# Patient Record
Sex: Female | Born: 1945 | Race: Black or African American | Hispanic: No | Smoking: Never smoker
Health system: Southern US, Community
[De-identification: ages and names within clinical notes are randomized; demographics above are authoritative.]

## PROBLEM LIST (undated history)

## (undated) DIAGNOSIS — E669 Obesity, unspecified: Secondary | ICD-10-CM

## (undated) DIAGNOSIS — R931 Abnormal findings on diagnostic imaging of heart and coronary circulation: Secondary | ICD-10-CM

## (undated) DIAGNOSIS — I10 Essential (primary) hypertension: Secondary | ICD-10-CM

## (undated) DIAGNOSIS — I471 Supraventricular tachycardia: Secondary | ICD-10-CM

## (undated) DIAGNOSIS — I4719 Other supraventricular tachycardia: Secondary | ICD-10-CM

## (undated) DIAGNOSIS — J45909 Unspecified asthma, uncomplicated: Secondary | ICD-10-CM

## (undated) DIAGNOSIS — F32A Depression, unspecified: Secondary | ICD-10-CM

## (undated) DIAGNOSIS — T7840XA Allergy, unspecified, initial encounter: Secondary | ICD-10-CM

## (undated) DIAGNOSIS — I509 Heart failure, unspecified: Secondary | ICD-10-CM

## (undated) DIAGNOSIS — I429 Cardiomyopathy, unspecified: Secondary | ICD-10-CM

## (undated) DIAGNOSIS — F329 Major depressive disorder, single episode, unspecified: Secondary | ICD-10-CM

## (undated) DIAGNOSIS — E785 Hyperlipidemia, unspecified: Secondary | ICD-10-CM

## (undated) DIAGNOSIS — G4733 Obstructive sleep apnea (adult) (pediatric): Secondary | ICD-10-CM

## (undated) DIAGNOSIS — K635 Polyp of colon: Secondary | ICD-10-CM

## (undated) HISTORY — DX: Unspecified asthma, uncomplicated: J45.909

## (undated) HISTORY — DX: Heart failure, unspecified: I50.9

## (undated) HISTORY — DX: Polyp of colon: K63.5

## (undated) HISTORY — DX: Obstructive sleep apnea (adult) (pediatric): G47.33

## (undated) HISTORY — DX: Depression, unspecified: F32.A

## (undated) HISTORY — DX: Major depressive disorder, single episode, unspecified: F32.9

## (undated) HISTORY — DX: Essential (primary) hypertension: I10

## (undated) HISTORY — DX: Allergy, unspecified, initial encounter: T78.40XA

## (undated) HISTORY — DX: Supraventricular tachycardia: I47.1

## (undated) HISTORY — DX: Other supraventricular tachycardia: I47.19

## (undated) HISTORY — DX: Abnormal findings on diagnostic imaging of heart and coronary circulation: R93.1

## (undated) HISTORY — DX: Hyperlipidemia, unspecified: E78.5

## (undated) HISTORY — DX: Obesity, unspecified: E66.9

## (undated) HISTORY — DX: Cardiomyopathy, unspecified: I42.9

---

## 1965-01-13 HISTORY — PX: LEFT OOPHORECTOMY: SHX1961

## 1965-01-13 HISTORY — PX: TUBAL LIGATION: SHX77

## 1998-06-12 ENCOUNTER — Other Ambulatory Visit: Payer: Self-pay | Admitting: Cardiology

## 1998-06-13 ENCOUNTER — Other Ambulatory Visit: Payer: Self-pay | Admitting: Cardiology

## 2003-02-08 ENCOUNTER — Encounter: Payer: Self-pay | Admitting: Cardiology

## 2008-10-03 ENCOUNTER — Ambulatory Visit: Payer: Self-pay | Admitting: Occupational Medicine

## 2008-10-03 DIAGNOSIS — J45909 Unspecified asthma, uncomplicated: Secondary | ICD-10-CM | POA: Insufficient documentation

## 2008-10-03 DIAGNOSIS — I10 Essential (primary) hypertension: Secondary | ICD-10-CM

## 2008-12-15 ENCOUNTER — Ambulatory Visit: Payer: Self-pay | Admitting: Family Medicine

## 2008-12-15 DIAGNOSIS — J309 Allergic rhinitis, unspecified: Secondary | ICD-10-CM | POA: Insufficient documentation

## 2008-12-15 DIAGNOSIS — I509 Heart failure, unspecified: Secondary | ICD-10-CM | POA: Insufficient documentation

## 2008-12-15 DIAGNOSIS — Z780 Asymptomatic menopausal state: Secondary | ICD-10-CM | POA: Insufficient documentation

## 2008-12-15 DIAGNOSIS — H918X9 Other specified hearing loss, unspecified ear: Secondary | ICD-10-CM

## 2008-12-15 DIAGNOSIS — E669 Obesity, unspecified: Secondary | ICD-10-CM

## 2008-12-27 ENCOUNTER — Encounter: Payer: Self-pay | Admitting: Family Medicine

## 2009-01-18 ENCOUNTER — Encounter: Payer: Self-pay | Admitting: Family Medicine

## 2009-02-09 ENCOUNTER — Encounter: Payer: Self-pay | Admitting: Family Medicine

## 2009-03-15 ENCOUNTER — Ambulatory Visit: Payer: Self-pay | Admitting: Family Medicine

## 2009-04-10 ENCOUNTER — Encounter: Admission: RE | Admit: 2009-04-10 | Discharge: 2009-04-10 | Payer: Self-pay | Admitting: Family Medicine

## 2009-04-27 ENCOUNTER — Encounter: Payer: Self-pay | Admitting: Family Medicine

## 2009-07-10 ENCOUNTER — Ambulatory Visit: Payer: Self-pay | Admitting: Family

## 2009-07-10 ENCOUNTER — Telehealth (INDEPENDENT_AMBULATORY_CARE_PROVIDER_SITE_OTHER): Payer: Self-pay | Admitting: *Deleted

## 2009-07-10 ENCOUNTER — Telehealth: Payer: Self-pay | Admitting: Family

## 2009-07-10 DIAGNOSIS — E785 Hyperlipidemia, unspecified: Secondary | ICD-10-CM

## 2009-07-10 DIAGNOSIS — F329 Major depressive disorder, single episode, unspecified: Secondary | ICD-10-CM

## 2009-07-10 DIAGNOSIS — F3289 Other specified depressive episodes: Secondary | ICD-10-CM | POA: Insufficient documentation

## 2009-07-10 LAB — CONVERTED CEMR LAB
ALT: 10 units/L (ref 0–35)
AST: 17 units/L (ref 0–37)
CO2: 25 meq/L (ref 19–32)
Calcium: 9.4 mg/dL (ref 8.4–10.5)
Creatinine, Ser: 0.67 mg/dL (ref 0.40–1.20)
HDL: 69 mg/dL (ref >39)
Potassium: 4.2 meq/L (ref 3.5–5.3)
Total Protein: 7.9 g/dL (ref 6.0–8.3)
Triglycerides: 42 mg/dL (ref <150)
VLDL: 8 mg/dL (ref 0–40)

## 2009-07-11 ENCOUNTER — Encounter: Payer: Self-pay | Admitting: Family

## 2009-07-31 ENCOUNTER — Ambulatory Visit: Payer: Self-pay | Admitting: Family

## 2009-10-18 ENCOUNTER — Encounter: Payer: Self-pay | Admitting: Family Medicine

## 2009-12-04 ENCOUNTER — Ambulatory Visit: Payer: Self-pay | Admitting: Family Medicine

## 2009-12-04 DIAGNOSIS — J45901 Unspecified asthma with (acute) exacerbation: Secondary | ICD-10-CM | POA: Insufficient documentation

## 2010-02-12 NOTE — Letter (Signed)
Summary: Unable to Contact Patient/[facility]  Unable to Contact Patient/[facility]   Imported By: Lanelle Bal 11/02/2009 16:10:13  _____________________________________________________________________  External Attachment:    Type:   Image     Comment:   External Document

## 2010-02-12 NOTE — Progress Notes (Signed)
Summary: Diagnosis code for lab work  Phone Note From Other Clinic   Caller: [facility] lab Call For: Aldous Housel Summary of Call: Darl Pikes in lab called and all codes given for this patient BMP you had them draw are not passing she has [facility] Medicare and says they are picky of what diagnosis to use. Please give more diagnosis Initial call taken by: Kathlene November,  July 10, 2009 2:46 PM  Follow-up for Phone Call        Per lab [employer] will not cover BNP with diagnosis code of CHF. Follow-up by: Lemont Fillers FNP,  July 10, 2009 3:29 PM

## 2010-02-12 NOTE — Consult Note (Signed)
Summary: [facility]  [facility]   Imported By: Lanelle Bal 02/15/2009 10:57:50  _____________________________________________________________________  External Attachment:    Type:   Image     Comment:   External Document

## 2010-02-12 NOTE — Consult Note (Signed)
Summary: [facility]  [facility]   Imported By: Lanelle Bal 03/08/2009 13:09:22  _____________________________________________________________________  External Attachment:    Type:   Image     Comment:   External Document

## 2010-02-12 NOTE — Assessment & Plan Note (Signed)
Summary: BP check  Nurse Visit   Vital Signs:  Patient profile:   65 year old female Pulse rate:   63 / minute BP sitting:   125 / 72  (left arm) Cuff size:   large  Vitals Entered By: Kathlene November (July 31, 2009 1:17 PM) CC: Follow-up HTN HPI: Taking Meds?yes Side Effects?no Chest Pain, SOB, Dizziness?no A/P: HTN(401.1) At Goal? If no, MD will be notified. Follow-up in--  5 minutes was spent with the pt.  Allergies: No Known Drug Allergies  Orders Added: 1)  Est. Patient Level I [[address]]    Impression & Recommendations:  Problem # 1:  HYPERTENSION (ICD-401.9) BP looks great on current meds.   Her updated medication list for this problem includes:    Carvedilol 12.5 Mg Tabs (Carvedilol) .Marland Kitchen... 1 tab by mouth two times a day    Furosemide 20 Mg Tabs (Furosemide) .Marland Kitchen... 1 tab by mouth qd    Spironolactone 25 Mg Tabs (Spironolactone) .Marland Kitchen... Take one tablet by mouth once a day  Complete Medication List: 1)  Advair Diskus 250-50 Mcg/dose Aepb (Fluticasone-salmeterol) .... One puff two times a day 2)  Proair Hfa 108 (90 Base) Mcg/act Aers (Albuterol sulfate) .... As needed 3)  Carvedilol 12.5 Mg Tabs (Carvedilol) .Marland Kitchen.. 1 tab by mouth two times a day 4)  Furosemide 20 Mg Tabs (Furosemide) .Marland Kitchen.. 1 tab by mouth qd 5)  Simvastatin 20 Mg Tabs (Simvastatin) .... Take 1 by mouth at bedtime 6)  Loratadine 10 Mg Tabs (Loratadine) .Marland Kitchen.. 1 tab by mouth daily for allergies 7)  Spironolactone 25 Mg Tabs (Spironolactone) .... Take one tablet by mouth once a day

## 2010-02-12 NOTE — Assessment & Plan Note (Signed)
Summary: asthma / allergies   Vital Signs:  Patient profile:   65 year old female Height:      67 inches Weight:      250 pounds BMI:     39.30 O2 Sat:      100 % on Room air Temp:     98.6 degrees F oral Pulse rate:   74 / minute BP sitting:   114 / 70  (left arm) Cuff size:   large  Vitals Entered By: Payton Spark CMA (December 04, 2009 11:02 AM)  O2 Flow:  Room air CC: F/U.    Primary Care Provider:  Seymour Bars DO  CC:  F/U. Marland Kitchen  History of Present Illness: 65 yo AAF presents for f/u visit and flare up of her allergies.  she is using OTC allarest or benadyl with rare use of Loratidine.  She thinks that for the past month, her allergies and asthma has flared after moving to a new house.  The former owners had a cat and she has had her carpets cleaned but it has not helped.  She has had more congestion, dry cough, itchy watery eyes and SOB.  Reports compliance with her Advair and is rarely using her ProAir.  She also has CHF and is overdue to see cardiology back.  She is doing well on her current meds and her labs are UTD.  Denies chest pain, leg swelling, etc.    Current Medications (verified): 1)  Advair Diskus 250-50 Mcg/dose Aepb (Fluticasone-Salmeterol) .... One Puff Two Times A Day 2)  Proair Hfa 108 (90 Base) Mcg/act Aers (Albuterol Sulfate) .... As Needed 3)  Carvedilol 12.5 Mg Tabs (Carvedilol) .Marland Kitchen.. 1 Tab By Mouth Two Times A Day 4)  Furosemide 20 Mg Tabs (Furosemide) .Marland Kitchen.. 1 Tab By Mouth Qd 5)  Simvastatin 20 Mg Tabs (Simvastatin) .... Take 1 By Mouth At Bedtime 6)  Loratadine 10 Mg Tabs (Loratadine) .Marland Kitchen.. 1 Tab By Mouth Daily For Allergies 7)  Spironolactone 25 Mg Tabs (Spironolactone) .... Take One Tablet By Mouth Once A Day  Allergies (verified): No Known Drug Allergies  Past History:  Past Medical History: Asthma/ Allergies Hypertension CHF (Dr Eustace Quail) Obesity Depression  Social History: Reviewed history from 12/15/2008 and no changes  required. Retired in 2001 from [employer].  Previously divorced and widowed.  Not in any relationships now. Never smoked.  Denies ETOH. No exercise, fair diet. 46 yo grandson lives w/ her.  Review of Systems      See HPI  Physical Exam  General:  alert, well-developed, well-nourished, and well-hydrated.  obese Head:  normocephalic and atraumatic.   Eyes:  conjunctiva clear , no lid edema but eyes are watery Ears:  EACs patent; TMs translucent and gray with good cone of light and bony landmarks.  Nose:  boggy turbinates with nasal congestion Mouth:  o/p mildly injected Neck:  no masses.   Lungs:  prolonged expiratory phase with scant exp wheezing at the bases.  non labored. no rhonchi or rales Heart:  Normal rate and regular rhythm. S1 and S2 normal without gallop, murmur, click, rub or other extra sounds. Pulses:  2+ radial pulses Extremities:  1+ left pedal edema and 1+ right pedal edema.   Skin:  color normal.   Cervical Nodes:  No lymphadenopathy noted Psych:  flat affect.     Impression & Recommendations:  Problem # 1:  EXTRINSIC ASTHMA, WITH EXACERBATION (ICD-493.02) Mild asthma flare up secondary to allergies (likely from cat dander in  new house).  Will treat iwth Solumedrol 125 mg today followed by 7 days of Prednisone.  She is to stay on Advair with as needed use of ProAir.  Will update her spirometry thru [facility] given her allergy triggers. Her updated medication list for this problem includes:    Advair Diskus 250-50 Mcg/dose Aepb (Fluticasone-salmeterol) ..... One puff two times a day    Proair Hfa 108 (90 Base) Mcg/act Aers (Albuterol sulfate) .Marland Kitchen... As needed    Prednisone 20 Mg Tabs (Prednisone) .Marland Kitchen... 2 tabs by mouth once daily x 7 days  Orders: Allergy Referral  (Allergy)  Problem # 2:  ALLERGIC RHINITIS CAUSE UNSPECIFIED (ICD-477.9) Flare up of allergies/ asthma likely from cat dander.  Solumedrol 125 mg IM today followed by 7 days of oral prednisone  and stay on Claritin daily.  Referral made to [facility].  Likely will need allergy testing and PFTs done. Her updated medication list for this problem includes:    Loratadine 10 Mg Tabs (Loratadine) .Marland Kitchen... 1 tab by mouth daily for allergies  Orders: Allergy Referral  (Allergy) Admin of Therapeutic Inj  intramuscular or subcutaneous ([address])  Problem # 3:  CHF (ICD-428.0) She is overdue for f/u with [facility].  She is overdue for an echo.  Will get her scheduled for f/u with them.  RFd her meds today.  Labs are UTD. Her updated medication list for this problem includes:    Carvedilol 12.5 Mg Tabs (Carvedilol) .Marland Kitchen... 1 tab by mouth two times a day    Furosemide 20 Mg Tabs (Furosemide) .Marland Kitchen... 1 tab by mouth qd    Spironolactone 25 Mg Tabs (Spironolactone) .Marland Kitchen... Take one tablet by mouth once a day  Problem # 4:  OBESITY, UNSPECIFIED (ICD-278.00) BMI 39 c/w class II obesity. She does not seem interested in wt loss and has co- morbidities of CHF, asthma, HTN, dyslipidemia and depression.  Complete Medication List: 1)  Advair Diskus 250-50 Mcg/dose Aepb (Fluticasone-salmeterol) .... One puff two times a day 2)  Proair Hfa 108 (90 Base) Mcg/act Aers (Albuterol sulfate) .... As needed 3)  Carvedilol 12.5 Mg Tabs (Carvedilol) .Marland Kitchen.. 1 tab by mouth two times a day 4)  Furosemide 20 Mg Tabs (Furosemide) .Marland Kitchen.. 1 tab by mouth qd 5)  Simvastatin 20 Mg Tabs (Simvastatin) .... Take 1 by mouth at bedtime 6)  Loratadine 10 Mg Tabs (Loratadine) .Marland Kitchen.. 1 tab by mouth daily for allergies 7)  Spironolactone 25 Mg Tabs (Spironolactone) .... Take one tablet by mouth once a day 8)  Prednisone 20 Mg Tabs (Prednisone) .... 2 tabs by mouth once daily x 7 days  Other Orders: Solumedrol up to 125mg  (U0454)  Patient Instructions: 1)  Referral made to [facility] and All[facility]ies. 2)  Stay on ADVAIR 2 x a day 3)  use PROAIR 2 puffs 4 x a day for the next wk, then go to as needed. 4)  Take OTC  Zyrtec in the evenings for allergies instead of other anti histamines. 5)  Steroid shot given today.  Follow this with 7 days of Prednisone 40 mg once daily x 7 days-- start tomorrow. 6)  Return for f/u visit in 4 mos. Prescriptions: PREDNISONE 20 MG TABS (PREDNISONE) 2 tabs by mouth once daily x 7 days  #14 x 0   Entered and Authorized by:   Seymour Bars DO   Signed by:   Seymour Bars DO on 12/04/2009   Method used:   Electronically to  [facility] [address] [phone]* (retail)       [address] [place], [place]  [address]       Ph: [phone]       Fax: [phone]   RxID:   [id number] SPIRONOLACTONE 25 MG TABS (SPIRONOLACTONE) Take one tablet by mouth once a day  #30 x 6   Entered and Authorized by:   Seymour Bars DO   Signed by:   Seymour Bars DO on 12/04/2009   Method used:   Electronically to        [facility] [phone]* (retail)       [address] [place], [place]  [address]       Ph: [phone]       Fax: [phone]   RxID:   [id number] SIMVASTATIN 20 MG TABS (SIMVASTATIN) Take 1 by mouth at bedtime  #30 x 6   Entered and Authorized by:   Seymour Bars DO   Signed by:   Seymour Bars DO on 12/04/2009   Method used:   Electronically to        [facility] [phone]* (retail)       [address] [place], [place]  [address]       Ph: [phone]       Fax: [phone]   RxID:   [id number] FUROSEMIDE 20 MG TABS (FUROSEMIDE) 1 tab by mouth qd  #30 x 6   Entered and Authorized by:   Seymour Bars DO   Signed by:   Seymour Bars DO on 12/04/2009   Method used:   Electronically to        [facility] [phone]* (retail)       [address] [place], [place]  [address]       Ph: [phone]       Fax: [phone]   RxID:   [id number] CARVEDILOL 12.5 MG TABS (CARVEDILOL) 1 tab by mouth two times a day  #60 x 6   Entered and Authorized by:   Seymour Bars DO   Signed by:   Seymour Bars DO on 12/04/2009   Method used:   Electronically to         [facility] [phone]* (retail)       [address] [place], [place]  [address]       Ph: [phone]       Fax: [phone]   RxID:   [id number] PROAIR HFA 108 (90 BASE) MCG/ACT AERS (ALBUTEROL SULFATE) As needed  #2 x 1   Entered and Authorized by:   Seymour Bars DO   Signed by:   Seymour Bars DO on 12/04/2009   Method used:   Electronically to        [facility] [phone]* (retail)       [address] [place], [place]  [address]       Ph: [phone]       Fax: [phone]   RxID:   [id number] ADVAIR DISKUS 250-50 MCG/DOSE AEPB (FLUTICASONE-SALMETEROL) One puff two times a day  #1 diskus x 6   Entered and Authorized by:   Seymour Bars DO   Signed by:   Seymour Bars DO on 12/04/2009   Method used:   Electronically to  [address] [phone]* (retail)       [address] [place], [place]  [address]       Ph: [phone]       Fax: [phone]   RxID:   [id number]    Medication Administration  Injection # 1:    Medication: Solumedrol up to 125mg     Diagnosis: ALLERGIC RHINITIS CAUSE UNSPECIFIED (ICD-477.9)    Route: IM    Site: LUOQ gluteus    Exp Date: 03/2012    Lot #: [id number]    Patient tolerated injection without complications    Given by: Payton Spark CMA (December 04, 2009 11:31 AM)  Orders Added: 1)  Allergy Referral  [Allergy] 2)  Solumedrol up to 125mg  [J2930] 3)  Admin of Therapeutic Inj  intramuscular or subcutaneous [96372] 4)  Est. Patient Level IV [40347]     Medication Administration  Injection # 1:    Medication: Solumedrol up to 125mg     Diagnosis: ALLERGIC RHINITIS CAUSE UNSPECIFIED (ICD-477.9)    Route: IM    Site: LUOQ gluteus    Exp Date: 03/2012    Lot #: [id number]    Patient tolerated injection without complications    Given by: Payton Spark CMA (December 04, 2009 11:31 AM)  Orders Added: 1)  Allergy Referral  [Allergy] 2)  Solumedrol up to 125mg  [J2930] 3)  Admin of Therapeutic Inj  intramuscular or  subcutaneous [96372] 4)  Est. Patient Level IV [42595]
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
## 2010-02-12 NOTE — Letter (Signed)
   [facility] [address] [place], [place]  [address]  [place] Phone: [phone]      July 11, 2009   Loran Pepitone [address] [place], [place] [address]  RE:  LAB RESULTS  Dear  Ms. Cooner,  The following is an interpretation of your most recent lab tests.  Please take note of any instructions provided or changes to medications that have resulted from your lab work.  ELECTROLYTES:  Good - no changes needed  KIDNEY FUNCTION TESTS:  Good - no changes needed  LIVER FUNCTION TESTS:  Good - no changes needed  LIPID PANEL:  Good - no changes needed Triglyceride: 42   Cholesterol: 196   LDL: 119   HDL: 69   Chol/HDL%:  2.8 Ratio  DIABETIC STUDIES:  Excellent - no changes needed Blood Glucose: 92     Sincerely Yours,    Lemont Fillers FNP  Appended Document:  Letter mailed.

## 2010-02-12 NOTE — Letter (Signed)
Summary: Appt Rescheduled/[facility]  Appt Rescheduled/[facility]   Imported By: Lanelle Bal 05/03/2009 13:26:16  _____________________________________________________________________  External Attachment:    Type:   Image     Comment:   External Document

## 2010-02-12 NOTE — Progress Notes (Signed)
  Phone Note Outgoing Call   Summary of Call: Please call patient and verify dose of spironolactone that she has been taking most recently,  Dr. Tommye Standard records from last summer indicated that she was only taking 1/2 tablet of the 25 mg tab of spironlolactone.  If this is the case- she should continue with the half tab only. Thanks Initial call taken by: Lemont Fillers FNP,  July 10, 2009 4:52 PM  Follow-up for Phone Call        tried to call pt at 8:14am- message states VM can not accept messages at this time Follow-up by: Kathlene November,  July 11, 2009 8:15 AM  Additional Follow-up for Phone Call Additional follow up Details #1::        Pt states she has been taking 25mg  1 tab once daily bc the tabs are to hard to split.   Additional Follow-up by: Payton Spark CMA,  July 11, 2009 1:24 PM    Additional Follow-up for Phone Call Additional follow up Details #2::    OK for her to continue the 25mg  of spironolactone then, pls advise pt. Follow-up by: Lemont Fillers FNP,  July 11, 2009 3:54 PM

## 2010-05-16 ENCOUNTER — Encounter: Payer: Self-pay | Admitting: Family Medicine

## 2010-05-17 ENCOUNTER — Ambulatory Visit: Payer: Self-pay | Admitting: Family Medicine

## 2010-05-21 ENCOUNTER — Ambulatory Visit (INDEPENDENT_AMBULATORY_CARE_PROVIDER_SITE_OTHER): Payer: Medicare Other | Admitting: Family Medicine

## 2010-05-21 ENCOUNTER — Encounter: Payer: Self-pay | Admitting: Family Medicine

## 2010-05-21 DIAGNOSIS — J45909 Unspecified asthma, uncomplicated: Secondary | ICD-10-CM

## 2010-05-21 DIAGNOSIS — I509 Heart failure, unspecified: Secondary | ICD-10-CM

## 2010-05-21 DIAGNOSIS — E669 Obesity, unspecified: Secondary | ICD-10-CM

## 2010-05-21 DIAGNOSIS — I10 Essential (primary) hypertension: Secondary | ICD-10-CM

## 2010-05-21 NOTE — Assessment & Plan Note (Signed)
She needs to f/u with [facility] in [place] and agrees to call for an appt.  Weight loss is certainly important for her CHF.

## 2010-05-21 NOTE — Progress Notes (Signed)
  Subjective:    Patient ID: Briana Silva, female    DOB: 02-06-1945, 65 y.o.   MRN: [id number]  HPI 65 yo AAF presents for f/u visit.  I saw her in Nov and she has yet to go bck to the allergist or the cardiologist.  Her allergies have been bad but Zyrtec is working.  She is not needing her Albuterol HFA > 1 x a wk.  She is just getting over a cold.    Denies chest pain.  Has mild leg edema.  Denies DOE.  She is not taking her lasix everyday when she is out on the town.    She would like to lose weight and is feeling more motivated to do so.  Her BMI is up to 40 now in the class III obesity range.  She has not yet made any changes and has yet to start exercising.  BP 127/84  Pulse 93  Ht 5\' 7"  (1.702 m)  Wt 256 lb (116.121 kg)  BMI 40.10 kg/m2  SpO2 97%     Review of Systems  Denies chest pain, DOE.  Mild unchanged leg swellling.  Denies voiding problems or heart palpitations.    Objective:   Physical Exam  Constitutional: She appears well-developed and well-nourished. No distress.       Morbidly obese  Eyes: Conjunctivae are normal. No scleral icterus.  Neck: Neck supple. No JVD present.  Cardiovascular: Normal rate, regular rhythm and normal heart sounds.   No murmur heard. Pulmonary/Chest: Effort normal. No respiratory distress. She has wheezes (RUL mild exp wheeze). She has no rales.  Musculoskeletal: She exhibits edema (1+ pitting bilat LE edema).  Lymphadenopathy:    She has no cervical adenopathy.  Skin: Skin is warm and dry.  Psychiatric: She has a normal mood and affect.          Assessment & Plan:

## 2010-05-21 NOTE — Assessment & Plan Note (Signed)
BMI up from 39--> 40, now in the class III range.  She is more motivated to lose wt.  I talked to her about doing WT watchers or the [employer] (h/o given from [employer]).  She is also to start exercising.  Goals given.  F/u in 1 month.

## 2010-05-21 NOTE — Patient Instructions (Signed)
BP looks great.  F/U with the cardiologist for CHF.  Try Weight Watchers or the Mediterranean diet + exercise goal of 45+ min 4-5 days/ wk.  Return for f/u weight (? EKG) in 1 month.

## 2010-05-21 NOTE — Assessment & Plan Note (Signed)
Improved.  Zyrtec is working for allergies and she is much improved.  Continue current plan.

## 2010-05-21 NOTE — Assessment & Plan Note (Signed)
BP is well controlled on current meds and labs are UTD, continue.

## 2010-06-21 ENCOUNTER — Encounter: Payer: Self-pay | Admitting: Family Medicine

## 2010-06-21 ENCOUNTER — Ambulatory Visit (INDEPENDENT_AMBULATORY_CARE_PROVIDER_SITE_OTHER): Payer: Medicare Other | Admitting: Family Medicine

## 2010-06-21 DIAGNOSIS — E669 Obesity, unspecified: Secondary | ICD-10-CM

## 2010-06-21 DIAGNOSIS — I10 Essential (primary) hypertension: Secondary | ICD-10-CM

## 2010-06-21 DIAGNOSIS — I509 Heart failure, unspecified: Secondary | ICD-10-CM

## 2010-06-21 NOTE — Assessment & Plan Note (Signed)
She is clinically stable on current meds but overdue for cardiology f/u which she thinks is with [facility] cardiology.  Will draw her fasting labs (if not done by cards) at her next visit in 3 mos.  Will proceed with sleep testing given her BMI >40, HTN and CHF.  She is not very motivated to make other changes at this time.  Her BP is stable.

## 2010-06-21 NOTE — Assessment & Plan Note (Signed)
BMI >40, wt unchanged from last month.  Not a candidate for phentermine given her heart dz hx.  In additon, she is not motivated to make necessary lifestyle changes.  Recommended wt watchers.

## 2010-06-21 NOTE — Progress Notes (Signed)
  Subjective:    Patient ID: Briana Silva, female    DOB: 06/18/1945, 65 y.o.   MRN: [id number]  HPI  65 yo AAF presents for f/u visit.  She is doing well on her meds.  She is due for cardiology f/u.  She has hx of CHF.  She has never had a sleep study.  Her BMI remains >40.  Her BP is well controlled.  She has cut back on sugar and white flour.  She has started to exercise more and is noticing early change in her clothes.  She is doing stretches.  She uses Advair for asthma and has been able to exercise w/o problems.  She is not really motivated to make any other changes at this time.  Denies CP or DOE.  BP 123/70  Pulse 73  Ht 5' 6.75" (1.695 m)  Wt 255 lb (115.667 kg)  BMI 40.24 kg/m2  SpO2 96%   Review of Systems  Constitutional: Negative for appetite change and fatigue.  Eyes: Negative for visual disturbance.  Respiratory: Negative for shortness of breath.   Cardiovascular: Negative for chest pain, palpitations and leg swelling.  Psychiatric/Behavioral: Negative for dysphoric mood. The patient is not nervous/anxious.        Objective:   Physical Exam  Constitutional: She appears well-developed and well-nourished. No distress.       obese  HENT:  Head: Normocephalic and atraumatic.  Neck: Neck supple. No thyromegaly present.  Cardiovascular: Normal rate, regular rhythm and normal heart sounds.   No murmur heard. Pulmonary/Chest: Effort normal and breath sounds normal. No respiratory distress. She has no rales.  Musculoskeletal: She exhibits edema (minimial nonpitting LE edema bilat).  Skin: Skin is warm and dry.  Psychiatric: She has a normal mood and affect.          Assessment & Plan:

## 2010-06-21 NOTE — Patient Instructions (Signed)
Stay on current meds.  Will schedule your sleep study in [place].  F/U with your cardiologist.   Return for PHYSICAL with PAP/ fasting labs in 3 mos.

## 2010-08-08 ENCOUNTER — Telehealth: Payer: Self-pay | Admitting: Family Medicine

## 2010-08-08 DIAGNOSIS — I509 Heart failure, unspecified: Secondary | ICD-10-CM

## 2010-08-08 DIAGNOSIS — G4733 Obstructive sleep apnea (adult) (pediatric): Secondary | ICD-10-CM | POA: Insufficient documentation

## 2010-08-08 NOTE — Telephone Encounter (Signed)
Pls let pt know that her sleep study shows mild obstructive sleep apena with moderate snoring and disrupted sleep.   I will have her seen Dr Gaetano Net in the office to discuss CPAP for treatment.  Weight loss is also important.

## 2010-08-08 NOTE — Telephone Encounter (Signed)
Referral put in but this may not be covered since she is not diabetic.  If it's not, she can attend the free class here.

## 2010-08-08 NOTE — Telephone Encounter (Signed)
Advised pt of sleep study results and rec. Re: wt loss pt states she doesn't want to do weight watchersdue to not having family support near by,etc. But would like to see a dietician/nutritionist.referral?

## 2010-09-04 ENCOUNTER — Other Ambulatory Visit: Payer: Self-pay | Admitting: Family Medicine

## 2010-09-13 ENCOUNTER — Encounter: Payer: Self-pay | Admitting: Family Medicine

## 2010-09-23 ENCOUNTER — Encounter: Payer: Medicare Other | Admitting: Family Medicine

## 2010-10-17 DIAGNOSIS — R635 Abnormal weight gain: Secondary | ICD-10-CM | POA: Insufficient documentation

## 2010-10-17 DIAGNOSIS — Z658 Other specified problems related to psychosocial circumstances: Secondary | ICD-10-CM | POA: Insufficient documentation

## 2010-10-18 ENCOUNTER — Ambulatory Visit (INDEPENDENT_AMBULATORY_CARE_PROVIDER_SITE_OTHER): Payer: Medicare Other | Admitting: Family Medicine

## 2010-10-18 ENCOUNTER — Encounter: Payer: Self-pay | Admitting: Family Medicine

## 2010-10-18 VITALS — BP 147/91 | HR 88 | Wt 263.0 lb

## 2010-10-18 DIAGNOSIS — E785 Hyperlipidemia, unspecified: Secondary | ICD-10-CM

## 2010-10-18 DIAGNOSIS — Z23 Encounter for immunization: Secondary | ICD-10-CM

## 2010-10-18 DIAGNOSIS — I509 Heart failure, unspecified: Secondary | ICD-10-CM

## 2010-10-18 DIAGNOSIS — Z1211 Encounter for screening for malignant neoplasm of colon: Secondary | ICD-10-CM

## 2010-10-18 DIAGNOSIS — Z1231 Encounter for screening mammogram for malignant neoplasm of breast: Secondary | ICD-10-CM

## 2010-10-18 DIAGNOSIS — Z0000 Encounter for general adult medical examination without abnormal findings: Secondary | ICD-10-CM

## 2010-10-18 NOTE — Patient Instructions (Addendum)
We will call you with your lab results.  Start a regular exercise program and make sure you are eating a healthy diet Try to eat 4 servings of dairy a day or take a calcium supplement (500mg  twice a day).

## 2010-10-18 NOTE — Progress Notes (Deleted)
  Subjective:    Patient ID: Briana Silva, female    DOB: Apr 23, 1945, 65 y.o.   MRN: [id number]  HPI    Review of Systems     Objective:   Physical Exam        Assessment & Plan:

## 2010-10-18 NOTE — Progress Notes (Signed)
  Subjective:     Briana Silva is a 65 y.o. female and is here for a comprehensive physical exam. The patient reports problems - she is doing well. Marland Kitchen  History   Social History  . Marital Status: Widowed    Spouse Name: N/A    Number of Children: 2  . Years of Education: N/A   Occupational History  . Retired.     Social History Main Topics  . Smoking status: Never Smoker   . Smokeless tobacco: Not on file  . Alcohol Use: No  . Drug Use: No  . Sexually Active:    Other Topics Concern  . Not on file   Social History Narrative   No regular exercise.  Going to a nutrition center.    Health Maintenance  Topic Date Due  . Tetanus/tdap  07/04/1962  . Colonoscopy  07/03/1993  . Zostavax  07/04/2003  . Pneumococcal Polysaccharide Vaccine Age 71 And Over  07/03/2008  . Mammogram  04/11/2011  . Influenza Vaccine  10/14/2011    The following portions of the patient's history were reviewed and updated as appropriate: allergies, current medications, past family history, past medical history, past social history and past surgical history.  Review of Systems A comprehensive review of systems was negative.   Objective:    BP 147/91  Pulse 88  Wt 263 lb (119.296 kg) General appearance: alert, cooperative and appears stated age Head: Normocephalic, without obvious abnormality, atraumatic Eyes: conjunctiva clear, EOMI, PEERLA Ears: normal TM's and external ear canals both ears Nose: Nares normal. Septum midline. Mucosa normal. No drainage or sinus tenderness. Throat: lips, mucosa, and tongue normal; teeth and gums normal Neck: no adenopathy, no carotid bruit, supple, symmetrical, trachea midline and thyroid not enlarged, symmetric, no tenderness/mass/nodules Back: symmetric, no curvature. ROM normal. No CVA tenderness. Lungs: clear to auscultation bilaterally Breasts: normal appearance, no masses or tenderness Heart: regular rate and rhythm, S1, S2 normal, no murmur, click, rub or  gallop Abdomen: soft, non-tender; bowel sounds normal; no masses,  no organomegaly Pelvic: cervix normal in appearance, external genitalia normal, no adnexal masses or tenderness, no cervical motion tenderness, rectovaginal septum normal, uterus normal size, shape, and consistency and vagina normal without discharge Extremities: extremities normal, atraumatic, no cyanosis or edema Pulses: 2+ and symmetric Skin: Skin color, texture, turgor normal. No rashes or lesions Lymph nodes: Cervical, supraclavicular, and axillary nodes normal. Neurologic: Grossly normal    Assessment:    Healthy female exam.      Plan:     See After Visit Summary for Counseling Recommendations  Start a regular exercise program and make sure you are eating a healthy diet Try to eat 4 servings of dairy a day or take a calcium supplement (500mg  twice a day). Your vaccines are up to date.  Refer for mammogram and screening colonoscopy. I reviewed her vaccines with her. Seh thinks may have had her tetanus and pneumonia vaccines. seh doesn't have medicare part D so will hold off on shingles vaccine.

## 2010-10-19 LAB — COMPLETE METABOLIC PANEL WITH GFR
ALT: 10 U/L (ref 0–35)
Albumin: 4.2 g/dL (ref 3.5–5.2)
BUN: 12 mg/dL (ref 6–23)
CO2: 28 mEq/L (ref 19–32)
Creat: 0.78 mg/dL (ref 0.50–1.10)
GFR, Est African American: >60 mL/min (ref >60)
Glucose, Bld: 97 mg/dL (ref 70–99)
Sodium: 140 mEq/L (ref 135–145)

## 2010-10-19 LAB — LIPID PANEL
Cholesterol: 164 mg/dL (ref 0–200)
VLDL: 10 mg/dL (ref 0–40)

## 2010-10-21 ENCOUNTER — Telehealth: Payer: Self-pay | Admitting: *Deleted

## 2010-10-21 NOTE — Telephone Encounter (Signed)
Message copied by Lanae Crumbly on Mon Oct 21, 2010  9:34 AM ------      Message from: Nani Gasser D      Created: Sat Oct 19, 2010 11:30 AM       Cholesterol and CMP looks great!!

## 2010-10-21 NOTE — Telephone Encounter (Signed)
Pt notified of results. KJ LPN 

## 2010-11-20 ENCOUNTER — Ambulatory Visit: Payer: Medicare Other | Admitting: Family Medicine

## 2010-12-11 ENCOUNTER — Emergency Department
Admission: EM | Admit: 2010-12-11 | Discharge: 2010-12-11 | Disposition: A | Discharge status: Home / Self Care | Payer: Medicare Other | Source: Home / Self Care | Attending: Family Medicine | Admitting: Family Medicine

## 2010-12-11 ENCOUNTER — Encounter: Payer: Self-pay | Admitting: *Deleted

## 2010-12-11 DIAGNOSIS — J069 Acute upper respiratory infection, unspecified: Secondary | ICD-10-CM

## 2010-12-11 MED ORDER — BENZONATATE 200 MG PO CAPS: 200.0000 mg | ORAL_CAPSULE | Freq: Every day | ORAL | Status: AC

## 2010-12-11 MED ORDER — AMOXICILLIN 875 MG PO TABS: 875.0000 mg | ORAL_TABLET | Freq: Two times a day (BID) | ORAL | Status: AC

## 2010-12-11 NOTE — ED Notes (Signed)
After first attempt of blood draw with no success in left hand, pt. Declined any other attempts, stating had Colonoscopy this am and "is dry"./TM

## 2010-12-11 NOTE — ED Provider Notes (Signed)
History     CSN: [id number] Arrival date & time: 12/11/2010  1:46 PM   First MD Initiated Contact with Patient 12/11/10 1403      Chief Complaint  Patient presents with  . URI  . Cough     HPI Comments: Patient complains of approximately 6 day history of gradually progressive URI symptoms beginning with a mild sore throat (now improved), followed by progressive nasal congestion.  A cough started about 4 days ago.  Complains of fatigue and initial myalgias.  Cough is now worse at night and generally non-productive during the day.  She has had no pleuritic pain, but complains of shortness of breath and wheezes.  She has asthma and has been using her inhaler more frequently with marginal improvement.  She is also using her albuterol nebulizer. She states that she had her flu shot in October.  She notes that she sometimes coughs until she gags. She was preparing to undergo colonoscopy today, but the procedure was cancelled because of hypoxia.  She feels better now  Patient is a 65 y.o. female presenting with URI. The history is provided by the patient.  URI The primary symptoms include fever, fatigue, headaches, ear pain, sore throat, cough, wheezing and myalgias. Primary symptoms do not include swollen glands, abdominal pain, nausea, vomiting, arthralgias or rash. The current episode started 6 to 7 days ago. This is a new problem.  The headache is associated with eye pain.  The sore throat is not accompanied by trouble swallowing.  Symptoms associated with the illness include chills and congestion. The illness is not associated with sinus pressure. Risk factors for severe complications from URI include being elderly (History of pneumonia).    Past Medical History  Diagnosis Date  . Allergy   . Asthma   . CHF (congestive heart failure)   . Depression   . Obesity     Past Surgical History  Procedure Date  . Tubal ligation 1967  . Left oophorectomy 1967    Family History  Problem  Relation Age of Onset  . Pancreatic cancer Father   . Hypertension Mother   . Hypertension Father   . Diabetes Brother   . Cancer Daughter     Brain tumor    History  Substance Use Topics  . Smoking status: Never Smoker   . Smokeless tobacco: Not on file  . Alcohol Use: No    OB History    Grav Para Term Preterm Abortions TAB SAB Ect Mult Living                  Review of Systems  Constitutional: Positive for fever, chills, activity change and fatigue.  HENT: Positive for ear pain, congestion and sore throat. Negative for trouble swallowing and sinus pressure.   Eyes: Positive for pain.  Respiratory: Positive for cough and wheezing. Negative for shortness of breath.   Cardiovascular: Negative.   Gastrointestinal: Negative.  Negative for nausea, vomiting and abdominal pain.  Genitourinary: Negative.   Musculoskeletal: Positive for myalgias. Negative for arthralgias.  Skin: Negative.  Negative for rash.  Neurological: Positive for headaches.    Allergies  Review of patient's allergies indicates no known allergies.  Home Medications   Current Outpatient Rx  Name Route Sig Dispense Refill  . ALBUTEROL SULFATE HFA 108 (90 BASE) MCG/ACT IN AERS Inhalation Inhale 2 puffs into the lungs every 6 (six) hours as needed.      . AMOXICILLIN 875 MG PO TABS Oral Take 1 tablet (  875 mg total) by mouth 2 (two) times daily. 14 tablet 0  . BENZONATATE 200 MG PO CAPS Oral Take 1 capsule (200 mg total) by mouth at bedtime. Take as needed for cough 12 capsule 0  . CARVEDILOL 12.5 MG PO TABS  TAKE ONE TABLET BY MOUTH TWICE DAILY 60 tablet 2  . CETIRIZINE HCL 10 MG PO TABS Oral Take 10 mg by mouth daily.      Marland Kitchen FLUTICASONE-SALMETEROL 250-50 MCG/DOSE IN AEPB Inhalation Inhale 1 puff into the lungs every 12 (twelve) hours.      . FUROSEMIDE 20 MG PO TABS  TAKE ONE TABLET BY MOUTH EVERY DAY 30 tablet 2  . SIMVASTATIN 20 MG PO TABS  TAKE ONE TABLET BY MOUTH EVERY DAY AT BEDTIME 30 tablet 2  .  SPIRONOLACTONE 25 MG PO TABS  TAKE ONE TABLET BY MOUTH EVERY DAY 30 tablet 2    BP 151/95  Pulse 89  Temp(Src) 98 F (36.7 C) (Oral)  Resp 18  Ht 5\' 7"  (1.702 m)  Wt 262 lb (118.842 kg)  BMI 41.03 kg/m2  SpO2 95%  Physical Exam  Nursing note and vitals reviewed. Constitutional: She is oriented to person, place, and time. She appears well-developed and well-nourished. No distress.       Patient is obese (BMI 41.1)   HENT:  Head: Normocephalic and atraumatic.  Right Ear: Tympanic membrane and external ear normal.  Left Ear: Tympanic membrane and external ear normal.  Nose: Nose normal.  Mouth/Throat: Oropharynx is clear and moist. No oropharyngeal exudate.  Eyes: Conjunctivae are normal. Pupils are equal, round, and reactive to light. Right eye exhibits no discharge. Left eye exhibits no discharge.  Neck: Neck supple.  Cardiovascular: Normal rate, regular rhythm and normal heart sounds.   Pulmonary/Chest: Effort normal. No respiratory distress. She has no wheezes. She has rhonchi. She has no rales. She exhibits no tenderness.  Abdominal: Soft. There is no tenderness.  Musculoskeletal: She exhibits no edema and no tenderness.  Lymphadenopathy:    She has no cervical adenopathy.  Neurological: She is alert and oriented to person, place, and time.  Skin: Skin is warm and dry.    ED Course  Procedures  none      1. Acute upper respiratory infections of unspecified site       MDM   ? Bronchitis With history of pneumonia, will begin amoxicillin.  Also begin cough suppressant at bedtime.  Take Mucinex  (guaifenesin) twice daily for congestion.  Increase fluid intake, rest. May use Afrin nasal spray (or generic oxymetazoline) twice daily for about 5 days.  Also recommend using saline nasal spray several times daily and/or saline nasal irrigation. Stop all antihistamines for now, and other non-prescription cough/cold preparations. Continue inhalers. Follow-up with family  doctor if not improving 7 days.  Recommend postponing colonoscopy for about two weeks until completely well.       Donna Christen, MD 12/15/10 [phone]

## 2010-12-11 NOTE — ED Notes (Signed)
Patient c/o cough, wheezing, right ear pain and sinus drainage x 1 week. She has used her inhaler @ home and taken benadryl. Today she went for a colonoscopy and reported the procedure had to be stopped because her 02 level kept dropping too low.

## 2010-12-12 ENCOUNTER — Encounter: Payer: Self-pay | Admitting: Family Medicine

## 2010-12-26 ENCOUNTER — Other Ambulatory Visit: Payer: Self-pay | Admitting: *Deleted

## 2010-12-26 MED ORDER — SIMVASTATIN 20 MG PO TABS: 20.0000 mg | ORAL_TABLET | Freq: Every day | ORAL | Status: DC

## 2010-12-26 MED ORDER — SPIRONOLACTONE 25 MG PO TABS: 25.0000 mg | ORAL_TABLET | Freq: Every day | ORAL | Status: DC

## 2010-12-26 MED ORDER — ALBUTEROL SULFATE HFA 108 (90 BASE) MCG/ACT IN AERS: 2.0000 | INHALATION_SPRAY | Freq: Four times a day (QID) | RESPIRATORY_TRACT | Status: DC | PRN

## 2010-12-26 MED ORDER — FUROSEMIDE 20 MG PO TABS: 20.0000 mg | ORAL_TABLET | Freq: Every day | ORAL | Status: DC

## 2010-12-26 MED ORDER — FLUTICASONE-SALMETEROL 250-50 MCG/DOSE IN AEPB: 1.0000 | INHALATION_SPRAY | Freq: Two times a day (BID) | RESPIRATORY_TRACT | Status: DC

## 2011-01-03 ENCOUNTER — Other Ambulatory Visit: Payer: Self-pay | Admitting: *Deleted

## 2011-01-03 MED ORDER — SPIRONOLACTONE 25 MG PO TABS: 25.0000 mg | ORAL_TABLET | Freq: Every day | ORAL | Status: DC

## 2011-01-03 MED ORDER — FLUTICASONE-SALMETEROL 250-50 MCG/DOSE IN AEPB: 1.0000 | INHALATION_SPRAY | Freq: Two times a day (BID) | RESPIRATORY_TRACT | Status: DC

## 2011-01-03 MED ORDER — SIMVASTATIN 20 MG PO TABS: 20.0000 mg | ORAL_TABLET | Freq: Every day | ORAL | Status: DC

## 2011-04-22 ENCOUNTER — Other Ambulatory Visit: Payer: Self-pay | Admitting: *Deleted

## 2011-04-22 MED ORDER — FUROSEMIDE 20 MG PO TABS: 20.0000 mg | ORAL_TABLET | Freq: Every day | ORAL | Status: DC

## 2011-04-24 ENCOUNTER — Ambulatory Visit (INDEPENDENT_AMBULATORY_CARE_PROVIDER_SITE_OTHER): Payer: Medicare Other | Admitting: Family Medicine

## 2011-04-24 ENCOUNTER — Encounter: Payer: Self-pay | Admitting: Family Medicine

## 2011-04-24 VITALS — BP 148/87 | HR 91 | Ht 66.75 in | Wt 255.0 lb

## 2011-04-24 DIAGNOSIS — E785 Hyperlipidemia, unspecified: Secondary | ICD-10-CM

## 2011-04-24 DIAGNOSIS — Z1231 Encounter for screening mammogram for malignant neoplasm of breast: Secondary | ICD-10-CM

## 2011-04-24 DIAGNOSIS — I509 Heart failure, unspecified: Secondary | ICD-10-CM

## 2011-04-24 DIAGNOSIS — I10 Essential (primary) hypertension: Secondary | ICD-10-CM

## 2011-04-24 MED ORDER — SIMVASTATIN 20 MG PO TABS: 20.0000 mg | ORAL_TABLET | Freq: Every day | ORAL | Status: DC

## 2011-04-24 MED ORDER — FUROSEMIDE 40 MG PO TABS: 40.0000 mg | ORAL_TABLET | Freq: Every day | ORAL | Status: DC

## 2011-04-24 MED ORDER — CARVEDILOL 12.5 MG PO TABS: 12.5000 mg | ORAL_TABLET | Freq: Two times a day (BID) | ORAL | Status: DC

## 2011-04-24 MED ORDER — LISINOPRIL 20 MG PO TABS: 20.0000 mg | ORAL_TABLET | Freq: Every day | ORAL | Status: DC

## 2011-04-24 NOTE — Progress Notes (Addendum)
  Subjective:    Patient ID: Briana Silva, female    DOB: 23-Jan-1945, 66 y.o.   MRN: [id number]  HPI HTN - she is here to followup on blood pressure today. She has not seen her cardiologist in almost a year. She is on a stat refills which is why she came in. She denies any chest pain or shortness of breath. She says she has been taking some extra Lasix here in the year and has been running out early. She is to take 40 mg but this was running to the bathroom too much to have asked her to PT decreased to 20 mg. She now would like it to be re\re increased back to 40 mg. She did stop her strong lights on. She read the side effect profile and had some major concerns. She was not having any actual side effects on the medication better.  CHF - please see above and her hypertension.   Review of Systems     Objective:   Physical Exam  Constitutional: She is oriented to person, place, and time. She appears well-developed and well-nourished.  HENT:  Head: Normocephalic and atraumatic.  Cardiovascular: Normal rate, regular rhythm and normal heart sounds.   Pulmonary/Chest: Effort normal and breath sounds normal.  Neurological: She is alert and oriented to person, place, and time.  Skin: Skin is warm and dry.  Psychiatric: She has a normal mood and affect. Her behavior is normal.          Assessment & Plan:  HTN - Uncontrolled. We discussed that we have increased her Lasix back to 40 mg. Will add lisinopril to get her blood pressure under much better control. We discussed the risks and benefits and potential side effects of the lisinopril. I did encourage her to follow back up with her cardiologist to discuss her concerns about the spironolactone and to see if they feel that it is warranted or not. I think initially she thought it was for cholesterol but I explained her that her simvastatin is for her cholesterol.  Due for BMP today. Followup in 6 weeks.  Hyperlipidemia-she is due to recheck lipid  levels. Well controlled in October.   CHF - See HTN.  I did ask her to discuss her spironolactone with her cardiologist. She says she will try make an appointment sometime this spring. For now we will work on controlling her blood pressure.  I met her she is also overdue for her screening mammogram. We will make a referral.

## 2011-04-29 ENCOUNTER — Inpatient Hospital Stay: Admission: RE | Admit: 2011-04-29 | Payer: Medicare Other | Source: Ambulatory Visit

## 2011-05-06 ENCOUNTER — Ambulatory Visit
Admission: RE | Admit: 2011-05-06 | Discharge: 2011-05-06 | Disposition: A | Discharge status: Home / Self Care | Payer: Medicare Other | Source: Ambulatory Visit | Attending: Family Medicine | Admitting: Family Medicine

## 2011-05-06 DIAGNOSIS — Z1231 Encounter for screening mammogram for malignant neoplasm of breast: Secondary | ICD-10-CM

## 2011-06-03 LAB — LIPID PANEL
Cholesterol: 159 mg/dL (ref 0–200)
HDL: 76 mg/dL — AB (ref 35–70)
LDl/HDL Ratio: 74
Triglycerides: 45 mg/dL (ref 40–160)

## 2011-06-03 LAB — BASIC METABOLIC PANEL: Glucose: 103 mg/dL

## 2011-06-23 ENCOUNTER — Encounter: Payer: Self-pay | Admitting: *Deleted

## 2011-06-23 DIAGNOSIS — M8580 Other specified disorders of bone density and structure, unspecified site: Secondary | ICD-10-CM | POA: Insufficient documentation

## 2011-09-19 ENCOUNTER — Other Ambulatory Visit: Payer: Self-pay | Admitting: *Deleted

## 2011-09-19 MED ORDER — CARVEDILOL 12.5 MG PO TABS: 12.5000 mg | ORAL_TABLET | Freq: Two times a day (BID) | ORAL | Status: DC

## 2011-09-19 MED ORDER — FUROSEMIDE 40 MG PO TABS: 40.0000 mg | ORAL_TABLET | Freq: Every day | ORAL | Status: DC

## 2011-09-19 MED ORDER — ALBUTEROL SULFATE HFA 108 (90 BASE) MCG/ACT IN AERS: 2.0000 | INHALATION_SPRAY | Freq: Four times a day (QID) | RESPIRATORY_TRACT | Status: DC | PRN

## 2011-09-19 MED ORDER — SIMVASTATIN 20 MG PO TABS: 20.0000 mg | ORAL_TABLET | Freq: Every day | ORAL | Status: DC

## 2011-09-19 MED ORDER — FLUTICASONE-SALMETEROL 250-50 MCG/DOSE IN AEPB: 1.0000 | INHALATION_SPRAY | Freq: Two times a day (BID) | RESPIRATORY_TRACT | Status: DC

## 2011-09-19 MED ORDER — LISINOPRIL 20 MG PO TABS: 20.0000 mg | ORAL_TABLET | Freq: Every day | ORAL | Status: DC

## 2011-09-25 ENCOUNTER — Ambulatory Visit: Payer: Medicare Other | Admitting: Family Medicine

## 2011-09-26 ENCOUNTER — Telehealth: Payer: Self-pay | Admitting: Family Medicine

## 2011-09-26 ENCOUNTER — Encounter: Payer: Self-pay | Admitting: Family Medicine

## 2011-09-26 ENCOUNTER — Ambulatory Visit (INDEPENDENT_AMBULATORY_CARE_PROVIDER_SITE_OTHER): Payer: Medicare Other | Admitting: Family Medicine

## 2011-09-26 VITALS — BP 123/77 | HR 85 | Ht 66.75 in | Wt 259.0 lb

## 2011-09-26 DIAGNOSIS — G4730 Sleep apnea, unspecified: Secondary | ICD-10-CM

## 2011-09-26 DIAGNOSIS — Z23 Encounter for immunization: Secondary | ICD-10-CM

## 2011-09-26 DIAGNOSIS — J45901 Unspecified asthma with (acute) exacerbation: Secondary | ICD-10-CM

## 2011-09-26 DIAGNOSIS — I10 Essential (primary) hypertension: Secondary | ICD-10-CM

## 2011-09-26 DIAGNOSIS — Z293 Encounter for prophylactic fluoride administration: Secondary | ICD-10-CM

## 2011-09-26 LAB — COMPLETE METABOLIC PANEL WITH GFR
AST: 17 U/L (ref 0–37)
Albumin: 4 g/dL (ref 3.5–5.2)
Alkaline Phosphatase: 81 U/L (ref 39–117)
BUN: 18 mg/dL (ref 6–23)
Calcium: 9.4 mg/dL (ref 8.4–10.5)
Chloride: 102 mEq/L (ref 96–112)
GFR, Est Non African American: 68 mL/min
Glucose, Bld: 95 mg/dL (ref 70–99)
Potassium: 4.2 mEq/L (ref 3.5–5.3)
Sodium: 141 mEq/L (ref 135–145)
Total Protein: 7.6 g/dL (ref 6.0–8.3)

## 2011-09-26 LAB — LIPID PANEL
Cholesterol: 163 mg/dL (ref 0–200)
Total CHOL/HDL Ratio: 2.4 Ratio
VLDL: 9 mg/dL (ref 0–40)

## 2011-09-26 MED ORDER — CARVEDILOL 12.5 MG PO TABS: 12.5000 mg | ORAL_TABLET | Freq: Two times a day (BID) | ORAL | Status: DC

## 2011-09-26 MED ORDER — ALBUTEROL SULFATE HFA 108 (90 BASE) MCG/ACT IN AERS: 2.0000 | INHALATION_SPRAY | Freq: Four times a day (QID) | RESPIRATORY_TRACT | Status: DC | PRN

## 2011-09-26 MED ORDER — CICLESONIDE 37 MCG/ACT NA AERS: 2.0000 | INHALATION_SPRAY | Freq: Every day | NASAL | Status: DC

## 2011-09-26 MED ORDER — PREDNISONE 20 MG PO TABS: 40.0000 mg | ORAL_TABLET | Freq: Every day | ORAL | Status: AC

## 2011-09-26 MED ORDER — FLUTICASONE-SALMETEROL 250-50 MCG/DOSE IN AEPB: 1.0000 | INHALATION_SPRAY | Freq: Two times a day (BID) | RESPIRATORY_TRACT | Status: DC

## 2011-09-26 MED ORDER — ALBUTEROL SULFATE (2.5 MG/3ML) 0.083% IN NEBU
2.5000 mg | INHALATION_SOLUTION | Freq: Once | RESPIRATORY_TRACT | Status: AC
  Administered 2011-09-26: 2.5 mg via RESPIRATORY_TRACT

## 2011-09-26 MED ORDER — FUROSEMIDE 40 MG PO TABS: 40.0000 mg | ORAL_TABLET | Freq: Every day | ORAL | Status: DC

## 2011-09-26 MED ORDER — SIMVASTATIN 20 MG PO TABS: 20.0000 mg | ORAL_TABLET | Freq: Every day | ORAL | Status: DC

## 2011-09-26 MED ORDER — LISINOPRIL 20 MG PO TABS: 20.0000 mg | ORAL_TABLET | Freq: Every day | ORAL | Status: DC

## 2011-09-26 NOTE — Telephone Encounter (Signed)
Call patient: After looking at her sleep study I think she actually needs to have the CPAP titration done. I noticed that the recommendations were weight reduction as well as CPAP or possible oral appliance. If she's okay we will go ahead and schedule a CPAP titration at the sleep lab for [facility] neurologic. Please let me know.

## 2011-09-26 NOTE — Patient Instructions (Addendum)

## 2011-09-26 NOTE — Telephone Encounter (Addendum)
Pt called and states her rx that was sent in today is too expensive; called pt on Friday and she didn't say meds were too expensive she just wanted to know how much the cpap equipment would cost. Advised her that that is something she would have to check into with her insurance.Pt voiced understanding

## 2011-09-26 NOTE — Progress Notes (Signed)
  Subjective:    Patient ID: Briana Silva, female    DOB: 1945-10-09, 66 y.o.   MRN: [id number]  HPI This week her asthma has  Been flaring. She has had to use her proair 2-3 times per day over the last 3 days.  Says thinks it is seasonal. No URI or fever. She says she has lost her sense of smell. She does have some mild nasal congestion.  HTN - No CP or SOB.  She takes her lisinopril, Coreg and Lasix daily. No problems or side effects with the medications.  Sleep apnea-she would like [place] to review her CPAP supplies. Initially she felt that she didn't need them so never really followed up. But now she is ready to get them.  Review of Systems     Objective:   Physical Exam  Constitutional: She is oriented to person, place, and time. She appears well-developed and well-nourished.  HENT:  Head: Normocephalic and atraumatic.       Turbinates are very swollen.   Neck: Neck supple. No thyromegaly present.  Cardiovascular: Normal rate, regular rhythm and normal heart sounds.   Pulmonary/Chest: Effort normal. She has wheezes.       Diffuse wheezing on the right. Slight wheeze on the left upper lobe.  Lymphadenopathy:    She has no cervical adenopathy.  Neurological: She is alert and oriented to person, place, and time.  Skin: Skin is warm and dry.  Psychiatric: She has a normal mood and affect. Her behavior is normal.          Assessment & Plan:  OSA - Never got her CPAP supplies.  We will place a new order to home health for supplies. She also needs a new nebulizer machine. When she has it for 66 years old is very bulky and heavy and heart trouble with.  Asthma - not well controlled currently. She's currently having exacerbation. Her peak flow today is in the yellow zone. We gave her a neb treatment and she cannot about 40 points with her peak flow but still in the yellow. I offered a second treatment she says she will do at home she does have a nebulizer home. I encouraged her restart  her Zyrtec and I gave her a sample of Zetonna to take for her swollen nasal turbinates.Given short coaurse of steroids. She is worried about wweight gain. Dsicussed that it will increase her appetite, encouraged her to just mostly snack on fruit and vegetables. If she gets worse please call the office.  HTN - Well controlled. Continue current regimen. Followup in 6 months. Continue to work on diet and exercise. She's actually involved in a research study at [facility] and has been doing some exercise through them. Also gave her information on the DASH diet. Due for CMP and fasting lipid panel.  Flu vaccine given today.

## 2011-09-29 ENCOUNTER — Encounter: Payer: Self-pay | Admitting: *Deleted

## 2011-09-29 NOTE — Progress Notes (Signed)
Quick Note:  All labs are normal. ______ 

## 2011-10-15 ENCOUNTER — Encounter: Payer: Self-pay | Admitting: *Deleted

## 2011-10-15 ENCOUNTER — Emergency Department
Admission: EM | Admit: 2011-10-15 | Discharge: 2011-10-15 | Disposition: A | Discharge status: Home / Self Care | Payer: Medicare Other | Source: Home / Self Care | Attending: Family Medicine | Admitting: Family Medicine

## 2011-10-15 DIAGNOSIS — B029 Zoster without complications: Secondary | ICD-10-CM

## 2011-10-15 MED ORDER — VALACYCLOVIR HCL 1 G PO TABS: 1000.0000 mg | ORAL_TABLET | Freq: Three times a day (TID) | ORAL | Status: DC

## 2011-10-15 NOTE — ED Provider Notes (Signed)
History     CSN: [id number]  Arrival date & time 10/15/11  1005   First MD Initiated Contact with Patient 10/15/11 1050      Chief Complaint  Patient presents with  . Rash      HPI Comments: Patient complains of becoming fatigued about five days ago with myalgias.  About 3 days ago she developed a painful, burning rash on her right forehead.  She has now developed some irritation in her right eye.  She notes some burning sensation on her nose although she has not had a rash there.  Patient is a 66 y.o. female presenting with rash. The history is provided by the patient.  Rash  This is a new problem. Episode onset: 3 days ago. The problem has been gradually worsening. The problem is associated with nothing. There has been no fever. The rash is present on the face. The pain is mild. The pain has been constant since onset. Associated symptoms include blisters, itching and pain. Pertinent negatives include no weeping. She has tried antibiotic cream for the symptoms. The treatment provided no relief.    Past Medical History  Diagnosis Date  . Allergy   . Asthma   . CHF (congestive heart failure)   . Depression   . Obesity     Past Surgical History  Procedure Date  . Tubal ligation 1967  . Left oophorectomy 1967    Family History  Problem Relation Age of Onset  . Pancreatic cancer Father   . Hypertension Mother   . Hypertension Father   . Diabetes Brother   . Cancer Daughter     Brain tumor    History  Substance Use Topics  . Smoking status: Never Smoker   . Smokeless tobacco: Not on file  . Alcohol Use: No    OB History    Grav Para Term Preterm Abortions TAB SAB Ect Mult Living                  Review of Systems  Skin: Positive for itching and rash.  All other systems reviewed and are negative.    Allergies  Review of patient's allergies indicates no known allergies.  Home Medications   Current Outpatient Rx  Name Route Sig Dispense Refill  .  ALBUTEROL SULFATE HFA 108 (90 BASE) MCG/ACT IN AERS Inhalation Inhale 2 puffs into the lungs every 6 (six) hours as needed. 6.7 g 0  . CARVEDILOL 12.5 MG PO TABS Oral Take 1 tablet (12.5 mg total) by mouth 2 (two) times daily with a meal. 60 tablet 11  . CETIRIZINE HCL 10 MG PO TABS Oral Take 10 mg by mouth daily.      Marland Kitchen CICLESONIDE 37 MCG/ACT NA AERS Nasal Place 2 Squirts into the nose daily. 3 g 0  . FLAX SEED OIL 1000 MG PO CAPS Oral Take 1 capsule by mouth daily.    Marland Kitchen FLUTICASONE-SALMETEROL 250-50 MCG/DOSE IN AEPB Inhalation Inhale 1 puff into the lungs every 12 (twelve) hours. 60 each 5  . FUROSEMIDE 40 MG PO TABS Oral Take 1 tablet (40 mg total) by mouth daily. 90 tablet 3  . LISINOPRIL 20 MG PO TABS Oral Take 1 tablet (20 mg total) by mouth daily. 90 tablet 3  . FISH OIL 1000 MG PO CAPS Oral Take 1 capsule by mouth daily.    Marland Kitchen SIMVASTATIN 20 MG PO TABS Oral Take 1 tablet (20 mg total) by mouth at bedtime. 30 tablet 11  .  VALACYCLOVIR HCL 1 G PO TABS Oral Take 1 tablet (1,000 mg total) by mouth 3 (three) times daily. 21 tablet 0    BP 135/85  Pulse 81  Temp 98.7 F (37.1 C) (Oral)  Resp 14  Ht 5\' 7"  (1.702 m)  Wt 263 lb (119.296 kg)  BMI 41.19 kg/m2  SpO2 98%  Physical Exam  Nursing note and vitals reviewed. Constitutional: She is oriented to person, place, and time. She appears well-developed and well-nourished.       Patient is obese (BMI 41.2)   HENT:  Head: Normocephalic and atraumatic.    Nose: Nose normal.  Mouth/Throat: Oropharynx is clear and moist.       There is a herpetiform eruption right temple and forehead as noted on diagram  Eyes: EOM are normal. Pupils are equal, round, and reactive to light. Right conjunctiva is injected.       Right upper eyelid is slightly swollen and erythematous but non-tender. No photophobia  Neck: Neck supple.  Cardiovascular: Normal heart sounds.   Pulmonary/Chest: Breath sounds normal.  Abdominal: Soft. There is no tenderness.    Lymphadenopathy:    She has no cervical adenopathy.  Neurological: She is alert and oriented to person, place, and time.  Skin: Skin is warm and dry. Rash noted.    ED Course  Procedures  none      1. Shingles, with early right ocular involvement.       MDM  Begin Valtrex Followup with ophthalmologist as soon as possible.         Lattie Haw, MD 10/15/11 [phone]

## 2011-10-15 NOTE — ED Notes (Addendum)
Patient c/o rash to right side of face and right eye swelling. Also c/o right ear pain and congestion. Used antibiotic ointment on rash. Patient does have a hx of chicken pox.

## 2011-10-17 ENCOUNTER — Telehealth: Payer: Self-pay

## 2011-10-17 NOTE — ED Notes (Signed)
Rochell is doing about the same. She did see the ophthalmologist.

## 2011-10-27 ENCOUNTER — Ambulatory Visit (INDEPENDENT_AMBULATORY_CARE_PROVIDER_SITE_OTHER): Payer: Medicare Other | Admitting: Family Medicine

## 2011-10-27 ENCOUNTER — Encounter: Payer: Self-pay | Admitting: Family Medicine

## 2011-10-27 VITALS — BP 125/75 | HR 86 | Ht 67.0 in | Wt 262.0 lb

## 2011-10-27 DIAGNOSIS — G4733 Obstructive sleep apnea (adult) (pediatric): Secondary | ICD-10-CM

## 2011-10-27 DIAGNOSIS — J45909 Unspecified asthma, uncomplicated: Secondary | ICD-10-CM

## 2011-10-27 DIAGNOSIS — B029 Zoster without complications: Secondary | ICD-10-CM

## 2011-10-27 MED ORDER — AMBULATORY NON FORMULARY MEDICATION: Status: DC

## 2011-10-27 MED ORDER — ALBUTEROL SULFATE (2.5 MG/3ML) 0.083% IN NEBU: 2.5000 mg | INHALATION_SOLUTION | Freq: Four times a day (QID) | RESPIRATORY_TRACT | Status: DC | PRN

## 2011-10-27 NOTE — Progress Notes (Signed)
  Subjective:    Patient ID: Briana Silva, female    DOB: November 14, 1945, 66 y.o.   MRN: [id number]  HPI Shingles- We had to give her oral steroids. Right after that she had a breakout of shingles on her forehead scalp and towards her right eye.  She did have her eye examined by the ophthalmologist to make sure that there was no eye involvement and there was not.  She still has some tingling and some tenderness but no significant pain. The lesions are crusted over and dry. She still has a little bit of hyperpigmentation where the actual vesicles were.  OSA - She got her supplies and is using them adn says she is feeling some better.   Asthma - Asthma - she's actually doing fairly well after her recent exacerbation. We had to give her oral steroids. Right after that she had a breakout of shingles on her forehead scalp and towards her right eye.  She did have her eye examined by the ophthalmologist to make sure that there was no eye involvement and there was not. has been on advair for so long.     Review of Systems     Objective:   Physical Exam  Constitutional: She is oriented to person, place, and time. She appears well-developed and well-nourished.  HENT:  Head: Normocephalic and atraumatic.  Cardiovascular: Normal rate, regular rhythm and normal heart sounds.   Pulmonary/Chest: Effort normal and breath sounds normal.  Neurological: She is alert and oriented to person, place, and time.  Skin: Skin is warm and dry.       Hyperpigmentation over her middle 4 head and towards the lateral portion of her right eye. No active lesions or crusting. It appears to have been healing well.  Psychiatric: She has a normal mood and affect. Her behavior is normal.          Assessment & Plan:  Asthma - she's actually doing fairly well after her recent exacerbation. I think at this point we can try stopping her down to the Advair 100/50 and hopefully she will do well. We discussed the goal is to up and  step down therapy is for his asthma is concerned. Followup in a couple months. That point she still doing well we can switch her to just an inhaled corticosteroid. She did need a prescription for vials for her nebulizer machine she was finally able to get through home health.  Sleep apnea-she's doing well back on her CPAP. She is actually for a little better. If this will help with energy levels and weight loss.  Shingles - resolving. She will likely have some hyperpigmentation may be some scarring. Fortunately it sounds like she's not having any postherpetic neuralgia. I do suspect that the recent course of prednisone may have helped trigger the outbreak.  Given her prescription for shingles vaccine.

## 2011-11-25 ENCOUNTER — Telehealth: Payer: Self-pay | Admitting: *Deleted

## 2011-11-27 NOTE — Telephone Encounter (Signed)
See other message

## 2012-01-27 ENCOUNTER — Ambulatory Visit (INDEPENDENT_AMBULATORY_CARE_PROVIDER_SITE_OTHER): Payer: Medicare Other | Admitting: Family Medicine

## 2012-01-27 ENCOUNTER — Encounter: Payer: Self-pay | Admitting: Family Medicine

## 2012-01-27 VITALS — BP 126/79 | HR 85 | Resp 18 | Wt 256.0 lb

## 2012-01-27 DIAGNOSIS — G4733 Obstructive sleep apnea (adult) (pediatric): Secondary | ICD-10-CM

## 2012-01-27 DIAGNOSIS — E669 Obesity, unspecified: Secondary | ICD-10-CM

## 2012-01-27 DIAGNOSIS — J45909 Unspecified asthma, uncomplicated: Secondary | ICD-10-CM

## 2012-01-27 DIAGNOSIS — I10 Essential (primary) hypertension: Secondary | ICD-10-CM

## 2012-01-27 DIAGNOSIS — J45901 Unspecified asthma with (acute) exacerbation: Secondary | ICD-10-CM

## 2012-01-27 MED ORDER — FLUTICASONE-SALMETEROL 100-50 MCG/DOSE IN AEPB: 1.0000 | INHALATION_SPRAY | Freq: Two times a day (BID) | RESPIRATORY_TRACT | Status: DC

## 2012-01-27 MED ORDER — CICLESONIDE 37 MCG/ACT NA AERS: 2.0000 | INHALATION_SPRAY | Freq: Every day | NASAL | Status: DC

## 2012-01-27 NOTE — Progress Notes (Signed)
  Subjective:    Patient ID: Briana Silva, female    DOB: 1945-08-04, 67 y.o.   MRN: [id number]  HPI Asthma - No recent flares. Using her albuterol 1-2 times a week.  No nightime sxs. Only used her neb machine once in the last 3 months.  Has had some nasal congestion.  Ran out of the sample nasal steroid. At her last visit 3 months ago we were going to decrease her Advair dose. Unfortunately the pharmacy never received a prescription.  Sleep apnea - Hasn't been using her CPAP bc had an outbreak of shingles.  She received a letter from the company stating that he needs to be shipped back to them. This is most likely because of poor compliance. But she wants and if they can be reordered so that she can retry it. She does have a diagnosis of obstructive sleep apnea as well as congestive heart failure, hypertension and obesity.  HTN-  Pt denies chest pain, SOB, dizziness, or heart palpitations.  Taking meds as directed w/o problems.  Denies medication side effects.      Review of Systems     Objective:   Physical Exam  Constitutional: She is oriented to person, place, and time. She appears well-developed and well-nourished.  HENT:  Head: Normocephalic and atraumatic.  Cardiovascular: Normal rate, regular rhythm and normal heart sounds.   Pulmonary/Chest: Effort normal and breath sounds normal.  Neurological: She is alert and oriented to person, place, and time.  Skin: Skin is warm and dry.  Psychiatric: She has a normal mood and affect. Her behavior is normal.          Assessment & Plan:  Asthma - well controlled. Will decrease her Advair to the 100. Call if she starts noticing more frequent exacerbations. Followup in 3-4 months.  Sleep apnea - I asked her to let me know what company she uses. She can check on her CPAP machine or check a letter that she received was requesting to see him back. We can certainly try to reorder it and then try to improve her compliance to really do think  that she needs it and I do think it would help with her sleep apnea, blood pressure, obesity and her congestive heart failure.  Obesity - Says she has lost 7 lbs since October.  Keep up the goodwork.  No regular exercise but encouraged her to starts a rounting.  She does have a bike at home and encouraged her start with maybe just 5 minutes a day.Marland Kitchen  HTN - She has been focusing on low salt diet.  Well controlled.  F/U in 3-4 months.

## 2012-04-26 ENCOUNTER — Ambulatory Visit: Payer: Medicare Other | Admitting: Family Medicine

## 2012-04-28 ENCOUNTER — Ambulatory Visit (INDEPENDENT_AMBULATORY_CARE_PROVIDER_SITE_OTHER): Payer: Medicare Other | Admitting: Family Medicine

## 2012-04-28 ENCOUNTER — Encounter: Payer: Self-pay | Admitting: Family Medicine

## 2012-04-28 VITALS — BP 127/72 | HR 85 | Wt 258.0 lb

## 2012-04-28 DIAGNOSIS — I10 Essential (primary) hypertension: Secondary | ICD-10-CM

## 2012-04-28 DIAGNOSIS — J45909 Unspecified asthma, uncomplicated: Secondary | ICD-10-CM

## 2012-04-28 DIAGNOSIS — J4530 Mild persistent asthma, uncomplicated: Secondary | ICD-10-CM

## 2012-04-28 DIAGNOSIS — I509 Heart failure, unspecified: Secondary | ICD-10-CM

## 2012-04-28 MED ORDER — CICLESONIDE 37 MCG/ACT NA AERS: 2.0000 | INHALATION_SPRAY | Freq: Every day | NASAL | Status: DC

## 2012-04-28 NOTE — Progress Notes (Signed)
  Subjective:    Patient ID: Briana Silva, female    DOB: 1945/09/19, 67 y.o.   MRN: [id number]  HPI A week ago had to use her inhaler after a screaming argument.  She got SOB. No CP., Has some wheezing that night and has to use her inhaler. ON the Advair 250.  On zyrtec and ran out of  zetonna. She has actually been using the Advair 250. She never picked up the prescription for the 100. She said it was never sent to the pharmacy. Per our records it was sent to [facility].   Hypertension-she also admits that she has not been taking her blood pressure.. She felt like she was doing good with her weight loss in her diet so decided to stop it. No chest pain. No recent swelling or edema. She does take her Lasix daily.  Review of Systems     Objective:   Physical Exam  Constitutional: She is oriented to person, place, and time. She appears well-developed and well-nourished.  HENT:  Head: Normocephalic and atraumatic.  Cardiovascular: Normal rate, regular rhythm and normal heart sounds.   Pulmonary/Chest: Effort normal and breath sounds normal.  Neurological: She is alert and oriented to person, place, and time.  Skin: Skin is warm and dry.  Psychiatric: She has a normal mood and affect. Her behavior is normal.          Assessment & Plan:  Asthma-mild, persistent-we'll continue with Advair 250/50 is working fairly well for her. Encouraged her to get back on her nasal steroid spray in addition to her over-the-counter antihistamine for the spring as this does tend to be a difficult time of year with her allergies. Followup in 3 months. If she's doing well at that time we might be able to decrease her regimen down to just an inhaled corticosteroid.  Hypertension-she's off of lisinopril and doing well. Her blood pressure looks fantastic today. In fact I went ahead and removed hypertension from her problem list.   CHF - She does have a history of congestive heart failure. It's not clear to me  exactly what caused this and if she still has congestive heart failure. She still is using her Lasix daily. It looks like she was post followup cardiology back in 2012 that do not see note scanned into the system. May need to get a little deeper to investigate for this diagnosis came from and if it is still valid. We might even need to consider repeating an echocardiogram.

## 2012-07-28 ENCOUNTER — Encounter: Payer: Self-pay | Admitting: Family Medicine

## 2012-07-28 ENCOUNTER — Ambulatory Visit (INDEPENDENT_AMBULATORY_CARE_PROVIDER_SITE_OTHER): Payer: Medicare Other | Admitting: Family Medicine

## 2012-07-28 VITALS — BP 132/82 | HR 91 | Wt 258.0 lb

## 2012-07-28 DIAGNOSIS — J309 Allergic rhinitis, unspecified: Secondary | ICD-10-CM

## 2012-07-28 DIAGNOSIS — J4540 Moderate persistent asthma, uncomplicated: Secondary | ICD-10-CM

## 2012-07-28 DIAGNOSIS — J45909 Unspecified asthma, uncomplicated: Secondary | ICD-10-CM

## 2012-07-28 DIAGNOSIS — Z1211 Encounter for screening for malignant neoplasm of colon: Secondary | ICD-10-CM

## 2012-07-28 DIAGNOSIS — G4733 Obstructive sleep apnea (adult) (pediatric): Secondary | ICD-10-CM

## 2012-07-28 MED ORDER — CICLESONIDE 37 MCG/ACT NA AERS: 2.0000 | INHALATION_SPRAY | Freq: Every day | NASAL | Status: DC

## 2012-07-28 MED ORDER — FLUTICASONE-SALMETEROL 250-50 MCG/DOSE IN AEPB: 1.0000 | INHALATION_SPRAY | Freq: Two times a day (BID) | RESPIRATORY_TRACT | Status: DC

## 2012-07-28 NOTE — Progress Notes (Signed)
  Subjective:    Patient ID: Briana Silva, female    DOB: 1945/06/28, 67 y.o.   MRN: [id number]  HPI  Followup asthma-well as are 3 months ago she was struggling with her allergies and was triggering her asthma. She was using her Advair 250/50 regularly. I had  encouraged her restart her nasal  steroid spray. She never filled it. C/O sme mild nasal congestion.  She really feels she can't decrease her Advair.  Stil has carpet in her home. Was doing well until about a week ago. Has used her albuterol a few times.  Last time used her proair last night. Recent cold or fevers.  Review of Systems     Objective:   Physical Exam  Constitutional: She is oriented to person, place, and time. She appears well-developed and well-nourished.  HENT:  Head: Normocephalic and atraumatic.  Right Ear: External ear normal.  Left Ear: External ear normal.  Nose: Nose normal.  Mouth/Throat: Oropharynx is clear and moist.  TMs and canals are clear.   Eyes: Conjunctivae and EOM are normal. Pupils are equal, round, and reactive to light.  Neck: Neck supple. No thyromegaly present.  Cardiovascular: Normal rate, regular rhythm and normal heart sounds.   Pulmonary/Chest: Effort normal. She has wheezes.  Expiratory wheezing, diffuse  Lymphadenopathy:    She has no cervical adenopathy.  Neurological: She is alert and oriented to person, place, and time.  Skin: Skin is warm and dry.  Psychiatric: She has a normal mood and affect.          Assessment & Plan:  AR- Will restart nasal steroid.  New rx sent.  I think will help greatly with nasal congestion. If not improving consider acute sinusitis.    Asthma-moderate persistent, I was hoping to be would decrease her Advair dose today but it sounds like we are not. Continue current Advair regimen. She was wheezing in the office here today. She last used her albuterol last night. Peak flow in the green zone which is reassuring. Encouraged her to be liberal with  her albuterol inhaler in the next few days 3-4 hours as needed. If she's not improving over the next 2 days then please call and I'll put her on a course of steroids.  She is overdue for colonoscopy. The last time she had one she had to be resuscitated. Since then she's been diagnosed with sleep apnea and does wear CPAP. She would prefer to have it done in the hospital setting and prefers to have it done here and [place]. She believes that it was previously done as GI but would like to have it done at [facility]. Referral placed.  -Encouraged her to be consistent about her CPAP. She reports she uses it sometimes but not every night.

## 2012-10-28 ENCOUNTER — Ambulatory Visit: Payer: Medicare Other | Admitting: Family Medicine

## 2012-10-28 DIAGNOSIS — Z0289 Encounter for other administrative examinations: Secondary | ICD-10-CM

## 2013-01-04 ENCOUNTER — Other Ambulatory Visit: Payer: Self-pay

## 2013-01-04 MED ORDER — ALBUTEROL SULFATE HFA 108 (90 BASE) MCG/ACT IN AERS: 2.0000 | INHALATION_SPRAY | Freq: Four times a day (QID) | RESPIRATORY_TRACT | Status: DC | PRN

## 2013-01-04 MED ORDER — FUROSEMIDE 40 MG PO TABS: 40.0000 mg | ORAL_TABLET | Freq: Every day | ORAL | Status: DC

## 2013-01-04 MED ORDER — FLUTICASONE-SALMETEROL 250-50 MCG/DOSE IN AEPB: 1.0000 | INHALATION_SPRAY | Freq: Two times a day (BID) | RESPIRATORY_TRACT | Status: DC

## 2013-01-10 ENCOUNTER — Encounter: Payer: Self-pay | Admitting: Family Medicine

## 2013-01-10 ENCOUNTER — Ambulatory Visit (INDEPENDENT_AMBULATORY_CARE_PROVIDER_SITE_OTHER): Payer: Medicare Other | Admitting: Family Medicine

## 2013-01-10 VITALS — BP 125/78 | HR 107 | Temp 97.8°F | Ht 67.0 in | Wt 249.0 lb

## 2013-01-10 DIAGNOSIS — Z23 Encounter for immunization: Secondary | ICD-10-CM

## 2013-01-10 DIAGNOSIS — J45901 Unspecified asthma with (acute) exacerbation: Secondary | ICD-10-CM

## 2013-01-10 MED ORDER — PREDNISONE 50 MG PO TABS: 50.0000 mg | ORAL_TABLET | Freq: Every day | ORAL | Status: DC

## 2013-01-10 MED ORDER — ALBUTEROL SULFATE (2.5 MG/3ML) 0.083% IN NEBU: 2.5000 mg | INHALATION_SOLUTION | RESPIRATORY_TRACT | Status: DC | PRN

## 2013-01-10 MED ORDER — ALBUTEROL SULFATE HFA 108 (90 BASE) MCG/ACT IN AERS: 2.0000 | INHALATION_SPRAY | RESPIRATORY_TRACT | Status: DC | PRN

## 2013-01-10 MED ORDER — FLUTICASONE-SALMETEROL 250-50 MCG/DOSE IN AEPB: 1.0000 | INHALATION_SPRAY | Freq: Two times a day (BID) | RESPIRATORY_TRACT | Status: DC

## 2013-01-10 MED ORDER — ALBUTEROL SULFATE (2.5 MG/3ML) 0.083% IN NEBU
2.5000 mg | INHALATION_SOLUTION | Freq: Once | RESPIRATORY_TRACT | Status: AC
  Administered 2013-01-10: 2.5 mg via RESPIRATORY_TRACT

## 2013-01-10 NOTE — Progress Notes (Signed)
Subjective:    Patient ID: Briana Silva, female    DOB: 1945-05-02, 67 y.o.   MRN: [id number]  HPI 3 days of SOB with her asthma. Not sure what triggered it. She is now living with her daughter as having to go up and down the stairs more often. No URI sxs.  Some wheezing.  Not waking her up.  Using Proair 2-3 times a day.  No swelling or fluid retention. No cough.  Voice is hoarse today.  Has neb machine. Ran out of Advair for about a week.   Review of Systems  BP 125/78  Pulse 107  Temp(Src) 97.8 F (36.6 C)  Ht 5\' 7"  (1.702 m)  Wt 249 lb (112.946 kg)  BMI 38.99 kg/m2  SpO2 91%  PF 320 L/min    No Known Allergies  Past Medical History  Diagnosis Date  . Allergy   . Asthma   . CHF (congestive heart failure)   . Depression   . Obesity     Past Surgical History  Procedure Laterality Date  . Tubal ligation  1967  . Left oophorectomy  1967    History   Social History  . Marital Status: Widowed    Spouse Name: N/A    Number of Children: 2  . Years of Education: N/A   Occupational History  . Retired.     Social History Main Topics  . Smoking status: Never Smoker   . Smokeless tobacco: Not on file  . Alcohol Use: No  . Drug Use: No  . Sexual Activity:    Other Topics Concern  . Not on file   Social History Narrative   No regular exercise.  Going to a nutrition center.     Family History  Problem Relation Age of Onset  . Pancreatic cancer Father   . Hypertension Mother   . Hypertension Father   . Diabetes Brother   . Cancer Daughter     Brain tumor    Outpatient Encounter Prescriptions as of 01/10/2013  Medication Sig  . albuterol (PROAIR HFA) 108 (90 BASE) MCG/ACT inhaler Inhale 2 puffs into the lungs every 4 (four) hours as needed for wheezing or shortness of breath.  Marland Kitchen albuterol (PROVENTIL) (2.5 MG/3ML) 0.083% nebulizer solution Take 3 mLs (2.5 mg total) by nebulization every 4 (four) hours as needed for wheezing or shortness of breath.  .  cetirizine (ZYRTEC) 10 MG tablet Take 10 mg by mouth daily as needed for allergies or rhinitis.  . Fluticasone-Salmeterol (ADVAIR) 250-50 MCG/DOSE AEPB Inhale 1 puff into the lungs every 12 (twelve) hours.  . furosemide (LASIX) 40 MG tablet Take 1 tablet (40 mg total) by mouth daily.  . Omega-3 Fatty Acids (FISH OIL) 1000 MG CAPS Take 1 capsule by mouth daily.  . [DISCONTINUED] albuterol (PROAIR HFA) 108 (90 BASE) MCG/ACT inhaler Inhale 2 puffs into the lungs every 6 (six) hours as needed.  . [DISCONTINUED] albuterol (PROVENTIL) (2.5 MG/3ML) 0.083% nebulizer solution Take 3 mLs (2.5 mg total) by nebulization every 6 (six) hours as needed for wheezing.  . [DISCONTINUED] Ciclesonide (ZETONNA) 37 MCG/ACT AERS Place 2 Squirts into the nose daily.  . [DISCONTINUED] Fluticasone-Salmeterol (ADVAIR) 250-50 MCG/DOSE AEPB Inhale 1 puff into the lungs every 12 (twelve) hours.  . predniSONE (DELTASONE) 50 MG tablet Take 1 tablet (50 mg total) by mouth daily.  Marland Kitchen albuterol (PROVENTIL) (2.5 MG/3ML) 0.083% nebulizer solution 2.5 mg           Objective:   Physical  Exam  Constitutional: She is oriented to person, place, and time. She appears well-developed and well-nourished.  HENT:  Head: Normocephalic and atraumatic.  Right Ear: External ear normal.  Left Ear: External ear normal.  Nose: Nose normal.  Mouth/Throat: Oropharynx is clear and moist.  TMs and canals are clear.   Eyes: Conjunctivae and EOM are normal. Pupils are equal, round, and reactive to light.  Neck: Neck supple. No thyromegaly present.  Cardiovascular: Normal rate, regular rhythm and normal heart sounds.   Pulmonary/Chest: Effort normal and breath sounds normal. She has no wheezes.  Musculoskeletal: She exhibits no edema.  Lymphadenopathy:    She has no cervical adenopathy.  Neurological: She is alert and oriented to person, place, and time.  Skin: Skin is warm and dry.  Psychiatric: She has a normal mood and affect. Her behavior  is normal.          Assessment & Plan:  Asthma exacerbation- Given neb tx in the office and peak flow improved form yellow to green zone. Her pulse ox came up to 95% after neb.  Be liberal with albuterol. Use every 2-4 hours prn.  Will refill meds. Restart Advair ( she ran out) . Sample given today.  Call if getting more SOB or chest getting more tight, develop fever, cough, chills or sinus sxs. Will tx with prednisone x 5 days.

## 2013-02-08 ENCOUNTER — Ambulatory Visit (INDEPENDENT_AMBULATORY_CARE_PROVIDER_SITE_OTHER): Payer: Medicare Other | Admitting: Family Medicine

## 2013-02-08 ENCOUNTER — Encounter: Payer: Self-pay | Admitting: Family Medicine

## 2013-02-08 VITALS — BP 130/77 | HR 91 | Temp 97.9°F | Wt 250.0 lb

## 2013-02-08 DIAGNOSIS — H9190 Unspecified hearing loss, unspecified ear: Secondary | ICD-10-CM

## 2013-02-08 DIAGNOSIS — H9192 Unspecified hearing loss, left ear: Secondary | ICD-10-CM

## 2013-02-08 DIAGNOSIS — J45909 Unspecified asthma, uncomplicated: Secondary | ICD-10-CM

## 2013-02-08 DIAGNOSIS — J3489 Other specified disorders of nose and nasal sinuses: Secondary | ICD-10-CM

## 2013-02-08 DIAGNOSIS — H9201 Otalgia, right ear: Secondary | ICD-10-CM

## 2013-02-08 DIAGNOSIS — H9209 Otalgia, unspecified ear: Secondary | ICD-10-CM

## 2013-02-08 MED ORDER — PREDNISONE 20 MG PO TABS: 40.0000 mg | ORAL_TABLET | Freq: Every day | ORAL | Status: DC

## 2013-02-08 MED ORDER — FLUTICASONE PROPIONATE 50 MCG/ACT NA SUSP: 2.0000 | Freq: Every day | NASAL | Status: DC

## 2013-02-08 NOTE — Progress Notes (Signed)
Subjective:    Patient ID: Briana Silva, female    DOB: Oct 13, 1945, 68 y.o.   MRN: [id number]  HPI pt states that she has been experiencing an earache, and sinus issues x 1 wk. pt wanted to discuss using nasocort since this has worked well in the past. occ clear Data processing manager.  No fever or chills.   A cold draft to makes her pain worse. A warm hairdryer on it makes it feel better. She has even taken ibuprofen at times with ear pain to get some relief and it does seem to help. 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  F/U asthma - no SOB today.  Using Advair daily. Has been using her albuterol twice a day in addition. Not necessarily because of symptoms.  Hearing loss in left ear-unknown cause or trauma or injury. No known significant amount of noise exposure over her lifetime. She said she did have hearing testing done several years ago. She said at that time they did not know why she had hearing loss. She thinks she was told it was high frequency but is not sure. Review of Systems     Objective:   Physical Exam  Constitutional: She is oriented to person, place, and time. She appears well-developed and well-nourished.  HENT:  Head: Normocephalic and atraumatic.  Right Ear: External ear normal.  Left Ear: External ear normal.  Nose: Nose normal.  Mouth/Throat: Oropharynx is clear and moist.  TMs and canals are clear.   Eyes: Conjunctivae and EOM are normal. Pupils are equal, round, and reactive to light.  Neck: Neck supple. No thyromegaly present.  Cardiovascular: Normal rate, regular rhythm and normal heart sounds.   Pulmonary/Chest: Effort normal and breath sounds normal. She has no wheezes.  Lymphadenopathy:    She has no cervical adenopathy.  Neurological: She is alert and oriented to person, place, and time.  Skin: Skin is warm and dry.  Psychiatric: She has a normal mood and affect.           Assessment & Plan:  Asthma - lung exam is great today. Continue Advair daily. Start decreasing the albuterol down from twice a day to once a day and gradually weaned to as needed. Think overall she's doing much better. Followup in 2 months for asthma.  Allergic rhinitis-will send her for new prescription for generic Flonase. It looks like it is less expensive on her formulary compared to [place]. But if she does not do well with the fluticasone and we can always in a prescription for Nasacort.  Right ear pain-exam itself is normal. We'll perform tympanometry. Tympanogram showed low peak height initially with a shift to the left. We did repeat with a different ear kit. And actually had to curves. One occurred still showed low peak height. Start 5 days of prednisone in case she does have an effusion but I do not see a visible effusion behind the ear. If she's not improving over the next week then she will call the office back and we'll make a referral to ENT for further evaluation. 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 She would like another to me anything done for her hearing loss. She might even be a candidate for hearing aid but I think she does need a full evaluation.  It sounds like she may have had audiometry years ago but I'm not sure if she actually sought 8 ENT.

## 2013-02-08 NOTE — Patient Instructions (Signed)
Call if your ear is not getting better.

## 2013-02-10 ENCOUNTER — Other Ambulatory Visit: Payer: Self-pay | Admitting: *Deleted

## 2013-02-10 MED ORDER — ALBUTEROL SULFATE HFA 108 (90 BASE) MCG/ACT IN AERS: 2.0000 | INHALATION_SPRAY | RESPIRATORY_TRACT | Status: DC | PRN

## 2013-02-10 MED ORDER — FUROSEMIDE 40 MG PO TABS: 40.0000 mg | ORAL_TABLET | Freq: Every day | ORAL | Status: DC

## 2013-02-10 MED ORDER — FLUTICASONE-SALMETEROL 100-50 MCG/DOSE IN AEPB: 1.0000 | INHALATION_SPRAY | Freq: Two times a day (BID) | RESPIRATORY_TRACT | Status: DC

## 2013-02-28 ENCOUNTER — Other Ambulatory Visit: Payer: Self-pay | Admitting: Physician Assistant

## 2013-03-03 ENCOUNTER — Encounter: Payer: Self-pay | Admitting: Family Medicine

## 2013-06-03 ENCOUNTER — Other Ambulatory Visit: Payer: Self-pay | Admitting: *Deleted

## 2013-06-03 MED ORDER — FLUTICASONE-SALMETEROL 100-50 MCG/DOSE IN AEPB: 1.0000 | INHALATION_SPRAY | Freq: Two times a day (BID) | RESPIRATORY_TRACT | Status: DC

## 2013-09-08 ENCOUNTER — Ambulatory Visit (INDEPENDENT_AMBULATORY_CARE_PROVIDER_SITE_OTHER): Payer: Medicare Other | Admitting: Family Medicine

## 2013-09-08 ENCOUNTER — Encounter: Payer: Self-pay | Admitting: Family Medicine

## 2013-09-08 ENCOUNTER — Ambulatory Visit (INDEPENDENT_AMBULATORY_CARE_PROVIDER_SITE_OTHER): Payer: Medicare Other

## 2013-09-08 VITALS — BP 145/86 | HR 70 | Wt 257.0 lb

## 2013-09-08 DIAGNOSIS — Z23 Encounter for immunization: Secondary | ICD-10-CM

## 2013-09-08 DIAGNOSIS — M79609 Pain in unspecified limb: Secondary | ICD-10-CM

## 2013-09-08 DIAGNOSIS — M79645 Pain in left finger(s): Secondary | ICD-10-CM

## 2013-09-08 DIAGNOSIS — IMO0001 Reserved for inherently not codable concepts without codable children: Secondary | ICD-10-CM

## 2013-09-08 DIAGNOSIS — Z780 Asymptomatic menopausal state: Secondary | ICD-10-CM

## 2013-09-08 DIAGNOSIS — Z1231 Encounter for screening mammogram for malignant neoplasm of breast: Secondary | ICD-10-CM

## 2013-09-08 DIAGNOSIS — M159 Polyosteoarthritis, unspecified: Secondary | ICD-10-CM

## 2013-09-08 DIAGNOSIS — M79675 Pain in left toe(s): Secondary | ICD-10-CM

## 2013-09-08 DIAGNOSIS — E785 Hyperlipidemia, unspecified: Secondary | ICD-10-CM

## 2013-09-08 DIAGNOSIS — J309 Allergic rhinitis, unspecified: Secondary | ICD-10-CM

## 2013-09-08 MED ORDER — FLUTICASONE-SALMETEROL 100-50 MCG/DOSE IN AEPB: 1.0000 | INHALATION_SPRAY | Freq: Two times a day (BID) | RESPIRATORY_TRACT | Status: DC

## 2013-09-08 MED ORDER — ALBUTEROL SULFATE (2.5 MG/3ML) 0.083% IN NEBU: 2.5000 mg | INHALATION_SOLUTION | RESPIRATORY_TRACT | Status: DC | PRN

## 2013-09-08 NOTE — Patient Instructions (Signed)
Keep up a regular exercise program and make sure you are eating a healthy diet Try to eat 4 servings of dairy a day, or if you are lactose intolerant take a calcium with vitamin D daily.  Your vaccines are up to date.   

## 2013-09-08 NOTE — Progress Notes (Signed)
Subjective:    Briana Silva is a 68 y.o. female who presents for Medicare Annual/Subsequent preventive examination.  Preventive Screening-Counseling & Management  Tobacco History  Smoking status  . Never Smoker   Smokeless tobacco  . Not on file     Problems Prior to Visit 1. having persistent nasal congestion and difficulty hearing. She says if she is able to pop her ears she can certainly hear better. She feels it's related to her sinuses. Ciardi takes antihistamine and nasal steroid but still having significant symptoms. She had some hearing testing done a couple years ago and was told everything was all right. No ear pain. No recent upper respiratory symptoms or fever.  Current Problems (verified) Patient Active Problem List   Diagnosis Date Noted  . 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 Osteopenia 06/23/2011  . OSA (obstructive sleep apnea) 08/08/2010  . EXTRINSIC ASTHMA, WITH EXACERBATION 12/04/2009  . HYPERLIPIDEMIA 07/10/2009  . DEPRESSION, MILD 07/10/2009  . OBESITY, UNSPECIFIED 12/15/2008  . OTHER SPECIFIED FORMS OF HEARING LOSS 12/15/2008  . CHF 12/15/2008  . ALLERGIC RHINITIS CAUSE UNSPECIFIED 12/15/2008  . POSTMENOPAUSAL STATUS 12/15/2008  . Unspecified asthma 10/03/2008    Medications Prior to Visit Current Outpatient Prescriptions on File Prior to Visit  Medication Sig Dispense Refill  . albuterol (PROAIR HFA) 108 (90 BASE) MCG/ACT inhaler Inhale 2 puffs into the lungs every 4 (four) hours as needed for wheezing or shortness of breath.  6.7 g  4  . albuterol (PROVENTIL) (2.5 MG/3ML) 0.083% nebulizer solution Take 3 mLs (2.5 mg total) by nebulization every 4 (four) hours as needed for wheezing or shortness of breath.  75 mL  6  . cetirizine (ZYRTEC) 10 MG tablet Take 10 mg by mouth daily as needed for allergies or rhinitis.      . fluticasone (FLONASE) 50 MCG/ACT nasal spray Place 2 sprays into both nostrils daily.  16 g  6  . Fluticasone-Salmeterol (ADVAIR  DISKUS) 100-50 MCG/DOSE AEPB Inhale 1 puff into the lungs every 12 (twelve) hours.  60 each  4  . furosemide (LASIX) 40 MG tablet Take 1 tablet (40 mg total) by mouth daily.  90 tablet  1  . Omega-3 Fatty Acids (FISH OIL) 1000 MG CAPS Take 1 capsule by mouth daily.       No current facility-administered medications on file prior to visit.    Current Medications (verified) Current Outpatient Prescriptions  Medication Sig Dispense Refill  . albuterol (PROAIR HFA) 108 (90 BASE) MCG/ACT inhaler Inhale 2 puffs into the lungs every 4 (four) hours as needed for wheezing or shortness of breath.  6.7 g  4  . albuterol (PROVENTIL) (2.5 MG/3ML) 0.083% nebulizer solution Take 3 mLs (2.5 mg total) by nebulization every 4 (four) hours as needed for wheezing or shortness of breath.  75 mL  6  . cetirizine (ZYRTEC) 10 MG tablet Take 10 mg by mouth daily as needed for allergies or rhinitis.      . fluticasone (FLONASE) 50 MCG/ACT nasal spray Place 2 sprays into both nostrils daily.  16 g  6  . Fluticasone-Salmeterol (ADVAIR DISKUS) 100-50 MCG/DOSE AEPB Inhale 1 puff into the lungs every 12 (twelve) hours.  60 each  4  . furosemide (LASIX) 40 MG tablet Take 1 tablet (40 mg total) by mouth daily.  90 tablet  1  . Omega-3 Fatty Acids (FISH OIL) 1000 MG CAPS Take 1 capsule by mouth daily.       No current facility-administered  medications for this visit.     Allergies (verified) Review of patient's allergies indicates no known allergies.   PAST HISTORY  Family History Family History  Problem Relation Age of Onset  . Pancreatic cancer Father   . Hypertension Mother   . Hypertension Father   . Diabetes Brother   . Cancer Daughter     Brain tumor    Social History History  Substance Use Topics  . Smoking status: Never Smoker   . Smokeless tobacco: Not on file  . Alcohol Use: No     Are there smokers in your home (other than you)? No  Risk Factors Current exercise habits: The patient does not  participate in regular exercise at present.  Dietary issues discussed: None   Cardiac risk factors: advanced age (older than 92 for men, 85 for women), obesity (BMI >= 30 kg/m2) and sedentary lifestyle.  Depression Screen (Note: if answer to either of the following is "Yes", a more complete depression screening is indicated)   Over the past two weeks, have you felt down, depressed or hopeless? No  Over the past two weeks, have you felt little interest or pleasure in doing things? No  Have you lost interest or pleasure in daily life? No  Do you often feel hopeless? No  Do you cry easily over simple problems? No  Activities of Daily Living In your present state of health, do you have any difficulty performing the following activities?:  Driving? No Managing money?  No Feeding yourself? No Getting from bed to chair? No  Climbing a flight of stairs? Yes Preparing food and eating?: No Bathing or showering? No Getting dressed: No Getting to the toilet? No Using the toilet:No Moving around from place to place: No In the past year have you fallen or had a near fall?:No   Hearing Difficulties: Yes Do you often ask people to speak up or repeat themselves? Yes Do you experience ringing or noises in your ears? Yes Do you have difficulty understanding soft or whispered voices? Yes   Do you feel that you have a problem with memory? No  Do you often misplace items? No  Do you feel safe at home?  Yes  Cognitive Testing  Alert? Yes  Normal Appearance?Yes  Oriented to person? Yes  Place? Yes   Time? Yes  Recall of three objects?  Yes  Can perform simple calculations? Yes  Displays appropriate judgment?Yes  Can read the correct time from a watch face?Yes    6CIT score of 3/28- neagive screen.     Advanced Directives have been discussed with the patient? Yes  List the Names of Other Physician/Practitioners you currently use: 1.    Indicate any recent Medical Services you may have  received from other than [facility] providers in the past year (date may be approximate).  Immunization History  Administered Date(s) Administered  . Influenza Split 10/18/2010, 09/26/2011  . Influenza,inj,Quad PF,36+ Mos 01/10/2013  . Pneumococcal Polysaccharide-23 01/13/2009  . Tdap 09/08/2013    Screening Tests Health Maintenance  Topic Date Due  . Mammogram  05/05/2013  . Influenza Vaccine  08/13/2013  . Tetanus/tdap  06/13/2016  . Colonoscopy  12/10/2020  . Pneumococcal Polysaccharide Vaccine Age 62 And Over  Addressed  . Zostavax  Completed    All answers were reviewed with the patient and necessary referrals were made:  Kaylin Schellenberg, MD   09/08/2013   History reviewed: allergies, current medications, past family history, past medical history, past social history, past  surgical history and problem list  Review of Systems A comprehensive review of systems was negative.    Objective:     Vision by Snellen chart: see vision notes   Body mass index is 40.24 kg/(m^2). BP 145/86  Pulse 70  Wt 257 lb (116.574 kg)  BP 145/86  Pulse 70  Wt 257 lb (116.574 kg) General appearance: alert, cooperative and appears stated age Head: Normocephalic, without obvious abnormality, atraumatic Eyes: conj clear, EOMI, PEERLA Ears: normal TM's and external ear canals both ears Nose: Nares normal. Septum midline. Mucosa normal. No drainage or sinus tenderness. Throat: lips, mucosa, and tongue normal; teeth and gums normal Neck: no adenopathy, no carotid bruit, no JVD, supple, symmetrical, trachea midline and thyroid not enlarged, symmetric, no tenderness/mass/nodules Back: symmetric, no curvature. ROM normal. No CVA tenderness. Lungs: clear to auscultation bilaterally Heart: regular rate and rhythm, S1, S2 normal, no murmur, click, rub or gallop Abdomen: soft, non-tender; bowel sounds normal; no masses,  no organomegaly Extremities: extremities normal, atraumatic, no cyanosis or  edema Pulses: 2+ and symmetric Skin: Skin color, texture, turgor normal. No rashes or lesions Lymph nodes: Cervical, supraclavicular, and axillary nodes normal. Neurologic: Alert and oriented X 3, normal strength and tone. Normal symmetric reflexes. Normal coordination and gait     Assessment:   Medicare Wellness Exam      Plan:     During the course of the visit the patient was educated and counseled about appropriate screening and preventive services including:    Pneumococcal vaccine   Influenza vaccine  Screening mammography  Bone densitometry screening  Refer to ENT for peristant nasal congestion even on nasal steorid, etc Feels like it is affecting her hearing etc.   Diet review for nutrition referral? Yes ____  Not Indicated _X_   Patient Instructions (the written plan) was given to the patient.  Medicare Attestation I have personally reviewed: The patient's medical and social history Their use of alcohol, tobacco or illicit drugs Their current medications and supplements The patient's functional ability including ADLs,fall risks, home safety risks, cognitive, and hearing and visual impairment Diet and physical activities Evidence for depression or mood disorders  The patient's weight, height, BMI, and visual acuity have been recorded in the chart.  I have made referrals, counseling, and provided education to the patient based on review of the above and I have provided the patient with a written personalized care plan for preventive services.     Samarth Ogle, MD   09/08/2013

## 2013-09-13 ENCOUNTER — Ambulatory Visit (INDEPENDENT_AMBULATORY_CARE_PROVIDER_SITE_OTHER): Payer: Medicare Other

## 2013-09-13 DIAGNOSIS — Z1231 Encounter for screening mammogram for malignant neoplasm of breast: Secondary | ICD-10-CM

## 2013-09-13 DIAGNOSIS — Z780 Asymptomatic menopausal state: Secondary | ICD-10-CM

## 2013-09-13 DIAGNOSIS — M949 Disorder of cartilage, unspecified: Secondary | ICD-10-CM

## 2013-09-13 DIAGNOSIS — M899 Disorder of bone, unspecified: Secondary | ICD-10-CM

## 2013-10-19 ENCOUNTER — Other Ambulatory Visit: Payer: Self-pay | Admitting: Family Medicine

## 2013-10-19 ENCOUNTER — Ambulatory Visit: Payer: Medicare Other | Admitting: Family Medicine

## 2013-12-09 ENCOUNTER — Ambulatory Visit: Payer: Medicare Other | Admitting: Family Medicine

## 2013-12-12 ENCOUNTER — Ambulatory Visit (INDEPENDENT_AMBULATORY_CARE_PROVIDER_SITE_OTHER): Payer: Medicare Other | Admitting: Family Medicine

## 2013-12-12 ENCOUNTER — Encounter: Payer: Self-pay | Admitting: Family Medicine

## 2013-12-12 VITALS — BP 140/88 | HR 120 | Wt 258.0 lb

## 2013-12-12 DIAGNOSIS — J209 Acute bronchitis, unspecified: Secondary | ICD-10-CM

## 2013-12-12 DIAGNOSIS — J4551 Severe persistent asthma with (acute) exacerbation: Secondary | ICD-10-CM

## 2013-12-12 MED ORDER — DOXYCYCLINE HYCLATE 100 MG PO TABS: 100.0000 mg | ORAL_TABLET | Freq: Two times a day (BID) | ORAL | Status: DC

## 2013-12-12 MED ORDER — PREDNISONE 20 MG PO TABS: 40.0000 mg | ORAL_TABLET | Freq: Every day | ORAL | Status: DC

## 2013-12-12 NOTE — Progress Notes (Signed)
   Subjective:    Patient ID: Briana Silva, female    DOB: 1945/07/21, 68 y.o.   MRN: [id number]  HPI Says about 1 month ago was in [place] and went to UC.  Says was given zpack and prednisone.  She is suing her Adviar.  2 weeks to start to feel better. Felt Ok fora bout one week and then started to feel bad again about a week ago.  Says has been coughing, fatigued.  Has been coughing in fits.  She has been using her albuterol about 3 times a day.  cough feels loose and productive. Thinks had a low grade temp.   + HA.  No sig nasal congestion.  Mild ST.    Has felt like has been getting more frquent lung infections and asthma flares over the last couple of years.  She did have a chest x-ray performed in [place] but does not have the results of that.  Review of Systems     Objective:   Physical Exam  Constitutional: She is oriented to person, place, and time. She appears well-developed and well-nourished.  HENT:  Head: Normocephalic and atraumatic.  Right Ear: External ear normal.  Left Ear: External ear normal.  Nose: Nose normal.  Mouth/Throat: Oropharynx is clear and moist.  TMs and canals are clear.   Eyes: Conjunctivae and EOM are normal. Pupils are equal, round, and reactive to light.  Neck: Neck supple. No thyromegaly present.  Cardiovascular: Normal rate, regular rhythm and normal heart sounds.   Pulmonary/Chest: Effort normal and breath sounds normal. She has no wheezes.  Lymphadenopathy:    She has no cervical adenopathy.  Neurological: She is alert and oriented to person, place, and time.  Skin: Skin is warm and dry.  Psychiatric: She has a normal mood and affect.          Assessment & Plan:  Acute bronchitis-gave reassurance. Because she has been persistently ill for a month going to go ahead and put her on antibiotic.  Asthma exacerbation-old with antibiotics and short course of prednisone. If she's not improving pretty quickly then please let me know. Continue  to be aggressive with albuterol. Continue Advair.  Schedule spirometry in 2 months.

## 2013-12-13 ENCOUNTER — Telehealth: Payer: Self-pay | Admitting: *Deleted

## 2013-12-14 ENCOUNTER — Telehealth: Payer: Self-pay | Admitting: Family Medicine

## 2013-12-14 NOTE — Telephone Encounter (Signed)
Please call patient and let her know that we did get a copy of the report of her chest x-ray that was done in [place]. It basically just showed some hyperexpansion consistent with air trapping but no pneumonia nodules etc. There was no extra fluid. It was otherwise normal. The air trapping can happen with asthma exacerbation.

## 2013-12-15 NOTE — Telephone Encounter (Signed)
Patient advised.

## 2013-12-27 ENCOUNTER — Other Ambulatory Visit: Payer: Self-pay | Admitting: Family Medicine

## 2013-12-27 LAB — COMPLETE METABOLIC PANEL WITH GFR
AST: 16 U/L (ref 0–37)
Albumin: 3.6 g/dL (ref 3.5–5.2)
Alkaline Phosphatase: 77 U/L (ref 39–117)
BUN: 10 mg/dL (ref 6–23)
CALCIUM: 9.3 mg/dL (ref 8.4–10.5)
CHLORIDE: 100 meq/L (ref 96–112)
CO2: 28 mEq/L (ref 19–32)
CREATININE: 0.71 mg/dL (ref 0.50–1.10)
GFR, Est African American: >89 mL/min
GFR, Est Non African American: 87 mL/min
Glucose, Bld: 95 mg/dL (ref 70–99)
Potassium: 4 mEq/L (ref 3.5–5.3)
SODIUM: 137 meq/L (ref 135–145)
TOTAL PROTEIN: 6.9 g/dL (ref 6.0–8.3)
Total Bilirubin: 0.6 mg/dL (ref 0.2–1.2)

## 2013-12-27 LAB — LIPID PANEL
Cholesterol: 195 mg/dL (ref 0–200)
HDL: 67 mg/dL (ref >39)
LDL CALC: 115 mg/dL — AB (ref 0–99)
Total CHOL/HDL Ratio: 2.9 Ratio
Triglycerides: 64 mg/dL (ref <150)
VLDL: 13 mg/dL (ref 0–40)

## 2014-01-19 NOTE — Telephone Encounter (Signed)
error 

## 2014-01-21 ENCOUNTER — Other Ambulatory Visit: Payer: Self-pay | Admitting: Family Medicine

## 2014-02-10 ENCOUNTER — Encounter: Payer: Self-pay | Admitting: Family Medicine

## 2014-02-10 ENCOUNTER — Ambulatory Visit (INDEPENDENT_AMBULATORY_CARE_PROVIDER_SITE_OTHER): Payer: Medicare Other | Admitting: Family Medicine

## 2014-02-10 VITALS — BP 112/71 | HR 96 | Wt 259.0 lb

## 2014-02-10 DIAGNOSIS — J45901 Unspecified asthma with (acute) exacerbation: Secondary | ICD-10-CM | POA: Diagnosis not present

## 2014-02-10 MED ORDER — FLUTICASONE-SALMETEROL 250-50 MCG/DOSE IN AEPB: 1.0000 | INHALATION_SPRAY | Freq: Two times a day (BID) | RESPIRATORY_TRACT | Status: DC

## 2014-02-10 MED ORDER — ALBUTEROL SULFATE (2.5 MG/3ML) 0.083% IN NEBU
2.5000 mg | INHALATION_SOLUTION | Freq: Once | RESPIRATORY_TRACT | Status: AC
  Administered 2014-02-10: 2.5 mg via RESPIRATORY_TRACT

## 2014-02-10 NOTE — Progress Notes (Signed)
   Subjective:    Patient ID: Briana Silva, female    DOB: [birth year], 69 y.o.   MRN: [id number]  HPI  patient is here today for spirometry. She has a diagnosis of asthma and had had several flares over a short period of time. She was recently treated for acute bronchitis in November. Using albuterol 2-3 times a week for the last couple of weeks. No nightime sxs.   She has never been a smoker.   Review of Systems     Objective:   Physical Exam  Constitutional: She is oriented to person, place, and time. She appears well-developed and well-nourished.  HENT:  Head: Normocephalic and atraumatic.  Cardiovascular: Normal rate, regular rhythm and normal heart sounds.   Pulmonary/Chest: Effort normal and breath sounds normal.  Neurological: She is alert and oriented to person, place, and time.  Skin: Skin is warm and dry.  Psychiatric: She has a normal mood and affect. Her behavior is normal.          Assessment & Plan:   reviewed  Spirometry results today. FVC of 85%, FEV1 of 84% and ratio of 75%. No significant change with albuterol. She reported that she had used her albuterol earlier this morning.  Asthma, moderate persistant.  Will inc Advair to 250/50.  New rx printed. F/U in 3 mo.

## 2014-05-01 ENCOUNTER — Other Ambulatory Visit: Payer: Self-pay | Admitting: Family Medicine

## 2014-05-12 ENCOUNTER — Ambulatory Visit: Payer: Medicare Other | Admitting: Family Medicine

## 2014-06-13 ENCOUNTER — Telehealth: Payer: Self-pay | Admitting: Family Medicine

## 2014-06-13 MED ORDER — FLUTICASONE-SALMETEROL 250-50 MCG/DOSE IN AEPB: 1.0000 | INHALATION_SPRAY | Freq: Two times a day (BID) | RESPIRATORY_TRACT | Status: DC

## 2014-06-13 MED ORDER — FUROSEMIDE 40 MG PO TABS: 40.0000 mg | ORAL_TABLET | Freq: Every day | ORAL | Status: DC

## 2014-06-13 NOTE — Telephone Encounter (Signed)
Patient called clinic, states she had to reschedule her appt but is out of her lasix and already has begun to swell. Advised I would send over a refill but stressed importance of keeping her new f/u appt. Pt states she is also out of her inhaler, advised I would send over 1 but she needs to keep her appt. Verbalized understanding.

## 2014-06-22 ENCOUNTER — Encounter: Payer: Self-pay | Admitting: Family Medicine

## 2014-06-22 ENCOUNTER — Ambulatory Visit (INDEPENDENT_AMBULATORY_CARE_PROVIDER_SITE_OTHER): Payer: Medicare Other | Admitting: Family Medicine

## 2014-06-22 VITALS — BP 130/79 | HR 103 | Wt 261.0 lb

## 2014-06-22 DIAGNOSIS — J309 Allergic rhinitis, unspecified: Secondary | ICD-10-CM

## 2014-06-22 DIAGNOSIS — J4540 Moderate persistent asthma, uncomplicated: Secondary | ICD-10-CM

## 2014-06-22 DIAGNOSIS — H9191 Unspecified hearing loss, right ear: Secondary | ICD-10-CM

## 2014-06-22 DIAGNOSIS — I10 Essential (primary) hypertension: Secondary | ICD-10-CM | POA: Diagnosis not present

## 2014-06-22 DIAGNOSIS — Z23 Encounter for immunization: Secondary | ICD-10-CM

## 2014-06-22 MED ORDER — FUROSEMIDE 40 MG PO TABS: 40.0000 mg | ORAL_TABLET | Freq: Every day | ORAL | Status: DC

## 2014-06-22 MED ORDER — FLUTICASONE-SALMETEROL 250-50 MCG/DOSE IN AEPB: 1.0000 | INHALATION_SPRAY | Freq: Two times a day (BID) | RESPIRATORY_TRACT | Status: DC

## 2014-06-22 MED ORDER — ALBUTEROL SULFATE (2.5 MG/3ML) 0.083% IN NEBU: 2.5000 mg | INHALATION_SOLUTION | RESPIRATORY_TRACT | Status: DC | PRN

## 2014-06-22 MED ORDER — ALBUTEROL SULFATE HFA 108 (90 BASE) MCG/ACT IN AERS: 2.0000 | INHALATION_SPRAY | RESPIRATORY_TRACT | Status: DC | PRN

## 2014-06-22 MED ORDER — CARVEDILOL 12.5 MG PO TABS: 12.5000 mg | ORAL_TABLET | Freq: Two times a day (BID) | ORAL | Status: DC

## 2014-06-22 MED ORDER — FLUTICASONE PROPIONATE 50 MCG/ACT NA SUSP: 2.0000 | Freq: Every day | NASAL | Status: DC

## 2014-06-22 MED ORDER — CETIRIZINE HCL 10 MG PO TABS: 10.0000 mg | ORAL_TABLET | Freq: Every day | ORAL | Status: AC | PRN

## 2014-06-22 NOTE — Progress Notes (Signed)
   Subjective:    Patient ID: Briana Silva, female    DOB: [birth year], 69 y.o.   MRN: [id number]  HPI F/U asthma - She [place] is using her Advair daily. She has used her albuterol 2-3 times per week. Used it this morning as she was around a flowers that triggered her sxs.    AR- feels like her allergies are managable. She is congested this AM. He is using her flonase.    Hearing loss in left  ear for about 4 years.  Has seen ENT and they are not sure why she has hearing loss.  She wants to know if she could have a CT to check it out.    Review of Systems     Objective:   Physical Exam  Constitutional: She is oriented to person, place, and time. She appears well-developed and well-nourished.  HENT:  Head: Normocephalic and atraumatic.  Cardiovascular: Normal rate, regular rhythm and normal heart sounds.   Pulmonary/Chest: Effort normal and breath sounds normal.  Neurological: She is alert and oriented to person, place, and time.  Skin: Skin is warm and dry.  Psychiatric: She has a normal mood and affect. Her behavior is normal.          Assessment & Plan:  Asthma, moderate persistant - continue current regimen per fall up in 3 months. [place] at that point time she'll do well and we can decrease the dose on her Advair.  Allergic rhinitis-stable on current regimen. She is using her Flonase. I did encourage her decrease down to 1 spray in each nostril 72 sprays in each nostril.  Hearing loss-unclear. She did see ENT last year so we are going to call [place] [facility] and see if we can get that last record of what was done. They had requested that she follow back up but she did not because she felt like they want really doing anything for her. We'll try to see if we can figure out what the next step would be for her hearing loss.  CHF - she has a diagnosis of congestive heart failure. She was started on a beta blocker several years ago. And decided to stop it on her own.  When I look back in the chart it looks like the last time was refilled with in 2013. We discussed getting back on this today because of her history. She was agreeable to restart the medication. New prescription sent to the pharmacy. Prenvar 13 given today.

## 2014-09-22 ENCOUNTER — Encounter: Payer: Self-pay | Admitting: Family Medicine

## 2014-09-22 ENCOUNTER — Ambulatory Visit (INDEPENDENT_AMBULATORY_CARE_PROVIDER_SITE_OTHER): Payer: Medicare Other | Admitting: Family Medicine

## 2014-09-22 VITALS — BP 129/81 | HR 83 | Temp 97.0°F | Wt 269.0 lb

## 2014-09-22 DIAGNOSIS — J4521 Mild intermittent asthma with (acute) exacerbation: Secondary | ICD-10-CM | POA: Diagnosis not present

## 2014-09-22 DIAGNOSIS — E669 Obesity, unspecified: Secondary | ICD-10-CM

## 2014-09-22 DIAGNOSIS — J45901 Unspecified asthma with (acute) exacerbation: Secondary | ICD-10-CM

## 2014-09-22 DIAGNOSIS — I509 Heart failure, unspecified: Secondary | ICD-10-CM

## 2014-09-22 MED ORDER — FLUTICASONE-SALMETEROL 100-50 MCG/DOSE IN AEPB: 1.0000 | INHALATION_SPRAY | Freq: Two times a day (BID) | RESPIRATORY_TRACT | Status: DC

## 2014-09-22 NOTE — Progress Notes (Signed)
   Subjective:    Patient ID: Briana Silva, female    DOB: 1945-08-22, 69 y.o.   MRN: [id number]  HPI CHF - she did try the carvedilol and felt bad on it.  She says it gave her some chest heaviness and arm heaviness.  So stopped it.  Will add to her intolerance list.  Obesity/BMI 42 she says would like to come in every 2 weeks for a weight in. She feels like if she has something to really work towards an focus on that she will be more successful. She admits she has been eating things that she know she shouldn't. But she is ready to get back on track.  She would ultimately like to lose about 75 pounds.  Asthma - has only had ot use her albuter on once in the last 2 weeks. Right now she is using her Advair 250 daily. She recently bought a house here and her bedroom be on the first floor and all the floors will be non-carpeted which she is hoping will make a big difference in her breathing is well.  Review of Systems     Objective:   Physical Exam  Constitutional: She is oriented to person, place, and time. She appears well-developed and well-nourished.  HENT:  Head: Normocephalic and atraumatic.  Cardiovascular: Normal rate, regular rhythm and normal heart sounds.   Pulmonary/Chest: Effort normal and breath sounds normal.  Musculoskeletal: She exhibits no edema.  Neurological: She is alert and oriented to person, place, and time.  Skin: Skin is warm and dry.  Psychiatric: She has a normal mood and affect. Her behavior is normal.          Assessment & Plan:  Asthma - overall doing really well. Mild intermittent. Will decrease her Advair to the 100. 1 puff twice a day. She still has some refills on the 250s so she feels like she is increasing her Inderal use she can go back up. Otherwise I'll see her back in 6 months.  CHF - overall she's been stable. Normal volume status. She says she wants to get back in with a cardiologist. Maryclare Labrador hold off on starting a new medication. His  diagnosis was put on her chart back in 2012 a really don't have any historical record such as an echo etc.  Obesity/BMI 42 - discussed changes in diet. Also recommended using online resources to help her set caloric goals and to keep track of calories and do calorie counting. Follow-up every 2 weeks for way in. Not currently treating with a weight loss medication. She would really do well if she could lose down to 200 pounds.

## 2014-10-06 ENCOUNTER — Ambulatory Visit: Payer: Medicare Other

## 2014-11-22 NOTE — Progress Notes (Signed)
     HPI: 69 year old female for evaluation of congestive heart failure.   Current Outpatient Prescriptions  Medication Sig Dispense Refill  . albuterol (PROAIR HFA) 108 (90 BASE) MCG/ACT inhaler Inhale 2 puffs into the lungs every 4 (four) hours as needed for wheezing or shortness of breath. 6.7 g 5  . albuterol (PROVENTIL) (2.5 MG/3ML) 0.083% nebulizer solution Take 3 mLs (2.5 mg total) by nebulization every 4 (four) hours as needed for wheezing or shortness of breath. 75 mL 6  . carvedilol (COREG) 12.5 MG tablet Take 1 tablet (12.5 mg total) by mouth 2 (two) times daily with a meal. 180 tablet 3  . cetirizine (ZYRTEC) 10 MG tablet Take 1 tablet (10 mg total) by mouth daily as needed for allergies or rhinitis. 30 tablet 5  . fluticasone (FLONASE) 50 MCG/ACT nasal spray Place 2 sprays into both nostrils daily. 16 g 6  . Fluticasone-Salmeterol (ADVAIR DISKUS) 100-50 MCG/DOSE AEPB Inhale 1 puff into the lungs 2 (two) times daily. 60 each 5  . furosemide (LASIX) 40 MG tablet Take 1 tablet (40 mg total) by mouth daily. 30 tablet 5  . Omega-3 Fatty Acids (FISH OIL) 1000 MG CAPS Take 1 capsule by mouth daily.     No current facility-administered medications for this visit.    Allergies  Allergen Reactions  . Carvedilol Other (See Comments)    Chest heaviness, felt like limbs were heavy as well.     Past Medical History  Diagnosis Date  . Allergy   . Asthma   . CHF (congestive heart failure)   . Depression   . Obesity     Past Surgical History  Procedure Laterality Date  . Tubal ligation  1967  . Left oophorectomy  1967    Social History   Social History  . Marital Status: Widowed    Spouse Name: N/A  . Number of Children: 2  . Years of Education: N/A   Occupational History  . Retired.     Social History Main Topics  . Smoking status: Never Smoker   . Smokeless tobacco: Not on file  . Alcohol Use: No  . Drug Use: No  . Sexual Activity: Not on file   Other Topics  Concern  . Not on file   Social History Narrative   No regular exercise.  Going to a nutrition center.     Family History  Problem Relation Age of Onset  . Pancreatic cancer Father   . Hypertension Mother   . Hypertension Father   . Diabetes Brother   . Cancer Daughter     Brain tumor    ROS: no fevers or chills, productive cough, hemoptysis, dysphasia, odynophagia, melena, hematochezia, dysuria, hematuria, rash, seizure activity, orthopnea, PND, pedal edema, claudication. Remaining systems are negative.  Physical Exam:   There were no vitals taken for this visit.  General:  Well developed/well nourished in NAD Skin warm/dry Patient not depressed No peripheral clubbing Back-normal HEENT-normal/normal eyelids Neck supple/normal carotid upstroke bilaterally; no bruits; no JVD; no thyromegaly chest - CTA/ normal expansion CV - RRR/normal S1 and S2; no murmurs, rubs or gallops;  PMI nondisplaced Abdomen -NT/ND, no HSM, no mass, + bowel sounds, no bruit 2+ femoral pulses, no bruits Ext-no edema, chords, 2+ DP Neuro-grossly nonfocal  ECG    This encounter was created in error - please disregard.

## 2014-11-29 ENCOUNTER — Encounter: Payer: Medicare Other | Admitting: Cardiology

## 2015-01-03 ENCOUNTER — Encounter: Payer: Self-pay | Admitting: *Deleted

## 2015-01-16 NOTE — Progress Notes (Signed)
HPI: 70 yo female for evaluation of CHF. Patient apparently had congestive heart failure in [place] approximately 8 years ago. She was told her heart was functioning at "30%". She was treated with medications with improvement. She moved to this area and was seen by a cardiologist in [place]. She had a stress test that apparently was negative but records are not available. We are now asked to evaluate. She has some dyspnea on exertion and orthopnea. She has occasional mild pedal edema. No chest pain, palpitations or syncope.  Current Outpatient Prescriptions  Medication Sig Dispense Refill  . albuterol (PROAIR HFA) 108 (90 BASE) MCG/ACT inhaler Inhale 2 puffs into the lungs every 4 (four) hours as needed for wheezing or shortness of breath. 6.7 g 5  . albuterol (PROVENTIL) (2.5 MG/3ML) 0.083% nebulizer solution Take 3 mLs (2.5 mg total) by nebulization every 4 (four) hours as needed for wheezing or shortness of breath. 75 mL 6  . cetirizine (ZYRTEC) 10 MG tablet Take 1 tablet (10 mg total) by mouth daily as needed for allergies or rhinitis. 30 tablet 5  . fluticasone (FLONASE) 50 MCG/ACT nasal spray Place 2 sprays into both nostrils daily. 16 g 6  . Fluticasone-Salmeterol (ADVAIR DISKUS) 100-50 MCG/DOSE AEPB Inhale 1 puff into the lungs 2 (two) times daily. 60 each 5  . furosemide (LASIX) 40 MG tablet Take 1 tablet (40 mg total) by mouth daily. 30 tablet 5  . Omega-3 Fatty Acids (FISH OIL) 1000 MG CAPS Take 1 capsule by mouth daily.     No current facility-administered medications for this visit.    Allergies  Allergen Reactions  . Carvedilol Other (See Comments)    Chest heaviness, felt like limbs were heavy as well.      Past Medical History  Diagnosis Date  . Allergy   . Asthma   . CHF (congestive heart failure) (HCC)   . Depression   . Obesity   . Hypertension   . Hyperlipidemia     Past Surgical History  Procedure Laterality Date  . Tubal ligation  1967    . Left oophorectomy  1967    Social History   Social History  . Marital Status: Widowed    Spouse Name: N/A  . Number of Children: 2  . Years of Education: N/A   Occupational History  . Retired.     Social History Main Topics  . Smoking status: Never Smoker   . Smokeless tobacco: Not on file  . Alcohol Use: No  . Drug Use: No  . Sexual Activity: Not on file   Other Topics Concern  . Not on file   Social History Narrative   No regular exercise.  Going to a nutrition center.     Family History  Problem Relation Age of Onset  . Pancreatic cancer Father   . Hypertension Mother   . Hypertension Father   . Diabetes Brother   . Cancer Daughter [phone]    Brain tumor  . Alzheimer's disease Mother   . Non-Hodgkin's lymphoma Brother   . Leukemia Brother   . Other Sister     brain tumor    ROS: Occasional productive cough but no fevers or chills, hemoptysis, dysphasia, odynophagia, melena, hematochezia, dysuria, hematuria, rash, seizure activity, PND, claudication. Remaining systems are negative.  Physical Exam:   Blood pressure 140/94, pulse 118, height 5\' 7"  (1.702 m), weight 267 lb 1.3 oz (121.147 kg).  General:  Well developed/obese in NAD Skin  warm/dry Patient not depressed No peripheral clubbing Back-normal HEENT-normal/normal eyelids Neck supple/normal carotid upstroke bilaterally; no bruits; no JVD; no thyromegaly chest - CTA/ normal expansion CV - Tachycardic, regular rhythm/normal S1 and S2; no murmurs, rubs or gallops;  PMI nondisplaced Abdomen -NT/ND, no HSM, no mass, + bowel sounds, no bruit 2+ femoral pulses, no bruits Ext-1+ ankle edema, chords, 2+ DP Neuro-grossly nonfocal  ECG Sinus tachycardia at a rate of 118. Normal axis. No ST changes.

## 2015-01-17 ENCOUNTER — Encounter: Payer: Self-pay | Admitting: Cardiology

## 2015-01-17 ENCOUNTER — Ambulatory Visit (INDEPENDENT_AMBULATORY_CARE_PROVIDER_SITE_OTHER): Payer: Medicare Other | Admitting: Cardiology

## 2015-01-17 VITALS — BP 140/94 | HR 118 | Ht 67.0 in | Wt 267.1 lb

## 2015-01-17 DIAGNOSIS — I10 Essential (primary) hypertension: Secondary | ICD-10-CM | POA: Diagnosis not present

## 2015-01-17 DIAGNOSIS — I509 Heart failure, unspecified: Secondary | ICD-10-CM

## 2015-01-17 DIAGNOSIS — I5022 Chronic systolic (congestive) heart failure: Secondary | ICD-10-CM

## 2015-01-17 MED ORDER — METOPROLOL SUCCINATE ER 25 MG PO TB24: 25.0000 mg | ORAL_TABLET | Freq: Every day | ORAL | Status: DC

## 2015-01-17 NOTE — Assessment & Plan Note (Signed)
Continue present medications. 

## 2015-01-17 NOTE — Assessment & Plan Note (Addendum)
Patient with history of congestive heart failure.We will obtain records from her cardiologist in [place]. Plan echocardiogram to assess LV function. She apparently had reduced systolic function previously. She was on an ACE inhibitor and carvedilol but discontinued these. I will add Toprol 25 mg daily. We will make further adjustments and additions in medications once echocardiogram is repeated and LV function known. Check TSH. She did apparently have a stress test in [place] and we will obtain those records. 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 Continue present dose of Lasix.

## 2015-01-17 NOTE — Patient Instructions (Signed)
Medication Instructions:   START METOPROLOL SUCC ER 25 MG ONCE DAILY AT BEDTIME  Labwork:  Your physician recommends that you HAVE LAB WORK TODAY  Testing/Procedures:  Your physician has requested that you have an echocardiogram. Echocardiography is a painless test that uses sound waves to create images of your heart. It provides your doctor with information about the size and shape of your heart and how well your heart's chambers and valves are working. This procedure takes approximately one hour. There are no restrictions for this procedure.    Follow-Up:  Your physician recommends that you schedule a follow-up appointment in: 2-4 WEEKS WITH DR Jens SomRENSHAW

## 2015-01-17 NOTE — Assessment & Plan Note (Signed)
Blood pressure is elevated today.She does follow this at home and states it is typically controlled. Add Toprol 25 mg daily. Follow blood pressure at home and adjust regimen as needed.

## 2015-01-18 LAB — TSH: TSH: 1.518 u[IU]/mL (ref 0.350–4.500)

## 2015-01-24 ENCOUNTER — Ambulatory Visit (HOSPITAL_BASED_OUTPATIENT_CLINIC_OR_DEPARTMENT_OTHER)
Admission: RE | Admit: 2015-01-24 | Discharge: 2015-01-24 | Disposition: A | Discharge status: Home / Self Care | Payer: Medicare Other | Source: Ambulatory Visit | Attending: Cardiology | Admitting: Cardiology

## 2015-01-24 DIAGNOSIS — I509 Heart failure, unspecified: Secondary | ICD-10-CM | POA: Diagnosis not present

## 2015-01-24 DIAGNOSIS — I10 Essential (primary) hypertension: Secondary | ICD-10-CM | POA: Diagnosis not present

## 2015-01-24 DIAGNOSIS — E785 Hyperlipidemia, unspecified: Secondary | ICD-10-CM | POA: Diagnosis not present

## 2015-01-24 NOTE — Progress Notes (Signed)
  Echocardiogram 2D Echocardiogram has been performed.  Cathie BeamsGREGORY, Caydan Mctavish 01/24/2015, 3:52 PM

## 2015-02-13 NOTE — Progress Notes (Signed)
HPI: FU CHF. Patient apparently had congestive heart failure in [place] approximately 8 years ago. She was told her heart was functioning at "30%". She was treated with medications with improvement. She moved to this area and was seen by a cardiologist in [place]. She had a stress test that apparently was negative but records are not available. Echocardiogram January 2017 showed normal LV systolic function, mild left ventricular hypertrophy and biatrial enlargement. Since last seen She has occasional mild dyspnea on exertion. No orthopnea or PND. Occasional mild pedal edema. Occasional vague chest discomfort both with activities and rest. No syncope.  Current Outpatient Prescriptions  Medication Sig Dispense Refill  . albuterol (PROAIR HFA) 108 (90 BASE) MCG/ACT inhaler Inhale 2 puffs into the lungs every 4 (four) hours as needed for wheezing or shortness of breath. 6.7 g 5  . albuterol (PROVENTIL) (2.5 MG/3ML) 0.083% nebulizer solution Take 3 mLs (2.5 mg total) by nebulization every 4 (four) hours as needed for wheezing or shortness of breath. 75 mL 6  . cetirizine (ZYRTEC) 10 MG tablet Take 1 tablet (10 mg total) by mouth daily as needed for allergies or rhinitis. 30 tablet 5  . fluticasone (FLONASE) 50 MCG/ACT nasal spray Place 2 sprays into both nostrils daily. 16 g 6  . Fluticasone-Salmeterol (ADVAIR DISKUS) 100-50 MCG/DOSE AEPB Inhale 1 puff into the lungs 2 (two) times daily. 60 each 5  . furosemide (LASIX) 40 MG tablet Take 1 tablet (40 mg total) by mouth daily. 30 tablet 5  . metoprolol succinate (TOPROL XL) 25 MG 24 hr tablet Take 1 tablet (25 mg total) by mouth daily. 90 tablet 3  . Omega-3 Fatty Acids (FISH OIL) 1000 MG CAPS Take 1 capsule by mouth daily.     No current facility-administered medications for this visit.     Past Medical History  Diagnosis Date  . Allergy   . Asthma   . CHF (congestive heart failure) (HCC)   . Depression   . Obesity   .  Hypertension   . Hyperlipidemia     Past Surgical History  Procedure Laterality Date  . Tubal ligation  1967  . Left oophorectomy  1967    Social History   Social History  . Marital Status: Widowed    Spouse Name: N/A  . Number of Children: 2  . Years of Education: N/A   Occupational History  . Retired.     Social History Main Topics  . Smoking status: Never Smoker   . Smokeless tobacco: Not on file  . Alcohol Use: No  . Drug Use: No  . Sexual Activity: Not on file   Other Topics Concern  . Not on file   Social History Narrative   No regular exercise.  Going to a nutrition center.     Family History  Problem Relation Age of Onset  . Pancreatic cancer Father   . Hypertension Mother   . Hypertension Father   . Diabetes Brother   . Cancer Daughter 31    Brain tumor  . Alzheimer's disease Mother   . Non-Hodgkin's lymphoma Brother   . Leukemia Brother   . Other Sister     brain tumor    ROS: Patient complains of fatigue but no fevers or chills, productive cough, hemoptysis, dysphasia, odynophagia, melena, hematochezia, dysuria, hematuria, rash, seizure activity, orthopnea, PND, pedal edema, claudication. Remaining systems are negative.  Physical Exam: Well-developed well-nourished in no acute distress.  Skin is warm and  dry.  HEENT is normal.  Neck is supple.  Chest is clear to auscultation with normal expansion.  Cardiovascular exam is regular rate and rhythm.  Abdominal exam nontender or distended. No masses palpated. Extremities show no edema. neuro grossly intact  ECG Sinus rhythm at a rate of 69.No ST changes.

## 2015-02-14 ENCOUNTER — Ambulatory Visit (INDEPENDENT_AMBULATORY_CARE_PROVIDER_SITE_OTHER): Payer: Medicare Other | Admitting: Cardiology

## 2015-02-14 ENCOUNTER — Encounter: Payer: Self-pay | Admitting: Cardiology

## 2015-02-14 VITALS — BP 132/88 | HR 70 | Ht 67.0 in | Wt 263.8 lb

## 2015-02-14 DIAGNOSIS — I10 Essential (primary) hypertension: Secondary | ICD-10-CM

## 2015-02-14 DIAGNOSIS — I5032 Chronic diastolic (congestive) heart failure: Secondary | ICD-10-CM | POA: Diagnosis not present

## 2015-02-14 NOTE — Patient Instructions (Signed)
Your physician wants you to follow-up in: 6 MONTHS WITH DR CRENSHAW You will receive a reminder letter in the mail two months in advance. If you don't receive a letter, please call our office to schedule the follow-up appointment.  

## 2015-02-14 NOTE — Assessment & Plan Note (Signed)
Management per primary care. 

## 2015-02-14 NOTE — Assessment & Plan Note (Signed)
Patient is keeping blood pressure records at home and her blood pressure is well controlled. She is complaining of fatigue and I wonder if the beta blocker is contributing. She will continue Toprol for now. In the next 2-4 weeks if her fatigue persists we will try a different medications for blood pressure control such as an ACE inhibitor.

## 2015-02-14 NOTE — Assessment & Plan Note (Signed)
We do not have previous records. However repeat echocardiogram here shows normal LV function. Question history of diastolic heart failure. She is euvolemic on exam. Continue present dose of Lasix.

## 2015-03-29 ENCOUNTER — Other Ambulatory Visit: Payer: Self-pay | Admitting: Family Medicine

## 2015-04-05 ENCOUNTER — Ambulatory Visit: Payer: Medicare Other | Admitting: Family Medicine

## 2015-06-25 ENCOUNTER — Encounter: Payer: Self-pay | Admitting: Family Medicine

## 2015-06-25 ENCOUNTER — Ambulatory Visit (INDEPENDENT_AMBULATORY_CARE_PROVIDER_SITE_OTHER): Payer: Medicare Other | Admitting: Family Medicine

## 2015-06-25 VITALS — BP 132/81 | HR 96 | Wt 277.0 lb

## 2015-06-25 DIAGNOSIS — J301 Allergic rhinitis due to pollen: Secondary | ICD-10-CM

## 2015-06-25 DIAGNOSIS — J4541 Moderate persistent asthma with (acute) exacerbation: Secondary | ICD-10-CM

## 2015-06-25 DIAGNOSIS — R7301 Impaired fasting glucose: Secondary | ICD-10-CM | POA: Diagnosis not present

## 2015-06-25 DIAGNOSIS — I5032 Chronic diastolic (congestive) heart failure: Secondary | ICD-10-CM | POA: Diagnosis not present

## 2015-06-25 DIAGNOSIS — E785 Hyperlipidemia, unspecified: Secondary | ICD-10-CM | POA: Diagnosis not present

## 2015-06-25 DIAGNOSIS — I509 Heart failure, unspecified: Secondary | ICD-10-CM | POA: Diagnosis not present

## 2015-06-25 MED ORDER — FLUTICASONE-SALMETEROL 250-50 MCG/DOSE IN AEPB: 1.0000 | INHALATION_SPRAY | Freq: Two times a day (BID) | RESPIRATORY_TRACT | Status: DC

## 2015-06-25 MED ORDER — ALBUTEROL SULFATE HFA 108 (90 BASE) MCG/ACT IN AERS: 2.0000 | INHALATION_SPRAY | RESPIRATORY_TRACT | Status: DC | PRN

## 2015-06-25 MED ORDER — FLUTICASONE-SALMETEROL 100-50 MCG/DOSE IN AEPB: 1.0000 | INHALATION_SPRAY | Freq: Two times a day (BID) | RESPIRATORY_TRACT | Status: DC

## 2015-06-25 MED ORDER — ALBUTEROL SULFATE (2.5 MG/3ML) 0.083% IN NEBU: 2.5000 mg | INHALATION_SOLUTION | RESPIRATORY_TRACT | Status: DC | PRN

## 2015-06-25 MED ORDER — PREDNISONE 20 MG PO TABS: 40.0000 mg | ORAL_TABLET | Freq: Every day | ORAL | Status: DC

## 2015-06-25 MED ORDER — FUROSEMIDE 40 MG PO TABS: 40.0000 mg | ORAL_TABLET | Freq: Every day | ORAL | Status: DC

## 2015-06-25 NOTE — Progress Notes (Signed)
Subjective:    CC: Asthma   HPI:  Here for follow-up asthma-she has struggled a little bit more over the last 2 weeks. She said she had about 3 days last week show most called to come in because she was using her albuterol almost 3 times a day. She says it seems to have settled down. She has had some increased sputum production and cough. No fevers chills or sweats.  CHF- stable. No recent exacerbations. Follows up with cardiology about 7 months.  Allergic rhinitis she does still take Zyrtec daily and uses her Flonase.  Past medical history, Surgical history, Family history not pertinant except as noted below, Social history, Allergies, and medications have been entered into the medical record, reviewed, and corrections made.   Review of Systems: No fevers, chills, night sweats, weight loss, chest pain. + shortness of breath.   Objective:    General: Well Developed, well nourished, and in no acute distress.  Neuro: Alert and oriented x3, extra-ocular muscles intact, sensation grossly intact.  HEENT: Normocephalic, atraumatic  Skin: Warm and dry, no rashes. Cardiac: Regular rate and rhythm, no murmurs rubs or gallops, no lower extremity edema.  Respiratory: Clear to auscultation bilaterally. Not using accessory muscles, speaking in full sentences.   Impression and Recommendations:   Asthma exacerbation-given 5 days of prednisone. Will increase Advair to 250. I'll see her back in 3 months if she's doing on that time and we can decrease her dose. Also encouraged her to call her insurance when she gets a chance to see if one of the other inhalers might be preferred to save her some money. If cough persists and is not improving them please let me know. Next  Congestive heart failure-due for CMP and fasting lipid panel. She's never had an A1c so we will do that as well.  Allergic rhinitis-continue with Zyrtec and Flonase.

## 2015-06-26 LAB — COMPLETE METABOLIC PANEL WITH GFR
ALK PHOS: 76 U/L (ref 33–130)
ALT: 11 U/L (ref 6–29)
AST: 17 U/L (ref 10–35)
Albumin: 3.8 g/dL (ref 3.6–5.1)
BUN: 12 mg/dL (ref 7–25)
CO2: 30 mmol/L (ref 20–31)
Calcium: 9 mg/dL (ref 8.6–10.4)
Chloride: 101 mmol/L (ref 98–110)
Creat: 0.76 mg/dL (ref 0.60–0.93)
GFR, EST NON AFRICAN AMERICAN: 79 mL/min (ref >=60)
GLUCOSE: 87 mg/dL (ref 65–99)
POTASSIUM: 4.6 mmol/L (ref 3.5–5.3)
SODIUM: 138 mmol/L (ref 135–146)
Total Bilirubin: 0.6 mg/dL (ref 0.2–1.2)
Total Protein: 7.2 g/dL (ref 6.1–8.1)

## 2015-06-26 LAB — HEMOGLOBIN A1C
Hgb A1c MFr Bld: 5.8 % — ABNORMAL HIGH (ref <5.7)
MEAN PLASMA GLUCOSE: 120 mg/dL

## 2015-06-26 LAB — LIPID PANEL
CHOL/HDL RATIO: 2.2 ratio (ref <=5.0)
Cholesterol: 180 mg/dL (ref 125–200)
HDL: 81 mg/dL (ref >=46)
LDL CALC: 89 mg/dL (ref <130)
TRIGLYCERIDES: 52 mg/dL (ref <150)
VLDL: 10 mg/dL (ref <30)

## 2015-06-27 ENCOUNTER — Encounter: Payer: Self-pay | Admitting: Family Medicine

## 2015-06-27 ENCOUNTER — Other Ambulatory Visit: Payer: Self-pay | Admitting: *Deleted

## 2015-06-27 DIAGNOSIS — R7301 Impaired fasting glucose: Secondary | ICD-10-CM | POA: Insufficient documentation

## 2015-06-27 MED ORDER — ALBUTEROL SULFATE (2.5 MG/3ML) 0.083% IN NEBU: 2.5000 mg | INHALATION_SOLUTION | RESPIRATORY_TRACT | Status: DC | PRN

## 2015-09-25 ENCOUNTER — Ambulatory Visit: Payer: Medicare Other | Admitting: Family Medicine

## 2015-09-27 ENCOUNTER — Ambulatory Visit (INDEPENDENT_AMBULATORY_CARE_PROVIDER_SITE_OTHER): Payer: Medicare Other | Admitting: Family Medicine

## 2015-09-27 ENCOUNTER — Encounter: Payer: Self-pay | Admitting: Family Medicine

## 2015-09-27 VITALS — BP 129/63 | HR 88 | Wt 263.0 lb

## 2015-09-27 DIAGNOSIS — R7301 Impaired fasting glucose: Secondary | ICD-10-CM

## 2015-09-27 DIAGNOSIS — Z23 Encounter for immunization: Secondary | ICD-10-CM

## 2015-09-27 DIAGNOSIS — J45901 Unspecified asthma with (acute) exacerbation: Secondary | ICD-10-CM | POA: Diagnosis not present

## 2015-09-27 MED ORDER — FUROSEMIDE 40 MG PO TABS: 40.0000 mg | ORAL_TABLET | Freq: Every day | ORAL | 1 refills | Status: DC

## 2015-09-27 MED ORDER — FLUTICASONE-SALMETEROL 250-50 MCG/DOSE IN AEPB: 1.0000 | INHALATION_SPRAY | Freq: Two times a day (BID) | RESPIRATORY_TRACT | 2 refills | Status: DC

## 2015-09-27 NOTE — Progress Notes (Signed)
Subjective:    CC: Asthma   HPI:  Asthma- doing well on Advair 250. Using her albuterol still about once a week.    IFG - new dx. She has lost 14 lbs.    We just checked an A1c on her back in June. That was the first one we had done. It came back mildly elevated in the prediabetic range. She says she is Artie started to change her diet and cut out a lot of sugar. Her grandson was also recently diagnosed with diabetes. Lab Results  Component Value Date   HGBA1C 5.8 (H) 06/25/2015    Past medical history, Surgical history, Family history not pertinant except as noted below, Social history, Allergies, and medications have been entered into the medical record, reviewed, and corrections made.   Review of Systems: No fevers, chills, night sweats, weight loss, chest pain, or shortness of breath.   Objective:    General: Well Developed, well nourished, and in no acute distress.  Neuro: Alert and oriented x3, extra-ocular muscles intact, sensation grossly intact.  HEENT: Normocephalic, atraumatic  Skin: Warm and dry, no rashes. Cardiac: Regular rate and rhythm, no murmurs rubs or gallops, no lower extremity edema.  Respiratory: Clear to auscultation bilaterally. Not using accessory muscles, speaking in full sentences.   Impression and Recommendations:    Asthma, mild persistent-we'll continue with Advair 250 through the winter's and she says that her most difficult time of the year. After the winter we will see if we can decrease her Advair back down. Call if any problems. Flu shot given today. Next  Impaired fasting glucose-Hemoccult A1c of 5.8. Discussed diet and regular exercise and recheck in 6 months. She has done a great job and has already lost 14 lbs.

## 2015-11-26 ENCOUNTER — Ambulatory Visit (INDEPENDENT_AMBULATORY_CARE_PROVIDER_SITE_OTHER): Payer: Medicare Other | Admitting: Osteopathic Medicine

## 2015-11-26 ENCOUNTER — Encounter: Payer: Self-pay | Admitting: Osteopathic Medicine

## 2015-11-26 VITALS — BP 147/95 | HR 90 | Temp 98.6°F | Wt 274.0 lb

## 2015-11-26 DIAGNOSIS — J069 Acute upper respiratory infection, unspecified: Secondary | ICD-10-CM

## 2015-11-26 DIAGNOSIS — B9789 Other viral agents as the cause of diseases classified elsewhere: Secondary | ICD-10-CM

## 2015-11-26 DIAGNOSIS — J45901 Unspecified asthma with (acute) exacerbation: Secondary | ICD-10-CM

## 2015-11-26 MED ORDER — PREDNISONE 20 MG PO TABS: 20.0000 mg | ORAL_TABLET | Freq: Two times a day (BID) | ORAL | 0 refills | Status: DC

## 2015-11-26 MED ORDER — IPRATROPIUM BROMIDE 0.03 % NA SOLN: 2.0000 | Freq: Three times a day (TID) | NASAL | 0 refills | Status: DC | PRN

## 2015-11-26 MED ORDER — GUAIFENESIN-CODEINE 100-10 MG/5ML PO SYRP: 5.0000 mL | ORAL_SOLUTION | Freq: Three times a day (TID) | ORAL | 0 refills | Status: DC | PRN

## 2015-11-26 MED ORDER — AZITHROMYCIN 250 MG PO TABS: ORAL_TABLET | ORAL | 0 refills | Status: DC

## 2015-11-26 NOTE — Progress Notes (Signed)
HPI: Briana Silva is a 70 y.o. female  who presents to [facility] [place] today, 11/26/15,  for chief complaint of:  Chief Complaint  Patient presents with  . URI     . Context: +sick contacts, grandchild . Location: Sinus congestion/mucous, mostly dry but occasionally productive cough. . Duration: 1 weeks ago  . Modifying factors: OTC meds not helping, Zyrtec, nebulizer used about 3 times  . Assoc signs/symptoms: No fever now. +wheezing on occasion    Past medical, surgical, social and family history reviewed: Patient Active Problem List   Diagnosis Date Noted  . IFG (impaired fasting glucose) 06/27/2015  . 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 Osteopenia 06/23/2011  . OSA (obstructive sleep apnea) 08/08/2010  . EXTRINSIC ASTHMA, WITH EXACERBATION 12/04/2009  . Hyperlipidemia 07/10/2009  . DEPRESSION, MILD 07/10/2009  . Obesity 12/15/2008  . OTHER SPECIFIED FORMS OF HEARING LOSS 12/15/2008  . Congestive heart failure (HCC) 12/15/2008  . ALLERGIC RHINITIS CAUSE UNSPECIFIED 12/15/2008  . POSTMENOPAUSAL STATUS 12/15/2008  . Asthma, extrinsic 10/03/2008   Past Surgical History:  Procedure Laterality Date  . LEFT OOPHORECTOMY  1967  . TUBAL LIGATION  1967   Social History  Substance Use Topics  . Smoking status: Never Smoker  . Smokeless tobacco: Not on file  . Alcohol use No   Family History  Problem Relation Age of Onset  . Pancreatic cancer Father   . Hypertension Mother   . Hypertension Father   . Diabetes Brother   . Cancer Daughter [id number]    Brain tumor  . Alzheimer's disease Mother   . Non-Hodgkin's lymphoma Brother   . Leukemia Brother   . Other Sister     brain tumor     Current medication list and allergy/intolerance information reviewed:   Current Outpatient Prescriptions on File Prior to Visit  Medication Sig Dispense Refill  . albuterol (PROAIR HFA) 108 (90 Base) MCG/ACT inhaler Inhale 2 puffs into the lungs every 4  (four) hours as needed for wheezing or shortness of breath. 6.7 g 5  . albuterol (PROVENTIL) (2.5 MG/3ML) 0.083% nebulizer solution Take 3 mLs (2.5 mg total) by nebulization every 4 (four) hours as needed for wheezing or shortness of breath. Dx:J45.41 75 mL 6  . cetirizine (ZYRTEC) 10 MG tablet Take 1 tablet (10 mg total) by mouth daily as needed for allergies or rhinitis. 30 tablet 5  . fluticasone (FLONASE) 50 MCG/ACT nasal spray Place 2 sprays into both nostrils daily. 16 g 6  . Fluticasone-Salmeterol (ADVAIR DISKUS) 250-50 MCG/DOSE AEPB Inhale 1 puff into the lungs 2 (two) times daily. 60 each 2  . furosemide (LASIX) 40 MG tablet Take 1 tablet (40 mg total) by mouth daily. 90 tablet 1  . metoprolol succinate (TOPROL XL) 25 MG 24 hr tablet Take 1 tablet (25 mg total) by mouth daily. 90 tablet 3  . Omega-3 Fatty Acids (FISH OIL) 1000 MG CAPS Take 1 capsule by mouth daily.     No current facility-administered medications on file prior to visit.    Allergies  Allergen Reactions  . Carvedilol Other (See Comments)    Chest heaviness, felt like limbs were heavy as well.       Review of Systems:  Constitutional: No recent illness  HEENT: +headache, no vision change, +sinus congestion as per HPI  Cardiac: No  chest pain, No  pressure, No palpitations  Respiratory:  No  shortness of breath. +Cough  Gastrointestinal: No  abdominal pain,  no change on bowel habits  Musculoskeletal: No new myalgia/arthralgia  Skin: No  Rash  Exam:  BP (!) 147/95   Pulse 90   Temp 98.6 F (37 C) (Oral)   Wt 274 lb (124.3 kg)   SpO2 100%   BMI 42.91 kg/m   Constitutional: VS see above. General Appearance: alert, well-developed, well-nourished, NAD  Eyes: Normal lids and conjunctive, non-icteric sclera  Ears, Nose, Mouth, Throat: MMM, Normal external inspection ears/nares/mouth/lips/gums. TM normal bilaterally. No tonsillar/pharyngeal erythema/exudate, no cervical lymphadenopathy  Neck: No  masses, trachea midline.   Respiratory: Normal respiratory effort. + Bilateral scattered wheeze, no rhonchi, no rales  Cardiovascular: S1/S2 normal, no murmur, no rub/gallop auscultated. RRR.   Musculoskeletal: Gait normal. Symmetric and independent movement of all extremities  Neurological: Normal balance/coordination. No tremor.  Skin: warm, dry, intact.   Psychiatric: Normal judgment/insight. Normal mood and affect. Oriented x3.     ASSESSMENT/PLAN: Symptomatically treatment and management of asthma exacerbation, patient advised to be using the albuterol several times per day in addition to maintenance inhaler, steroids as below, symptomatically treatment with nasal spray and cough medicine. If cough persists/worsens, let us know and go ahead and fill the azithromycin by think this is a viral respiratory infection with asthma exacerbation that should not require antibiotics. Patient to keep in touch with us and let us know how she is doing.  Asthma, extrinsic, unspecified asthma severity, with acute exacerbation - Plan: ipratropium (ATROVENT) 0.03 % nasal spray, guaiFENesin-codeine (ROBITUSSIN AC) 100-10 MG/5ML syrup, predniSONE (DELTASONE) 20 MG tablet, azithromycin (ZITHROMAX) 250 MG tablet  Viral URI with cough     Visit summary with medication list and pertinent instructions was printed for patient to review. All questions at time of visit were answered - patient instructed to contact office with any additional concerns. ER/RTC precautions were reviewed with the patient. Follow-up plan: Return if symptoms worsen or fail to improve, and as directed by PCP for routine care .

## 2016-02-04 NOTE — Progress Notes (Deleted)
HPI: FU CHF. Patient apparently had congestive heart failure in [place] approximately 8 years ago. She was told her heart was functioning at "30%". She was treated with medications with improvement. She moved to this area and was seen by a cardiologist in [place]. She had a stress test that apparently was negative but records are not available. Echocardiogram January 2017 showed normal LV systolic function, mild left ventricular hypertrophy and biatrial enlargement. Since last seen   Current Outpatient Prescriptions  Medication Sig Dispense Refill  . albuterol (PROAIR HFA) 108 (90 Base) MCG/ACT inhaler Inhale 2 puffs into the lungs every 4 (four) hours as needed for wheezing or shortness of breath. 6.7 g 5  . albuterol (PROVENTIL) (2.5 MG/3ML) 0.083% nebulizer solution Take 3 mLs (2.5 mg total) by nebulization every 4 (four) hours as needed for wheezing or shortness of breath. Dx:J45.41 75 mL 6  . azithromycin (ZITHROMAX) 250 MG tablet 2 tabs po on Day 1, then 1 tab daily Days 2 - 5. Fill Rx if no better 7-10 days after onset of illness 6 tablet 0  . cetirizine (ZYRTEC) 10 MG tablet Take 1 tablet (10 mg total) by mouth daily as needed for allergies or rhinitis. 30 tablet 5  . fluticasone (FLONASE) 50 MCG/ACT nasal spray Place 2 sprays into both nostrils daily. 16 g 6  . Fluticasone-Salmeterol (ADVAIR DISKUS) 250-50 MCG/DOSE AEPB Inhale 1 puff into the lungs 2 (two) times daily. 60 each 2  . furosemide (LASIX) 40 MG tablet Take 1 tablet (40 mg total) by mouth daily. 90 tablet 1  . guaiFENesin-codeine (ROBITUSSIN AC) 100-10 MG/5ML syrup Take 5-10 mLs by mouth 3 (three) times daily as needed for cough. 118 mL 0  . ipratropium (ATROVENT) 0.03 % nasal spray Place 2 sprays into both nostrils 3 (three) times daily as needed for rhinitis (nasal congestion). 30 mL 0  . metoprolol succinate (TOPROL XL) 25 MG 24 hr tablet Take 1 tablet (25 mg total) by mouth daily. 90 tablet 3  . Omega-3  Fatty Acids (FISH OIL) 1000 MG CAPS Take 1 capsule by mouth daily.    . predniSONE (DELTASONE) 20 MG tablet Take 1 tablet (20 mg total) by mouth 2 (two) times daily with a meal. 10 tablet 0   No current facility-administered medications for this visit.      Past Medical History:  Diagnosis Date  . Allergy   . Asthma   . CHF (congestive heart failure) (HCC)   . Depression   . Hyperlipidemia   . Hypertension   . Obesity     Past Surgical History:  Procedure Laterality Date  . LEFT OOPHORECTOMY  1967  . TUBAL LIGATION  1967    Social History   Social History  . Marital status: Widowed    Spouse name: N/A  . Number of children: 2  . Years of education: N/A   Occupational History  . Retired.     Social History Main Topics  . Smoking status: Never Smoker  . Smokeless tobacco: Not on file  . Alcohol use No  . Drug use: No  . Sexual activity: Not on file   Other Topics Concern  . Not on file   Social History Narrative   No regular exercise.  Going to a nutrition center.     Family History  Problem Relation Age of Onset  . Pancreatic cancer Father   . Hypertension Mother   . Hypertension Father   . Diabetes  Brother   . Cancer Daughter 14    Brain tumor  . Alzheimer's disease Mother   . Non-Hodgkin's lymphoma Brother   . Leukemia Brother   . Other Sister     brain tumor    ROS: no fevers or chills, productive cough, hemoptysis, dysphasia, odynophagia, melena, hematochezia, dysuria, hematuria, rash, seizure activity, orthopnea, PND, pedal edema, claudication. Remaining systems are negative.  Physical Exam: Well-developed well-nourished in no acute distress.  Skin is warm and dry.  HEENT is normal.  Neck is supple.  Chest is clear to auscultation with normal expansion.  Cardiovascular exam is regular rate and rhythm.  Abdominal exam nontender or distended. No masses palpated. Extremities show no edema. neuro grossly intact  ECG

## 2016-02-13 ENCOUNTER — Ambulatory Visit: Payer: Medicare Other | Admitting: Cardiology

## 2016-03-26 ENCOUNTER — Ambulatory Visit: Payer: Medicare Other | Admitting: Family Medicine

## 2016-03-27 ENCOUNTER — Encounter: Payer: Self-pay | Admitting: Family Medicine

## 2016-03-27 ENCOUNTER — Ambulatory Visit (INDEPENDENT_AMBULATORY_CARE_PROVIDER_SITE_OTHER): Payer: Medicare Other | Admitting: Family Medicine

## 2016-03-27 VITALS — BP 124/86 | HR 100 | Ht 67.0 in | Wt 267.0 lb

## 2016-03-27 DIAGNOSIS — R7301 Impaired fasting glucose: Secondary | ICD-10-CM

## 2016-03-27 DIAGNOSIS — J45901 Unspecified asthma with (acute) exacerbation: Secondary | ICD-10-CM

## 2016-03-27 DIAGNOSIS — J4530 Mild persistent asthma, uncomplicated: Secondary | ICD-10-CM

## 2016-03-27 LAB — POCT GLYCOSYLATED HEMOGLOBIN (HGB A1C): HEMOGLOBIN A1C: 5.6

## 2016-03-27 MED ORDER — FUROSEMIDE 40 MG PO TABS: 40.0000 mg | ORAL_TABLET | Freq: Every day | ORAL | 1 refills | Status: DC

## 2016-03-27 MED ORDER — FLUTICASONE-SALMETEROL 250-50 MCG/DOSE IN AEPB: 1.0000 | INHALATION_SPRAY | Freq: Two times a day (BID) | RESPIRATORY_TRACT | 2 refills | Status: DC

## 2016-03-27 NOTE — Progress Notes (Signed)
Subjective:    CC: Asthma  HPI:  Impaired fasting glucose-no increased thirst or urination. No symptoms consistent with hypoglycemia.    Asthma - Doing well overall. She's not had any recent exacerbations. She is currently on Advair 250/50. Albuterol as needed. Doesn't have more difficulty because of allergies in the spring.  Hyper lipidemia-she's not currently on medication but does take omega-3. Lab Results  Component Value Date   CHOL 180 06/25/2015   HDL 81 06/25/2015   LDLCALC 89 06/25/2015   TRIG 52 06/25/2015   CHOLHDL 2.2 06/25/2015      Past medical history, Surgical history, Family history not pertinant except as noted below, Social history, Allergies, and medications have been entered into the medical record, reviewed, and corrections made.   Review of Systems: No fevers, chills, night sweats, weight loss, chest pain, or shortness of breath.   Objective:    General: Well Developed, well nourished, and in no acute distress.  Neuro: Alert and oriented x3, extra-ocular muscles intact, sensation grossly intact.  HEENT: Normocephalic, atraumatic  Skin: Warm and dry, no rashes. Cardiac: Regular rate and rhythm, no murmurs rubs or gallops, no lower extremity edema.  Respiratory: Clear to auscultation bilaterally. Not using accessory muscles, speaking in full sentences.   Impression and Recommendations:    IFG - Well controlled. In fact her A1c is improved from last time. Great work. Continue current regimen. Follow-up in 6 months. Lab Results  Component Value Date   HGBA1C 5.6 03/27/2016     Asthma - Stable, mild persistent. Continue current regimen. If she continues to do well through the spring then we may be able to start weaning her Advair. Sure continuing to use Flonase and Zyrtec for the spring.  Reminded her to schedule her mammogram.

## 2016-09-17 ENCOUNTER — Ambulatory Visit (INDEPENDENT_AMBULATORY_CARE_PROVIDER_SITE_OTHER): Payer: Medicare Other | Admitting: Physician Assistant

## 2016-09-17 ENCOUNTER — Encounter: Payer: Self-pay | Admitting: Physician Assistant

## 2016-09-17 ENCOUNTER — Ambulatory Visit (INDEPENDENT_AMBULATORY_CARE_PROVIDER_SITE_OTHER): Payer: Medicare Other

## 2016-09-17 ENCOUNTER — Other Ambulatory Visit: Payer: Self-pay | Admitting: *Deleted

## 2016-09-17 VITALS — BP 147/85 | HR 121 | Temp 98.0°F | Wt 272.0 lb

## 2016-09-17 DIAGNOSIS — J4531 Mild persistent asthma with (acute) exacerbation: Secondary | ICD-10-CM | POA: Diagnosis not present

## 2016-09-17 DIAGNOSIS — R000 Tachycardia, unspecified: Secondary | ICD-10-CM

## 2016-09-17 DIAGNOSIS — R931 Abnormal findings on diagnostic imaging of heart and coronary circulation: Secondary | ICD-10-CM

## 2016-09-17 DIAGNOSIS — R062 Wheezing: Secondary | ICD-10-CM | POA: Diagnosis not present

## 2016-09-17 DIAGNOSIS — R609 Edema, unspecified: Secondary | ICD-10-CM | POA: Diagnosis not present

## 2016-09-17 DIAGNOSIS — R0602 Shortness of breath: Secondary | ICD-10-CM | POA: Diagnosis not present

## 2016-09-17 MED ORDER — ALBUTEROL SULFATE (2.5 MG/3ML) 0.083% IN NEBU: 2.5000 mg | INHALATION_SOLUTION | RESPIRATORY_TRACT | 6 refills | Status: DC | PRN

## 2016-09-17 MED ORDER — DEXAMETHASONE SODIUM PHOSPHATE 4 MG/ML IJ SOLN: 10.0000 mg | Freq: Once | INTRAMUSCULAR | Status: DC

## 2016-09-17 MED ORDER — DEXAMETHASONE SODIUM PHOSPHATE 10 MG/ML IJ SOLN
10.0000 mg | Freq: Once | INTRAMUSCULAR | Status: AC
  Administered 2016-09-17: 10 mg via INTRAMUSCULAR

## 2016-09-17 MED ORDER — PREDNISONE 50 MG PO TABS: ORAL_TABLET | ORAL | 0 refills | Status: DC

## 2016-09-17 MED ORDER — ALBUTEROL SULFATE HFA 108 (90 BASE) MCG/ACT IN AERS: 2.0000 | INHALATION_SPRAY | RESPIRATORY_TRACT | 5 refills | Status: DC | PRN

## 2016-09-17 MED ORDER — IPRATROPIUM-ALBUTEROL 0.5-2.5 (3) MG/3ML IN SOLN: 3.0000 mL | Freq: Once | RESPIRATORY_TRACT | Status: DC

## 2016-09-17 MED ORDER — IPRATROPIUM-ALBUTEROL 0.5-2.5 (3) MG/3ML IN SOLN
3.0000 mL | Freq: Once | RESPIRATORY_TRACT | Status: AC
  Administered 2016-09-17: 3 mL via RESPIRATORY_TRACT

## 2016-09-17 MED ORDER — FLUTICASONE-SALMETEROL 250-50 MCG/DOSE IN AEPB: 1.0000 | INHALATION_SPRAY | Freq: Two times a day (BID) | RESPIRATORY_TRACT | 2 refills | Status: DC

## 2016-09-17 MED ORDER — DEXAMETHASONE SODIUM PHOSPHATE 4 MG/ML IJ SOLN: 4.0000 mg | Freq: Once | INTRAMUSCULAR | Status: DC

## 2016-09-17 NOTE — Patient Instructions (Addendum)
Asthma Action Plan, Adult  The actions that you should take to control your asthma are based on the symptoms that you are having. The condition can be divided into 3 zones: the green zone, yellow zone, and red zone. Follow the action steps for the zone that you are in each day. Green zone: when asthma is under control  Signs and symptoms You may not have any symptoms while you are in the green zone. This means that you:  Have no coughing or wheezing, even while you are working or playing.  Sleep through the night.  Are breathing well.  If you use a peak flow meter: The peak flow is above 240 (80% of your personal best or greater). You should take these medicines every day:  Controller medicine and dosage: Advair 1 puff twice a day   Call your health care provider if:  You are using a reliever or rescue medicine more than 2-3 times per week.  Yellow zone: when asthma is getting worse  Signs and symptoms When you are in the yellow zone, you may have symptoms that interfere with exercise, are noticeably worse after exposure to triggers, or are worse at the first sign of a cold (upper respiratory infection). These may include:  Waking from sleep.  Coughing, especially at night or first thing in the morning.  Mild wheezing.  Chest tightness.  If you use a peak flow meter: The peak flow is 150 to 237 (50-79% of your personal best). Add the following medicine to the ones that you use daily:  Reliever or rescue medicine and dosage: Albuterol 2-4 puffs every 1-2 hours or nebulizer treatment every 1-2 hours  Call your health care provider if:  You are using a reliever or rescue medicine more than 2-3 times per week.  You remain in the yellow zone for 24 hours.  Red zone: when asthma is severe  Signs and symptoms You will likely feel distressed and have symptoms at rest that restrict your activity. You are in the red zone if:  Your breathing is hard and quickly.  Your  nose opens wide, your ribs show, and your neck muscles become visible when you breathe in.  Your lips, fingers, or toes are a bluish color.  You have trouble speaking in full sentences.  Your symptoms do not improve within 15-20 minutes after you use your reliever or rescue medicine (bronchodilator).  If you use a peak flow meter: The peak flow is less than 150 (less than 50% of your personal best). Call your local emergency services (911 in the [place] right away or seek help at the emergency department of the nearest hospital. Use your reliever or rescue medicine.  Start a nebulizer treatment or take 2-4 puffs from a metered-dose inhaler with a spacer.  Repeat this action every 15-20 minutes until help arrives.  What are some common asthma triggers? Discuss your asthma triggers with your health care provider. Some common triggers are:  Dander from the skin, hair, or feathers of animals.  Dust mites.  Cockroaches.  Pollen from trees or grass.  Mold.  Cigarette or tobacco smoke.  Air pollutants, such as dust, household cleaners, hair sprays, aerosol sprays, scented candles, paint fumes, strong chemicals, or strong odors.  Cold air or changes in weather. Cold air may cause inflammation. Winds increase molds and pollens in the air.  Strong emotions, such as crying or laughing hard.  Stress.  Certain medicines, such as aspirin or beta blockers.  Sulfites in  foods and drinks, such as dried fruits and wine.  Infections or inflammatory conditions, such as: ? Flu (influenza). ? Upper respiratory tract infection. ? Lower respiratory tract infection (pneumonia or bronchitis). ? Inflammation of the nasal membranes (rhinitis).  Gastroesophageal reflux disease (GERD). GERD is a condition in which stomach acid comes up into the throat (esophagus).  Exercise or strenuous activity.  This information is not intended to replace advice given to you by your health care provider. Make  sure you discuss any questions you have with your health care provider. Document Released: 10/27/2008 Document Revised: 08/27/2015 Document Reviewed: 10/18/2013 Elsevier Interactive Patient Education  2017 [employer].

## 2016-09-17 NOTE — Progress Notes (Signed)
Chest x-ray shows some thickening of the airways. This is consistent with asthma. There is no evidence of pneumonia or fluid in the lungs.

## 2016-09-17 NOTE — Progress Notes (Signed)
HPI:                                                                Briana Silva is a 71 y.o. female who presents to [facility] today for shortness of breath  Patient with PMH asthma, CHF, OSA, HTN reports wheezing, sputum production, shortness of breath, and fatigue x 4 days. Sputum is purulent. Denies hemoptysis or fever. She has been needing to use her rescue inhaler twice a day and nebulizer treatments. Patient reports she ran out of Advair end of May. She was able to get an Advair discus from a nephew that held her over. Reports has been out of her Advair for three weeks.    Past Medical History:  Diagnosis Date  . Allergy   . Asthma   . CHF (congestive heart failure) (HCC)   . Depression   . Hyperlipidemia   . Hypertension   . Obesity    Past Surgical History:  Procedure Laterality Date  . LEFT OOPHORECTOMY  1967  . TUBAL LIGATION  1967   Social History  Substance Use Topics  . Smoking status: Never Smoker  . Smokeless tobacco: Never Used  . Alcohol use No   family history includes Alzheimer's disease in her mother; Cancer (age of onset: 58) in her daughter; Diabetes in her brother; Hypertension in her father and mother; Leukemia in her brother; Non-Hodgkin's lymphoma in her brother; Other in her sister; Pancreatic cancer in her father.  ROS: negative except as noted in the HPI  Medications: Current Outpatient Prescriptions  Medication Sig Dispense Refill  . albuterol (PROAIR HFA) 108 (90 Base) MCG/ACT inhaler Inhale 2 puffs into the lungs every 4 (four) hours as needed for wheezing or shortness of breath. 6.7 g 5  . albuterol (PROVENTIL) (2.5 MG/3ML) 0.083% nebulizer solution Take 3 mLs (2.5 mg total) by nebulization every 4 (four) hours as needed for wheezing or shortness of breath. Dx:J45.41 75 mL 6  . cetirizine (ZYRTEC) 10 MG tablet Take 1 tablet (10 mg total) by mouth daily as needed for allergies or rhinitis.  30 tablet 5  . fluticasone (FLONASE) 50 MCG/ACT nasal spray Place 2 sprays into both nostrils daily. 16 g 6  . Fluticasone-Salmeterol (ADVAIR DISKUS) 250-50 MCG/DOSE AEPB Inhale 1 puff into the lungs 2 (two) times daily. 60 each 2  . furosemide (LASIX) 40 MG tablet Take 1 tablet (40 mg total) by mouth daily. 90 tablet 1  . ipratropium (ATROVENT) 0.03 % nasal spray Place 2 sprays into both nostrils 3 (three) times daily as needed for rhinitis (nasal congestion). 30 mL 0  . Omega-3 Fatty Acids (FISH OIL) 1000 MG CAPS Take 1 capsule by mouth daily.    . predniSONE (DELTASONE) 50 MG tablet One tab PO daily for 5 days. 5 tablet 0   No current facility-administered medications for this visit.    Allergies  Allergen Reactions  . Carvedilol Other (See Comments)    Chest heaviness, felt like limbs were heavy as well.        Objective:  BP (!) 147/85   Pulse (!) 121   Temp 98 F (36.7 C) (Oral)   Wt 272 lb (123.4 kg)   SpO2 95%   BMI  42.60 kg/m  Gen: well-groomed, cooperative, not ill-appearing, no acute distress, obese female HEENT: normal conjunctiva, neck supple, trachea midline, neck veins are not distended Pulm: Normal work of breathing, normal phonation, diffuse inspiratory and expiratory wheezes, no crackles CV: Tachycardic, regular rhythm, s1 and s2 distinct, no murmurs, clicks or rubs  Neuro: alert and oriented x 3, no tremor MSK: extremities atraumatic, normal gait and station, there is 1+ bilateral pitting edema to the ankle Skin: warm, dry, intact; no rashes on exposed skin, no cyanosis  Pre-bronchodilator PEF 225 Post-bronchodilator PEF 250  No results found for this or any previous visit (from the past 72 hour(s)). Dg Chest 2 View  Result Date: 09/17/2016 CLINICAL DATA:  Shortness of breath and wheezing for 3 days. EXAM: CHEST  2 VIEW COMPARISON:  None. FINDINGS: The cardiomediastinal silhouette is normal in size. Atherosclerotic calcification of the aortic arch. Normal  pulmonary vascularity. Mild peribronchial thickening. No focal consolidation, pleural effusion, or pneumothorax. No acute osseous abnormality. IMPRESSION: Mild peribronchial thickening.  No consolidation. Electronically Signed   By: Obie DredgeWilliam T Derry M.D.   On: 09/17/2016 14:56    Echocardiogram 01/24/2015 - Left ventricle: The cavity size was normal. Wall thickness was   increased in a pattern of mild LVH. Systolic function was normal.   The estimated ejection fraction was in the range of 55% to 65%.   Wall motion was normal; there were no regional wall motion   abnormalities. - Left atrium: The atrium was moderately dilated. - Right atrium: The atrium was mildly to moderately dilated.   Assessment and Plan: 71 y.o. female with   1. Mild persistent asthma with acute exacerbation - DuoNeb and Decadron given in office. Inspiratory wheezes resolved. PEF increased 25 points. SpO2 increased to 97% on RA at rest. Patient declined second Albuterol treatment - Steroids in office today and oral burst x 5 days - Advair and Albuterol refills provided - Fluticasone-Salmeterol (ADVAIR DISKUS) 250-50 MCG/DOSE AEPB; Inhale 1 puff into the lungs 2 (two) times daily.  Dispense: 60 each; Refill: 2 - ipratropium-albuterol (DUONEB) 0.5-2.5 (3) MG/3ML nebulizer solution 3 mL; Take 3 mLs by nebulization once. - albuterol (PROVENTIL) (2.5 MG/3ML) 0.083% nebulizer solution; Take 3 mLs (2.5 mg total) by nebulization every 4 (four) hours as needed for wheezing or shortness of breath. Dx:J45.41  Dispense: 75 mL; Refill: 6 - predniSONE (DELTASONE) 50 MG tablet; One tab PO daily for 5 days.  Dispense: 5 tablet; Refill: 0 - DG Chest 2 View - dexamethasone (DECADRON) injection 10 mg; Inject 1 mL (10 mg total) into the muscle once.  2. Wheezing on expiration - Chest X-ray to rule out infiltrate. Asthma exacerbation treatment as above - dexamethasone (DECADRON) injection 10 mg; Inject 1 mL (10 mg total) into the muscle  once.  3. Peripheral edema - reviewed recent Echo report which showed diastolic dysfunction with normal EF - CXR today to rule out pulmonary edema/CHF exacerbation - cont Lasix prn and follow-up with PCP in 1 week  4. Tachycardia with heart rate 121-140 beats per minute - likely secondary to Albuterol treatments. Rhythm is regular. Patient is asymptomatic. Closely monitor   Patient education and anticipatory guidance given Patient agrees with treatment plan Follow-up with PCP in 1 week or sooner as needed if symptoms worsen or fail to improve  Levonne Hubertharley E. Flecia Shutter PA-C

## 2016-09-18 ENCOUNTER — Encounter: Payer: Self-pay | Admitting: Physician Assistant

## 2016-09-18 DIAGNOSIS — R609 Edema, unspecified: Secondary | ICD-10-CM | POA: Insufficient documentation

## 2016-09-18 DIAGNOSIS — R000 Tachycardia, unspecified: Secondary | ICD-10-CM | POA: Insufficient documentation

## 2016-09-18 DIAGNOSIS — R931 Abnormal findings on diagnostic imaging of heart and coronary circulation: Secondary | ICD-10-CM | POA: Insufficient documentation

## 2016-09-18 HISTORY — DX: Abnormal findings on diagnostic imaging of heart and coronary circulation: R93.1

## 2016-09-25 ENCOUNTER — Encounter: Payer: Self-pay | Admitting: Family Medicine

## 2016-09-25 ENCOUNTER — Ambulatory Visit (INDEPENDENT_AMBULATORY_CARE_PROVIDER_SITE_OTHER): Payer: Medicare Other | Admitting: Family Medicine

## 2016-09-25 VITALS — BP 133/69 | HR 84 | Ht 67.0 in | Wt 274.0 lb

## 2016-09-25 DIAGNOSIS — J4540 Moderate persistent asthma, uncomplicated: Secondary | ICD-10-CM

## 2016-09-25 DIAGNOSIS — Z23 Encounter for immunization: Secondary | ICD-10-CM | POA: Diagnosis not present

## 2016-09-25 DIAGNOSIS — R7301 Impaired fasting glucose: Secondary | ICD-10-CM | POA: Diagnosis not present

## 2016-09-25 LAB — POCT GLYCOSYLATED HEMOGLOBIN (HGB A1C): HEMOGLOBIN A1C: 5.6

## 2016-09-25 NOTE — Progress Notes (Signed)
   Subjective:    Patient ID: Briana Silva, female    DOB: 07/22/1945, 71 y.o.   MRN: [id number]  HPI  71 year old female comes in todayto follow-up on recent asthma exacerbation-she is doing well and has recovered from the exacerbation. She completed her prednisone. She is now on Advair twice a day and using it consistently. Last time she used her rescue inhaler was about 3 or 4 days ago.  Impaired fasting glucose-no increased thirst or urination. No symptoms consistent with hypoglycemia.  Review of Systems     Objective:   Physical Exam  Constitutional: She is oriented to person, place, and time. She appears well-developed and well-nourished.  HENT:  Head: Normocephalic and atraumatic.  Cardiovascular: Normal rate, regular rhythm and normal heart sounds.   Pulmonary/Chest: Effort normal and breath sounds normal.  Neurological: She is alert and oriented to person, place, and time.  Skin: Skin is warm and dry.  Psychiatric: She has a normal mood and affect. Her behavior is normal.          Assessment & Plan:  Moderate persistent asthma-continue with Advair daily. Continue with when necessary albuterol use. Follow-up in about 3 months. If she is doing well at that point and we'll wean down to just inhaled corticosteroid.  IFG - A1c looks good today. We'll continue to follow. Repeat in 6 months. Lab Results  Component Value Date   HGBA1C 5.6 09/25/2016

## 2016-11-20 ENCOUNTER — Ambulatory Visit: Payer: Medicare Other | Admitting: Family Medicine

## 2016-11-20 DIAGNOSIS — Z0189 Encounter for other specified special examinations: Secondary | ICD-10-CM

## 2016-11-20 NOTE — Progress Notes (Deleted)
   Subjective:    Patient ID: Briana PomfretOdette M Silva, female    DOB: 19-Jan-1945, 71 y.o.   MRN: [id number]  HPI 71 year old female comes in today to follow-up for asthma.  She is currently on Advair.  We were hoping that after couple months on therapy she would be able to wean down.   Review of Systems     Objective:   Physical Exam        Assessment & Plan:

## 2017-03-16 ENCOUNTER — Encounter: Payer: Self-pay | Admitting: Physician Assistant

## 2017-03-16 ENCOUNTER — Ambulatory Visit (INDEPENDENT_AMBULATORY_CARE_PROVIDER_SITE_OTHER): Payer: Medicare Other | Admitting: Physician Assistant

## 2017-03-16 VITALS — BP 161/80 | HR 127 | Temp 98.2°F | Ht 67.0 in | Wt 287.0 lb

## 2017-03-16 DIAGNOSIS — R000 Tachycardia, unspecified: Secondary | ICD-10-CM

## 2017-03-16 DIAGNOSIS — I5032 Chronic diastolic (congestive) heart failure: Secondary | ICD-10-CM

## 2017-03-16 DIAGNOSIS — J4530 Mild persistent asthma, uncomplicated: Secondary | ICD-10-CM

## 2017-03-16 DIAGNOSIS — R7301 Impaired fasting glucose: Secondary | ICD-10-CM

## 2017-03-16 DIAGNOSIS — J4531 Mild persistent asthma with (acute) exacerbation: Secondary | ICD-10-CM | POA: Diagnosis not present

## 2017-03-16 DIAGNOSIS — R05 Cough: Secondary | ICD-10-CM

## 2017-03-16 DIAGNOSIS — R059 Cough, unspecified: Secondary | ICD-10-CM

## 2017-03-16 MED ORDER — IPRATROPIUM-ALBUTEROL 0.5-2.5 (3) MG/3ML IN SOLN: 3.0000 mL | RESPIRATORY_TRACT | 0 refills | Status: DC | PRN

## 2017-03-16 MED ORDER — PREDNISONE 50 MG PO TABS: ORAL_TABLET | ORAL | 0 refills | Status: DC

## 2017-03-16 MED ORDER — FLUTICASONE-SALMETEROL 250-50 MCG/DOSE IN AEPB: 1.0000 | INHALATION_SPRAY | Freq: Two times a day (BID) | RESPIRATORY_TRACT | 0 refills | Status: DC

## 2017-03-16 MED ORDER — METHYLPREDNISOLONE SODIUM SUCC 125 MG IJ SOLR
125.0000 mg | Freq: Once | INTRAMUSCULAR | Status: AC
Start: 2017-03-16 — End: 2017-03-16
  Administered 2017-03-16: 125 mg via INTRAMUSCULAR

## 2017-03-16 MED ORDER — FUROSEMIDE 40 MG PO TABS: 40.0000 mg | ORAL_TABLET | Freq: Every day | ORAL | 1 refills | Status: DC

## 2017-03-16 NOTE — Progress Notes (Addendum)
Subjective:    Patient ID: Briana Silva, female    DOB: [birth year], 72 y.o.   MRN: [id number]  HPI Pt is a 72 yo morbidly obese female with CHF, asthma who presents to the clinic with cough. She does take lasix but not daily. Denies any recent swelling or orthopnea. She has not had a fever, chills, body aches. She c/o chest tightness, wheezing and SOB. She does have some increasing fatigue. No hemoptysis. Sputum is more clear in nature. She is using her rescue inhaler with benefit at least 2-3 times a day. She continues to use advair daily. She has some nasal congestion but no sinus pressure, ear pain or ST.   .. Active Ambulatory Problems    Diagnosis Date Noted  . Hyperlipidemia 07/10/2009  . Obesity 12/15/2008  . DEPRESSION, MILD 07/10/2009  . OTHER SPECIFIED FORMS OF HEARING LOSS 12/15/2008  . Congestive heart failure (HCC) 12/15/2008  . ALLERGIC RHINITIS CAUSE UNSPECIFIED 12/15/2008  . EXTRINSIC ASTHMA, WITH EXACERBATION 12/04/2009  . Asthma, extrinsic 10/03/2008  . POSTMENOPAUSAL STATUS 12/15/2008  . OSA (obstructive sleep apnea) 08/08/2010  . Osteopenia 06/23/2011  . 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 IFG (impaired fasting glucose) 06/27/2015  . Mild persistent asthma with acute exacerbation 09/17/2016  . Peripheral edema 09/18/2016  . Tachycardia with heart rate 121-140 beats per minute 09/18/2016  . Abnormal echocardiogram 09/18/2016  . Mild persistent asthma without complication 03/17/2017   Resolved Ambulatory Problems    Diagnosis Date Noted  . Essential hypertension 10/03/2008   Past Medical History:  Diagnosis Date  . Abnormal echocardiogram 09/18/2016  . Allergy   . Asthma   . CHF (congestive heart failure) (HCC)   . Depression   . Hyperlipidemia   . Hypertension   . Obesity      Review of Systems  All other systems reviewed and are negative.  See HPI.      Objective:   Physical Exam  Constitutional: She is oriented to person, place, and  time. She appears well-developed and well-nourished.  Obese.   HENT:  Head: Normocephalic and atraumatic.  Right Ear: External ear normal.  Left Ear: External ear normal.  Nose: Nose normal.  Mouth/Throat: Oropharynx is clear and moist. No oropharyngeal exudate.  TM"s clear bilaterally.   Eyes: Conjunctivae are normal. Right eye exhibits no discharge. Left eye exhibits no discharge.  Neck: Normal range of motion. Neck supple.  Cardiovascular: Regular rhythm and normal heart sounds.  Tachycardia.   Pulmonary/Chest: Effort normal.  Bilateral expiratory wheezing diffusely throughout both lungs. No rhonchi or crackles.   Lymphadenopathy:    She has no cervical adenopathy.  Neurological: She is alert and oriented to person, place, and time.  Skin: Skin is dry. No rash noted.  Psychiatric: She has a normal mood and affect. Her behavior is normal.          Assessment & Plan:  .Marland KitchenNirvi was seen today for cough and sore throat.  Diagnoses and all orders for this visit:  Mild persistent asthma with acute exacerbation -     Fluticasone-Salmeterol (ADVAIR DISKUS) 250-50 MCG/DOSE AEPB; Inhale 1 puff into the lungs 2 (two) times daily. -     methylPREDNISolone sodium succinate (SOLU-MEDROL) 125 mg/2 mL injection 125 mg  Chronic diastolic congestive heart failure (HCC)  Cough -     DG Chest 2 View -     predniSONE (DELTASONE) 50 MG tablet; Take one tablet for 5 days. -     ipratropium-albuterol (  DUONEB) 0.5-2.5 (3) MG/3ML SOLN; Take 3 mLs by nebulization every 2 (two) hours as needed. -     methylPREDNISolone sodium succinate (SOLU-MEDROL) 125 mg/2 mL injection 125 mg  IFG (impaired fasting glucose) -     furosemide (LASIX) 40 MG tablet; Take 1 tablet (40 mg total) by mouth daily.  Mild persistent asthma without complication -     predniSONE (DELTASONE) 50 MG tablet; Take one tablet for 5 days. -     ipratropium-albuterol (DUONEB) 0.5-2.5 (3) MG/3ML SOLN; Take 3 mLs by nebulization  every 2 (two) hours as needed. -     furosemide (LASIX) 40 MG tablet; Take 1 tablet (40 mg total) by mouth daily. -     methylPREDNISolone sodium succinate (SOLU-MEDROL) 125 mg/2 mL injection 125 mg   No signs of CHF exacerbation.  Symptoms consistent with asthma exacerbation.  Treated with prednisone, added duoneb and shot of solumedrol IM in office today.  Follow up if fever, symptoms worsening etc.  Discussed CXR but she has had numerous over the past year. She has no signs of infection today if worsening consider getting CXR.  Pt was tachycardic today but that has been her hx during asthma exacerbation and then regulates on follow up. Follow up with PCP in next 2 weeks may need to discuss adding singular or switching inhalers for better asthma control.

## 2017-03-16 NOTE — Patient Instructions (Signed)

## 2017-03-17 ENCOUNTER — Ambulatory Visit: Payer: Medicare Other | Admitting: Physician Assistant

## 2017-03-17 DIAGNOSIS — J4530 Mild persistent asthma, uncomplicated: Secondary | ICD-10-CM | POA: Insufficient documentation

## 2017-03-17 DIAGNOSIS — J4540 Moderate persistent asthma, uncomplicated: Secondary | ICD-10-CM | POA: Insufficient documentation

## 2017-03-23 DIAGNOSIS — J206 Acute bronchitis due to rhinovirus: Secondary | ICD-10-CM | POA: Diagnosis not present

## 2017-03-23 DIAGNOSIS — G4733 Obstructive sleep apnea (adult) (pediatric): Secondary | ICD-10-CM | POA: Diagnosis not present

## 2017-03-23 DIAGNOSIS — J989 Respiratory disorder, unspecified: Secondary | ICD-10-CM | POA: Diagnosis not present

## 2017-03-23 DIAGNOSIS — I081 Rheumatic disorders of both mitral and tricuspid valves: Secondary | ICD-10-CM | POA: Diagnosis not present

## 2017-03-23 DIAGNOSIS — I5032 Chronic diastolic (congestive) heart failure: Secondary | ICD-10-CM | POA: Diagnosis not present

## 2017-03-23 DIAGNOSIS — J90 Pleural effusion, not elsewhere classified: Secondary | ICD-10-CM | POA: Diagnosis not present

## 2017-03-23 DIAGNOSIS — J810 Acute pulmonary edema: Secondary | ICD-10-CM | POA: Diagnosis not present

## 2017-03-23 DIAGNOSIS — I5033 Acute on chronic diastolic (congestive) heart failure: Secondary | ICD-10-CM | POA: Diagnosis not present

## 2017-03-23 DIAGNOSIS — J4531 Mild persistent asthma with (acute) exacerbation: Secondary | ICD-10-CM | POA: Diagnosis not present

## 2017-03-23 DIAGNOSIS — I5189 Other ill-defined heart diseases: Secondary | ICD-10-CM | POA: Diagnosis not present

## 2017-03-23 DIAGNOSIS — I5021 Acute systolic (congestive) heart failure: Secondary | ICD-10-CM | POA: Diagnosis not present

## 2017-03-23 DIAGNOSIS — Z79899 Other long term (current) drug therapy: Secondary | ICD-10-CM | POA: Diagnosis not present

## 2017-03-23 DIAGNOSIS — R918 Other nonspecific abnormal finding of lung field: Secondary | ICD-10-CM | POA: Diagnosis not present

## 2017-03-23 DIAGNOSIS — I517 Cardiomegaly: Secondary | ICD-10-CM | POA: Diagnosis not present

## 2017-03-23 DIAGNOSIS — I272 Pulmonary hypertension, unspecified: Secondary | ICD-10-CM | POA: Diagnosis not present

## 2017-03-23 DIAGNOSIS — I10 Essential (primary) hypertension: Secondary | ICD-10-CM | POA: Diagnosis not present

## 2017-03-23 DIAGNOSIS — I110 Hypertensive heart disease with heart failure: Secondary | ICD-10-CM | POA: Diagnosis not present

## 2017-03-23 DIAGNOSIS — R0602 Shortness of breath: Secondary | ICD-10-CM | POA: Diagnosis not present

## 2017-03-23 DIAGNOSIS — J811 Chronic pulmonary edema: Secondary | ICD-10-CM | POA: Diagnosis not present

## 2017-04-02 DIAGNOSIS — I10 Essential (primary) hypertension: Secondary | ICD-10-CM | POA: Diagnosis not present

## 2017-04-02 DIAGNOSIS — Z09 Encounter for follow-up examination after completed treatment for conditions other than malignant neoplasm: Secondary | ICD-10-CM | POA: Diagnosis not present

## 2017-05-27 ENCOUNTER — Telehealth: Payer: Self-pay | Admitting: *Deleted

## 2017-05-27 ENCOUNTER — Other Ambulatory Visit: Payer: Self-pay | Admitting: *Deleted

## 2017-05-27 NOTE — Telephone Encounter (Signed)
lvm informing pt that a refill request for Lisinopril 5 mg was sent from [facility] was faxed today and the last time that this medication was refilled was in 2013. And she has not been taking this since then. Asked that she call back to clarify if this was something that Dr. Jens Som was writing for her now? If so she will need to contact his office for refills.Heath Gold, CMA

## 2017-09-24 DIAGNOSIS — R062 Wheezing: Secondary | ICD-10-CM | POA: Diagnosis not present

## 2017-11-13 ENCOUNTER — Encounter: Payer: Self-pay | Admitting: Gastroenterology

## 2017-12-01 ENCOUNTER — Ambulatory Visit: Payer: Medicare (Managed Care) | Attending: Primary Care | Admitting: Primary Care

## 2017-12-01 VITALS — BP 128/92 | HR 128 | Temp 96.9°F | Ht 66.0 in | Wt 276.0 lb

## 2017-12-01 DIAGNOSIS — G4733 Obstructive sleep apnea (adult) (pediatric): Secondary | ICD-10-CM | POA: Diagnosis not present

## 2017-12-01 DIAGNOSIS — J4541 Moderate persistent asthma with (acute) exacerbation: Secondary | ICD-10-CM | POA: Diagnosis not present

## 2017-12-01 DIAGNOSIS — Z8679 Personal history of other diseases of the circulatory system: Secondary | ICD-10-CM | POA: Insufficient documentation

## 2017-12-01 DIAGNOSIS — Z23 Encounter for immunization: Secondary | ICD-10-CM | POA: Diagnosis not present

## 2017-12-01 DIAGNOSIS — R000 Tachycardia, unspecified: Secondary | ICD-10-CM | POA: Diagnosis not present

## 2017-12-01 DIAGNOSIS — Z86010 Personal history of colonic polyps: Secondary | ICD-10-CM

## 2017-12-01 DIAGNOSIS — Z780 Asymptomatic menopausal state: Secondary | ICD-10-CM

## 2017-12-01 MED ORDER — FUROSEMIDE 40 MG PO TABS *I*
20.0000 mg | ORAL_TABLET | Freq: Every morning | ORAL | 3 refills | Status: DC
Start: 2017-12-01 — End: 2018-03-17

## 2017-12-01 MED ORDER — PREDNISONE 20 MG PO TABS *I*
20.0000 mg | ORAL_TABLET | Freq: Every day | ORAL | 0 refills | Status: AC
Start: 2017-12-01 — End: 2017-12-06

## 2017-12-01 MED ORDER — FLUTICASONE-SALMETEROL 250-50 MCG/ACT IN AEPB *I*
1.0000 | INHALATION_SPRAY | Freq: Two times a day (BID) | RESPIRATORY_TRACT | 3 refills | Status: DC
Start: 2017-12-01 — End: 2019-01-28

## 2017-12-01 MED ORDER — ALBUTEROL SULFATE HFA 108 (90 BASE) MCG/ACT IN AERS *I*
1.0000 | INHALATION_SPRAY | Freq: Four times a day (QID) | RESPIRATORY_TRACT | 5 refills | Status: DC | PRN
Start: 2017-12-01 — End: 2019-10-18

## 2017-12-01 MED ORDER — ALBUTEROL SULFATE (5 MG/ML) 0.5% NEBS SOLUTION *I*
2.5000 mg | INHALATION_SOLUTION | Freq: Four times a day (QID) | RESPIRATORY_TRACT | 5 refills | Status: DC | PRN
Start: 2017-12-01 — End: 2019-10-18

## 2017-12-01 NOTE — Progress Notes (Signed)
Subjective:     Patient ID: Alicia Mcdaniel is a 72 y.o. female.    Presents to establish care and discuss her chronic medical issues.     Asthma: She has been feeling unwell for the past couple of days. She thinks that her asthma is flared due to the change in weather. She moved from [place]. She has been having shortness of breath, wheezing, productive cough, and sore throat. She has been using her albuterol inhaler and/or nebulizer frequently. She is using Advair BID as well. She last needed a course of steroids for her asthma when she was diagnosed with three strains of pneumonia a year ago. She required a 6 day hospitalization during this time.     Previous diagnosis of CHF: Diagnosis made when she was hospitalized a year ago. She was treated with metoprolol and carvedilol. She did not tolerate metoprolol as she feel that she was being chocked due to tightness around her neck. She weaned herself off of carvedilol. She does not want to claim that she has heart failure. She is due for a cardiovascular work-up now. She takes furosemide for lower extremity swelling.     Diagnosis of OSA: She was prescribed CPAP which she does not use. Furthermore, she has been concerned about risk of infection due to CPAP masks.     She reports that she is up to date with her immunizations with the exception of influenza vaccine. She had shingles about 4 - 5 years ago.     Hx of colonic polyps found on colonoscopy. She has refused to repeat colonoscopy as her heart stopped on several occasions during her colonoscopy.       Patient's medications, allergies, past medical, surgical, social and family histories were reviewed and updated as appropriate.    Review of Systems   Constitutional: Negative for activity change, appetite change, chills, fatigue and fever.   HENT: Positive for congestion, postnasal drip, rhinorrhea, sinus pressure, sinus pain and sore throat. Negative for trouble swallowing.    Eyes: Negative for  visual disturbance.   Respiratory: Positive for cough, chest tightness and shortness of breath.    Cardiovascular: Positive for leg swelling. Negative for chest pain and palpitations.   Gastrointestinal: Negative for abdominal pain, constipation, diarrhea, nausea and vomiting.   Genitourinary: Negative for difficulty urinating.   Musculoskeletal: Negative for arthralgias and myalgias.   Allergic/Immunologic: Positive for environmental allergies.   Neurological: Negative for dizziness, light-headedness, numbness and headaches.   Hematological: Negative for adenopathy.   Psychiatric/Behavioral: Negative for confusion and sleep disturbance.           Objective:   Physical Exam   Constitutional: She is oriented to person, place, and time. She appears well-developed and well-nourished. No distress.   HENT:   Head: Normocephalic and atraumatic.   Right Ear: External ear normal.   Left Ear: External ear normal.   Mouth/Throat: Oropharynx is clear and moist.   Eyes: Conjunctivae and EOM are normal.   Neck: Normal range of motion. Neck supple. No thyromegaly present.   Cardiovascular: Regular rhythm and normal heart sounds. Tachycardia present.   Pulmonary/Chest: She is in respiratory distress. She has wheezes.   Abdominal: Soft. Bowel sounds are normal. She exhibits no distension. There is no tenderness. Musculoskeletal: Normal range of motion.     Lymphadenopathy:     She has no cervical adenopathy.   Neurological: She is alert and oriented to person, place, and time.   Skin: Skin is warm and  dry.   Psychiatric: She has a normal mood and affect. Her behavior is normal. Judgment and thought content normal.   Vitals reviewed.    EKG: Sinus tachycardia, HR ~ 120, normal axis, no acute ST segment changes, no comparison        Assessment:      Ms. Alicia Mcdaniel is a 72 year old female presenting to establish care with an acute asthma exacerbation, tachycardia, and wheezing on exam. EKG with sinus tachycardia and normal oxygen  saturation.        Plan:      1) Asthma exacerbation: Start prednisone 20 mg daily for 5 days. Refilled Advair BID and albuterol inhaler/nebulizer PRN.   2) Tachycardia: Unclear if secondary to underlying asthma exacerbation vs untreated cardiomyopathy. Will further assess with labs pro-BNP and echocardiogram.   3) Hx of CHF: Reassess heart function with echocardiogram, treat as indicated.   4) OSA: Likely needs treatment, will assess for right heart dysfunction on echocardiogram.  5) Hx of colonic polyps: continue to monitor symptoms.     High dose influenza given today.     Follow-up in ~ 3 months or earlier as dictated above.     Delma OfficerKristen Kary Sugrue, MD  12/01/2017

## 2017-12-04 ENCOUNTER — Other Ambulatory Visit
Admission: RE | Admit: 2017-12-04 | Discharge: 2017-12-04 | Disposition: A | Discharge status: Home / Self Care | Payer: Medicare (Managed Care) | Source: Ambulatory Visit | Attending: Primary Care | Admitting: Primary Care

## 2017-12-04 DIAGNOSIS — E559 Vitamin D deficiency, unspecified: Secondary | ICD-10-CM | POA: Insufficient documentation

## 2017-12-04 DIAGNOSIS — G4733 Obstructive sleep apnea (adult) (pediatric): Secondary | ICD-10-CM

## 2017-12-04 DIAGNOSIS — I509 Heart failure, unspecified: Secondary | ICD-10-CM | POA: Insufficient documentation

## 2017-12-04 DIAGNOSIS — Z780 Asymptomatic menopausal state: Secondary | ICD-10-CM | POA: Insufficient documentation

## 2017-12-04 DIAGNOSIS — R000 Tachycardia, unspecified: Secondary | ICD-10-CM

## 2017-12-04 DIAGNOSIS — Z86010 Personal history of colonic polyps: Secondary | ICD-10-CM | POA: Insufficient documentation

## 2017-12-04 DIAGNOSIS — E782 Mixed hyperlipidemia: Secondary | ICD-10-CM | POA: Insufficient documentation

## 2017-12-04 DIAGNOSIS — Z8679 Personal history of other diseases of the circulatory system: Secondary | ICD-10-CM | POA: Insufficient documentation

## 2017-12-04 DIAGNOSIS — R7301 Impaired fasting glucose: Secondary | ICD-10-CM | POA: Insufficient documentation

## 2017-12-04 DIAGNOSIS — J4541 Moderate persistent asthma with (acute) exacerbation: Secondary | ICD-10-CM | POA: Insufficient documentation

## 2017-12-04 LAB — COMPREHENSIVE METABOLIC PANEL
ALT: 30 U/L (ref 0–35)
AST: 21 U/L (ref 0–35)
Albumin: 4.2 g/dL (ref 3.5–5.2)
Alk Phos: 74 U/L (ref 35–105)
Anion Gap: 11 (ref 7–16)
Bilirubin,Total: 0.6 mg/dL (ref 0.0–1.2)
CO2: 29 mmol/L — ABNORMAL HIGH (ref 20–28)
Calcium: 9.2 mg/dL (ref 8.6–10.2)
Chloride: 100 mmol/L (ref 96–108)
Creatinine: 0.8 mg/dL (ref 0.51–0.95)
GFR,Black: 84 *
GFR,Caucasian: 73 *
Glucose: 97 mg/dL (ref 60–99)
Lab: 11 mg/dL (ref 6–20)
Potassium: 4.8 mmol/L (ref 3.3–5.1)
Sodium: 140 mmol/L (ref 133–145)
Total Protein: 7.2 g/dL (ref 6.3–7.7)

## 2017-12-04 LAB — CBC AND DIFFERENTIAL
Baso # K/uL: 0.1 10*3/uL (ref 0.0–0.1)
Basophil %: 1.1 %
Eos # K/uL: 0.2 10*3/uL (ref 0.0–0.4)
Eosinophil %: 2.8 %
Hematocrit: 45 % (ref 34–45)
Hemoglobin: 13.8 g/dL (ref 11.2–15.7)
IMM Granulocytes #: 0 10*3/uL
IMM Granulocytes: 0.3 %
Lymph # K/uL: 2.8 10*3/uL (ref 1.2–3.7)
Lymphocyte %: 39.2 %
MCH: 30 pg/cell (ref 26–32)
MCHC: 31 g/dL — ABNORMAL LOW (ref 32–36)
MCV: 96 fL — ABNORMAL HIGH (ref 79–95)
Mono # K/uL: 0.6 10*3/uL (ref 0.2–0.9)
Monocyte %: 8.5 %
Neut # K/uL: 3.4 10*3/uL (ref 1.6–6.1)
Nucl RBC # K/uL: 0 10*3/uL (ref 0.0–0.0)
Nucl RBC %: 0 /100 WBC (ref 0.0–0.2)
Platelets: 291 10*3/uL (ref 160–370)
RBC: 4.7 MIL/uL (ref 3.9–5.2)
RDW: 13.3 % (ref 11.7–14.4)
Seg Neut %: 48.1 %
WBC: 7.2 10*3/uL (ref 4.0–10.0)

## 2017-12-04 LAB — LIPID PANEL
Chol/HDL Ratio: 2.5
Cholesterol: 198 mg/dL
HDL: 80 mg/dL — ABNORMAL HIGH (ref 40–60)
LDL Calculated: 106 mg/dL
Non HDL Cholesterol: 118 mg/dL
Triglycerides: 60 mg/dL

## 2017-12-04 LAB — TSH: TSH: 1.14 u[IU]/mL (ref 0.27–4.20)

## 2017-12-04 LAB — T4, FREE: Free T4: 1.3 ng/dL (ref 0.9–1.7)

## 2017-12-04 LAB — HEMOGLOBIN A1C: Hemoglobin A1C: 5.7 % — ABNORMAL HIGH

## 2017-12-04 LAB — VITAMIN D: 25-OH Vit Total: 36 ng/mL (ref 30–60)

## 2017-12-04 LAB — NT-PRO BNP: NT-pro BNP: 1619 pg/mL — ABNORMAL HIGH (ref 0–900)

## 2017-12-16 ENCOUNTER — Ambulatory Visit: Payer: Medicare (Managed Care) | Attending: Cardiology | Admitting: Cardiology

## 2017-12-16 ENCOUNTER — Telehealth: Payer: Self-pay | Admitting: Primary Care

## 2017-12-16 ENCOUNTER — Ambulatory Visit
Admission: RE | Admit: 2017-12-16 | Discharge: 2017-12-16 | Disposition: A | Discharge status: Home / Self Care | Payer: Medicare (Managed Care) | Source: Ambulatory Visit | Admitting: Cardiology

## 2017-12-16 ENCOUNTER — Ambulatory Visit
Admission: RE | Admit: 2017-12-16 | Discharge: 2017-12-16 | Disposition: A | Discharge status: Home / Self Care | Payer: Medicare (Managed Care) | Source: Ambulatory Visit | Attending: Cardiology | Admitting: Cardiology

## 2017-12-16 VITALS — BP 122/74 | HR 108 | Ht 66.5 in | Wt 278.0 lb

## 2017-12-16 DIAGNOSIS — R000 Tachycardia, unspecified: Secondary | ICD-10-CM | POA: Diagnosis not present

## 2017-12-16 DIAGNOSIS — Z8679 Personal history of other diseases of the circulatory system: Secondary | ICD-10-CM | POA: Diagnosis not present

## 2017-12-16 DIAGNOSIS — I420 Dilated cardiomyopathy: Secondary | ICD-10-CM | POA: Diagnosis not present

## 2017-12-16 DIAGNOSIS — G4733 Obstructive sleep apnea (adult) (pediatric): Secondary | ICD-10-CM | POA: Diagnosis not present

## 2017-12-16 MED ORDER — SPIRONOLACTONE 25 MG PO TABS *I*
25.0000 mg | ORAL_TABLET | Freq: Every day | ORAL | 3 refills | Status: DC
Start: 2017-12-16 — End: 2019-02-01

## 2017-12-16 NOTE — Telephone Encounter (Signed)
Message noted.     Ellora Varnum, MD  12/16/2017

## 2017-12-16 NOTE — Telephone Encounter (Signed)
Patient was in for tachycardia had an echo and it was abnormal with decreased EF.  Kerry HoughOdette is having a cardiology consultation.  Report to follow.

## 2017-12-16 NOTE — Discharge Instructions (Signed)
This recording of your ECG is a common procedure that requires your cooperation for the best possible results.   Write any symptoms that you feel as you go about your normal daily activities.  For each event, please note any activity you were doing at that time (see examples).     Symptoms May Include:   Chest, arm or neck pain.  Shortness of breath or difficulty breathing.  Skipped beats, palpitations or pounding heart beats.  Dizziness, lightheadedness, nausea or faintness.   Any symptom that caused you to see your doctor.     Activities May Include:   Going to bed and waking up.  Start and stop of exercise (walking, running, cycling).  Time of medication.    IMPORTANT!    If your symptom is severe, seek medical attention.   The recorder cannot help you and will not contact medical personnel.    Keep the recorder dry! Sponge bath if necessary.    Avoid electric blankets and industrial machinery.     "Sample Diary"    Date and Time of Event    Symptom/Activity   9:30am    Dizziness/ Walked to car   11:12am    Chest Pain/ Watching TV            How to remove the monitor:  · Remove the recorder after the appropriate amount of time (24 or 48 hours) by peeling off the adhesive electrodes.  Discard the electrodes.  You do not have to turn off the monitor.    · Place the wires, recorder, carrying pouch and diary in the plastic bag and then in the padded envelope with the green label.    · Please return the recorder as soon as possible after the removal.    Where to return the monitor:    [facility]:  Monday to Friday 7:30 am to 4:30 pm.  Please return the monitor to the reception desk at [facility], [address], [place], [place]    After Hours (4:30 pm to 7:30 am, weekends, and holidays) as below.    [facility]:  Sunday to Saturday 7:00 am to 7:00 pm  Please return the monitor to Patient Discharge.  Parking staff may / or may  not be able to take the monitor, depending on their availability.  The attendant may take the monitor or, if not, you may be directed to a parking spot.  If the attendant is not available, please bring the padded envelope into the monitor drop box across from the [employer], adjacent to Patient Discharge (just past the parking ticket machine) and place it in the designated slot.    After Hours: 7:00 pm to 7:00 am  Please return the monitor to the Information Desk in the Main Lobby of [facility].  Drive into the traffic circle in the front of the hospital ([address] side) and pull to the far right.  Put the flashers on the car and bring the monitor into the Information Desk and hand it to someone behind the desk.

## 2017-12-17 LAB — ECHO COMPLETE
Aortic Diameter (mid tubular): 3 cm
Aortic Diameter (sinus of Valsalva): 3 cm
BMI: 44.6 kg/m2
BSA: 2.41 m2
EF: 40 %
Heart Rate: 111 {beats}/min
Height: 66 in
LA Systolic Vol BSA Index: 30.3 mL/m2
LA Systolic Vol Height Index: 43.5 mL/m
LA Systolic Volume: 73 mL
LV ASE Mass BSA Index: 70.6 gm/m2
LV ASE Mass Height 2.7 Index: 42.2 gm/m2.7
LV ASE Mass Height Index: 101.5 gm/m
LV ASE Mass: 170.2 gm
LV CO BSA Index: 2.16 L/min/m2
LV Cardiac Output: 5.22 L/min
LV Diastolic Volume Index: 49 mL/m2
LV Posterior Wall Thickness: 0.8 cm
LV SV - LVOT SV Diff: 1.66 mL
LV SV BSA Index: 19.5 mL/m2
LV SV Height Index: 28 mL/m
LV Septal Thickness: 0.8 cm
LV Stroke Volume: 47 mL
LV Systolic Volume Index: 29.5 mL/m2
LVED Diameter BSA Index: 2.4 cm/m2
LVED Diameter Height Index: 3.4 cm/m
LVED Diameter: 5.7 cm
LVED Volume BSA Index: 49 mL/m2
LVED Volume BSA Index: 49 ml/m2
LVED Volume Height Index: 70.4 mL/m
LVED Volume: 118 mL
LVEF (Volume): 40 %
LVES Volume BSA Index: 29 ml/m2
LVES Volume BSA Index: 29.5 mL/m2
LVES Volume Height Index: 42.4 mL/m
LVES Volume: 71 mL
LVOT Area (calculated): 2.83 cm2
LVOT Cardiac Index: 2.09 L/min/m2
LVOT Cardiac Output: 5.03 L/min
LVOT Diameter: 1.9 cm
LVOT PWD VTI: 16 cm
LVOT PWD Velocity (mean): 62.9 cm/s
LVOT PWD Velocity (peak): 84.3 cm/s
LVOT SV BSA Index: 18.81 mL/m2
LVOT SV Height Index: 27 mL/m
LVOT Stroke Rate (mean): 178.2 mL/s
LVOT Stroke Rate (peak): 238.9 mL/s
LVOT Stroke Volume: 45.34 cc
MR Regurgitant Fraction (LV SV Mtd): 0.04
MR Regurgitant Volume (LV SV Mtd): 1.7 mL
MV Area (LV SV Mtd): 1.9 cm2
MV Area (LV SV) BSA Index: 0.79 cm2/m2
MV Area (LVOT SV Mtd): 1.83 cm2
MV CWD Gradient (Mean): 2.8 mmHg
MV CWD Gradient (Peak): 12.3 mmHg
MV CWD VTI: 24.8 cm
Mean Velocity MV: 83.44 cm/s
Peak Velocity - MV: 175 cm/s
RA Volume BSA Index: 14.1 mL/m2
RA Volume Height Index: 20.3 mL/m
RA Volume: 34 mL
RR Interval: 540.54 ms
Weight (lbs): 276 [lb_av]
Weight: 4416 oz

## 2017-12-20 ENCOUNTER — Encounter: Payer: Self-pay | Admitting: Cardiology

## 2017-12-20 NOTE — Progress Notes (Signed)
Dear Dr. Delma Officer, MD:    Today, 12/20/2017, I had the pleasure of seeing Alicia Mcdaniel in my cardiology clinic for evaluation.    CHIEF COMPLAINTS:   Chief Complaint   Patient presents with    New Patient Visit     Cardiomyopathy noted on echo today.       HISTORY OF PRESENT ILLNESS: Alicia Mcdaniel is 72 years old and has past medical history significant for asthma, cardiomyopathy / congestive heart failure (diagnosed 2007; never adequately treated) and obstructive sleep apnea. She was sent to our office for a transthoracic echo after blood work revealed an elevated NT proBNP level. Her echo showed moderate global LV systolic dysfunction and I saw her for evaluation after the echo. She complains of mild shortness of breath today but overall, feels at her baseline.    She was seen in the primary care physician's office on 12/01/17 and complained of shortness of breath which, she attributed to asthma. She was given a prednisone taper and also had blood work which included NT proBNP level. Her NT proBNP level was 1619 and at that time, a transthoracic echo was arranged. I reviewed the results of echo done today which, showed LVEF of around 40%. Patient said that she was diagnosed with congestive heart failure in 2007. At that time, she was started on medical therapy for cardiomyopathy but she was not really able to tolerate it. Over time, she stopped all her medications except furosemide. She said that metoprolol gave her choking sensation. Carvedilol was tried and it did not give her any side effects but over time, patient stopped it. She has been living in [place] for some time and has been seeing a physician but has not seen a cardiologist. She does not remember ever having a coronary angiography or left heart catheterization. Patient has continued to take Lasix on a regular basis and has continued to have bouts of shortness of breath which, have been attributed to asthma. It is  possible that these bouts of shortness of breath are due to congestive heart failure exacerbation. Patient has also noted intermittent elevated heart rate which, raises the suspicion of a chronic atrial tachyarrhythmia. Today, she feels well.    CURRENT ALLERGIES:  Allergies   Allergen Reactions    Environmental Allergies Other (See Comments)     Irritates asthma       CURRENT MEDICATIONS:   Current Outpatient Medications   Medication Sig    furosemide (LASIX) 40 MG tablet Take 0.5 tablets (20 mg total) by mouth every morning    fluticasone-salmeterol (ADVAIR/WIXELA) 250-50 MCG/DOSE diskus inhaler Inhale 1 puff into the lungs 2 times daily    spironolactone (ALDACTONE) 25 MG tablet Take 1 tablet (25 mg total) by mouth daily    albuterol HFA (PROVENTIL, VENTOLIN, PROAIR HFA) 108 (90 Base) MCG/ACT inhaler Inhale 1-2 puffs into the lungs every 6 hours as needed for Wheezing or Shortness of Breath Shake well before each use.    albuterol (PROVENTIL) 0.5% (5 mg/mL) nebulizer solution Take 0.5 mLs (2.5 mg total) by nebulization every 6 hours as needed for Wheezing or Shortness of Breath       PAST MEDICAL HISTORY:  Past Medical History:   Diagnosis Date    Asthma     Colonic polyp     Congestive heart failure     OSA (obstructive sleep apnea)        SOCIAL HISTORY: She has a sedentary office job. The patient  reports that she has never smoked. She has never used smokeless tobacco. She reports current alcohol use. She reports previous drug use.    FAMILY HISTORY: family history includes Alzheimer's disease in her mother; Blood clots in her daughter; Clotting disorder in her brother, brother, and daughter; Hypertension in her father and mother; Lung disease in her father; Other in her brother, daughter, and sister; Pancreatic Cancer in her father; Thyroid disease in her daughter.    REVIEW OF SYSTEMS: Neuro: denies headaches, dizziness or lightheadedness. Constitutional: denies fever or chills; denies weight loss  or weight gain; complains of tiredness. Eyes: negative. ENT: negative. Cardiovascular: denies chest pain, orthopnea or paroxysmal nocturnal dyspnea. Respiratory: Complains of shortness of breath which, she attributes to asthma. GI: denies nausea, vomiting or diarrhea. GU: negative. Musculoskeletal: Pain related to arthritis and lower extremity joints. Skin: denies skin rashes. Psychiatric: negative. Endocrine: negative. Hematologic: negative. Allergy / Immunology: negative.    PHYSICAL EXAMINATION:   Recia appears to be an extremely obese woman who is sitting comfortably.   Vitals signs are:   Vitals:    12/16/17 1107   BP: 122/74   Pulse: 108   Weight: 126.1 kg (278 lb)   Height: 1.689 m (5' 6.5")     BMI: Body mass index is 44.2 kg/m.  Eyes: show normal conjunctivae and lids with no major abnormalities  ENT exam: There is normal oral mucosa without pallor or cyanosis.  Neck examination: JVP is difficult to assess due to body habitus but it does not appear to be significantly elevated.   Lung examination: shows normal respiratory effort. Lung fields show decreased air entry at both bases with crackles noted at both bases.   Cardiovascular examination: S1 and S2 are normal. Heart sounds are distant and there appears to be a holosystolic murmur of mitral valve regurgitation. Carotid artery upstroke is normal and no obvious carotid bruits are noted. Femoral artery pulses as well as dorsalis pedis / posterior tibial pulses are normal.  Abdomen: is soft and non-tender. No obvious palpable masses are noted.  Musculoskeletal examination: shows arthritis of back and lower extremity joints. Gait is normal. Muscle strength and tone are normal.   Extremities examination: shows normal digits and nails. There is trace to mild, bilateral lower extremity edema.  Skin and subcutaneous tissue examination: appears to be normal.  Neurological / Psychiatric examination: Grossly nonfocal. Orientation, mood and affect appear to be  normal.    ECG: (12/01/17) probable atrial tachycardia with heart rate of 122 bpm. No significant ST segment or T-wave abnormalities are noted.    Assessment    IMPRESSION AND RECOMMENDATIONS: Sharyon Peitz is 72 years old and has past medical history significant for asthma, cardiomyopathy / congestive heart failure (diagnosed 2007; never adequately treated), episodic tachycardia with rare palpitations and obstructive sleep apnea. She was seen in my office today after an echo done earlier today revealed cardiomyopathy with LVEF of about 40%. Patient has a history of asthma for a number of years and has been treated with inhalers and prednisone tapers. With diagnosis of cardiomyopathy, it is possible that her shortness of breath is all related to congestive heart failure. She feels well today and complains of only mild dyspnea. I reviewed the findings with her in detail.     Following problems were addressed:     1. Cardiomyopathy: Patient has moderate global LV systolic dysfunction. She does not have any definite evidence of increased intravascular volume but needs medical therapy for her chronic  cardiomyopathy. It appears that patient has known about a cardiomyopathy for more than 10 years but it has not been adequately treated. She developed choking sensation from metoprolol but was able to tolerate carvedilol. She is reluctant to start any new medications. I talked to her at length about cardiomyopathy and about medical therapy for cardiomyopathy. We will start of with spironolactone as that is least likely to cause significant blood pressure lowering or tiredness. She will continue furosemide. Over time, we will optimize medical therapy for her cardiomyopathy and add beta blocker and ACE inhibitor as tolerated.    2. Concerns about atrial tachycardia: Patient's ECG on 12/01/17 raises the suspicion of atrial tachycardia. It is possible that this atrial tachycardia is responsible for her cardiomyopathy. I  will arrange a Holter monitor to assess her arrhythmia over a longer period.    3. Hypertension: Blood pressure appears to be under good control.    4. Hyperlipidemia: Fasting lipid profile was reviewed from 12/04/17. It showed total cholesterol 198, triglycerides 60, HDL cholesterol 80, LDL cholesterol 106. She will benefit from statin use. I did not discuss this issue. For now, we will focus on management of cardiomyopathy and discuss treatment of hyperlipidemia at a later date.    Thank you for allowing me to participate in care of this pleasant patient. Please do not hesitate to contact me if you have any questions.  Kathrynn SpeedIMRAN N Zamarian Scarano, MD     Daralene MilchImran Feiga Nadel, MD   [facility]  Pager [phone]. Phone [phone]. Fax [phone].

## 2017-12-25 ENCOUNTER — Telehealth: Payer: Self-pay

## 2017-12-25 NOTE — Telephone Encounter (Signed)
Spoke with Tacy LearnAmy Gregory regarding Holter results.  Frequent atrial ectopic tachycardia.  Preliminary results in e-record.

## 2017-12-30 LAB — HOLTER MONITOR - 48 HOUR
AF Count: 0
Analysis Time: 0:0 {titer}
Analyze Time Length Hours: 47
Analyze Time Length Minutes: 53
Bigeminy: 1
Bradycardia Runs: 0
Couplet: 18
Max Heart Rate: 147
Mean Heart Rate: 104
Min Heart Rate: 70
Pauses: 0
SVE Max Per Hour Time: 19:0 {titer}
SVE Max Per Hour: 253
SVE Percent Beats: 1.08 %
SVE Total Beats: 3244
SVE couplet: 94
SVT Runs: 15
SVT longest run: 15
SVT max rate: 171 {beats}/min
Tachycardia Runs: 0
Total Beats: 299191
Trigeminy: 0
Triplet: 0
VE Max Per Hour Time: 14:0 {titer}
VE Max Per Hour: 118
VE Percent Beats: 1.03 %
VE Total Beats: 3086
VT Runs: 0

## 2018-01-01 ENCOUNTER — Telehealth: Payer: Self-pay | Admitting: Cardiology

## 2018-01-01 DIAGNOSIS — I420 Dilated cardiomyopathy: Secondary | ICD-10-CM

## 2018-01-01 NOTE — Telephone Encounter (Signed)
Spoke with patient regarding holter results - atrial tachycardia average rate 104.  Patient reports she is feeling well with initiation of spironolactone.  Patient said she is willing to trial a low dose beta blocker but not metoprolol.  Pt reports prior response to metoprolol: extreme fatigue, feeling of choking, severe muscle heaviness "like I couldn't move my legs".  Patient said she has tolerated carvedilol in the past and [facility]    Will discuss with Dr. Brendolyn Pattyhaudhary and notify patient who demonstrated good understanding.  Planned to call back with response in a couple weeks or sooner for any concerns

## 2018-01-01 NOTE — Telephone Encounter (Addendum)
-----   Message from Kathrynn SpeedImran N Chaudhary, MD sent at 12/31/2017 10:27 AM EST -----  This patient has atrial tachycardia (abnormal fast rhythm) throughout the duration of Holter monitor tracing. This persistent fast heart rate is probably responsible for her current shortness of breath and weakening in her heart function. She needs to be treated with a beta blocker type medicine to help her heart. In the past, she has refused. Please find out how she is doing with spironolactone that, was started at the time of her last office visit. If she is doing okay with spironolactone and is willing to add a medication to help slow her heart rate down, I will send a prescription for a beta blocker type medicine to her pharmacy and then see her in the office to follow-up

## 2018-01-05 MED ORDER — CARVEDILOL 3.125 MG PO TABS *I*
3.1250 mg | ORAL_TABLET | Freq: Two times a day (BID) | ORAL | 11 refills | Status: DC
Start: 2018-01-05 — End: 2018-01-15

## 2018-01-05 NOTE — Telephone Encounter (Signed)
Spoke to patient and explained to start taking carvedilol 3.25 mg twice daily; start tonight. Explained that medication was sent to her pharmacy. She stated that she believes she has carvedilol 3.125 at home. Also explained to call our office on 01/10/18 to let us know how she is tolerating the carvedilol 3.125 mg and that Dr. Brendolyn Pattyhaudhary may adjust the medication. Patient verbalized understanding and is agreeable with plan. She also has a f/u appt on 01/15/18.

## 2018-01-05 NOTE — Telephone Encounter (Signed)
We will start the patient on carvedilol 3.125 mg twice a day and then increase the dose to 6.25 twice a day within a few days.

## 2018-01-15 ENCOUNTER — Ambulatory Visit: Payer: Medicare (Managed Care) | Attending: Cardiology | Admitting: Cardiology

## 2018-01-15 ENCOUNTER — Encounter: Payer: Self-pay | Admitting: Cardiology

## 2018-01-15 VITALS — BP 132/84 | HR 114 | Ht 66.0 in | Wt 276.0 lb

## 2018-01-15 DIAGNOSIS — G4733 Obstructive sleep apnea (adult) (pediatric): Secondary | ICD-10-CM | POA: Diagnosis not present

## 2018-01-15 DIAGNOSIS — I471 Supraventricular tachycardia: Secondary | ICD-10-CM | POA: Diagnosis not present

## 2018-01-15 DIAGNOSIS — Z8679 Personal history of other diseases of the circulatory system: Secondary | ICD-10-CM | POA: Diagnosis not present

## 2018-01-15 DIAGNOSIS — I420 Dilated cardiomyopathy: Secondary | ICD-10-CM | POA: Diagnosis not present

## 2018-01-15 MED ORDER — CARVEDILOL 6.25 MG PO TABS *I*
6.2500 mg | ORAL_TABLET | Freq: Two times a day (BID) | ORAL | 3 refills | Status: DC
Start: 2018-01-15 — End: 2019-02-01

## 2018-01-15 NOTE — Progress Notes (Signed)
Cardiology Office Revisit Note    Date of Visit: 01/15/2018 Patient: Alicia Mcdaniel   Patients PCP: Delma Officer, MD Patient DOB: March 26, 1945     Subjective/Reason For Visit     I had the pleasure of seeing Alicia Mcdaniel in cardiology followup on 01/15/2018. She was seen in my office for evaluation 12/16/17. At that visit, she had a transthoracic echo which showed cardiomyopathy with LVEF of about 40%. Patient was also found to be in persistent atrial tachycardia which, was later on confirmed by a Holter monitor. After significant discussion, patient agreed to start low-dose carvedilol. She is currently taking carvedilol 3.125 mg twice a day. She also takes torsemide and spironolactone. She is tolerating these medications. With carvedilol use, she has noted some tiredness and a few episodes of dizziness. Overall, she is tolerating it. I discussed this diagnosis of persistent atrial tachycardia with her again in detail. I also told her that medical therapy was important to decrease her heart rate. If she continued to have tachycardia, she would likely develop LV systolic dysfunction overtime.    Past Medical History:   Diagnosis Date    Asthma     Colonic polyp     Congestive heart failure     OSA (obstructive sleep apnea)      Past Surgical History:   Procedure Laterality Date    SALPINGO-OOPHORECTOMY Left     TUBAL LIGATION       Review of Systems   Constitutional: Positive for malaise/fatigue.   Respiratory: Positive for shortness of breath.    All other systems reviewed and are negative.    Medications     Current Outpatient Medications   Medication Sig    carvedilol (COREG) 6.25 MG tablet Take 1 tablet (6.25 mg total) by mouth 2 times daily (with meals)    spironolactone (ALDACTONE) 25 MG tablet Take 1 tablet (25 mg total) by mouth daily    furosemide (LASIX) 40 MG tablet Take 0.5 tablets (20 mg total) by mouth every morning    fluticasone-salmeterol (ADVAIR/WIXELA) 250-50 MCG/DOSE  diskus inhaler Inhale 1 puff into the lungs 2 times daily    albuterol HFA (PROVENTIL, VENTOLIN, PROAIR HFA) 108 (90 Base) MCG/ACT inhaler Inhale 1-2 puffs into the lungs every 6 hours as needed for Wheezing or Shortness of Breath Shake well before each use.    albuterol (PROVENTIL) 0.5% (5 mg/mL) nebulizer solution Take 0.5 mLs (2.5 mg total) by nebulization every 6 hours as needed for Wheezing or Shortness of Breath     Vitals and Physical Exam     Alicia Mcdaniel's  height is 1.676 m (5\' 6" ) and weight is 125.2 kg (276 lb). Her blood pressure is 132/84 and her pulse is 114 (abnormal).  Body mass index is 44.55 kg/m.    Physical Exam  Constitutional:       Appearance: She is obese.   HENT:      Head: Normocephalic and atraumatic.      Nose: Nose normal.      Mouth/Throat:      Mouth: Mucous membranes are moist.   Eyes:      Conjunctiva/sclera: Conjunctivae normal.   Neck:      Musculoskeletal: Neck supple.   Cardiovascular:      Rate and Rhythm: Regular rhythm. Tachycardia present.      Heart sounds: Murmur present.      Comments: Tachycardia is from atrial tachycardia. She has soft holosystolic murmur of mitral valve regurgitation.  Pulmonary:  Effort: Pulmonary effort is normal.      Breath sounds: Normal breath sounds. No wheezing.   Abdominal:      Palpations: Abdomen is soft.   Musculoskeletal: Normal range of motion.   Skin:     General: Skin is warm.   Neurological:      Mental Status: She is alert.       Laboratory Data     Hematology:   Results in Past 730 Days  Result Component Current Result Previous Result   WBC 7.2 (12/04/2017) Not in Time Range   Hemoglobin 13.8 (12/04/2017) Not in Time Range   Hematocrit 45 (12/04/2017) Not in Time Range   Platelets 291 (12/04/2017) Not in Time Range     Chemistry:   Results in Past 730 Days  Result Component Current Result Previous Result   Sodium 140 (12/04/2017) Not in Time Range   Potassium 4.8 (12/04/2017) Not in Time Range   Creatinine 0.80 (12/04/2017) Not in  Time Range   Glucose 97 (12/04/2017) Not in Time Range   Calcium 9.2 (12/04/2017) Not in Time Range   Hemoglobin A1C 5.7 (H) (12/04/2017) Not in Time Range   AST 21 (12/04/2017) Not in Time Range   ALT 30 (12/04/2017) Not in Time Range   TSH 1.14 (12/04/2017) Not in Time Range     Cardiac:   Results in Past 730 Days  Result Component Current Result Previous Result   NT-pro BNP 1,619 (H) (12/04/2017) Not in Time Range     Lipids:   Results in Past 730 Days  Result Component Current Result Previous Result   Cholesterol 198 (12/04/2017) Not in Time Range   HDL 80 (H) (12/04/2017) Not in Time Range   Triglycerides 60 (12/04/2017) Not in Time Range   LDL Calculated 106 (12/04/2017) Not in Time Range   Chol/HDL Ratio 2.5 (12/04/2017) Not in Time Range     Cardiac/Imaging Data & Risk Scores          Echo Complete 12/17/2017    Narrative Limited imaging (poor acoustic windows; unable to give echo contrast).   Mildly dilated LV cavity with mild to moderate global LV systolic   dysfunction. Aortic and mitral valve sclerosis. Normal RV size with mildly   reduced RV systolic function. Unable to accurately assess pulmonary artery   systolic function.            Holter Monitor - 48 hour 12/30/2017    Narrative Holter Summary     Total Beats 299,191 Recording Date 12/16/17   Length Recorded 47 hours 53 minutes Analysis Date 12/25/17     Overall Rates    Maximum HR 147 bpm on 12/18/17 10:13   Mean HR 104 bpm   Minimum HR 70 bpm on 12/17/17 22:04     Ectopy     PVC Beats   PAC Beats    Count 3,086  Count 3,244   Percent 1.03 %  Percent 1.08 %   Max/Hr 118 on 12/16/17 14:0  Max/Hr 253 on 12/16/17 19:0     Pauses 0 Longest  on       Ventricular Arrhythmias Supraventricular Arrhythmias     VT   SVT 15   Longest  on    Longest 15 on Thu 7:34 PM   Max Rate  on    Max Rate 171 bpm on Thu 7:34 PM   Couplet 18  PAC Couplet 94   Triplet 0      R on T  Bigeminy 1      Trigeminy 0        Symptoms/Comments:     --  The rhythm during the 47  Hours and 53 minutes of recording was   basically an Atrial ectopic tachycardia with intermittent episodes,   sometimes very brief, of changes to a Sinus mechanism.  Episodes of an   Atrial ectopic tachycardia frequently began with a premature   Supraventricular beat or paired Supraventricular ectopics and ended with a   premature Supraventricular ectopic.  Rare episodes during Sinus rhythm of   P wave changes and a shift in atrial mechanism (ie:  Fri 7:51 AM, Fri 7:54   AM).    --  Hourly heart rates averaged between 85 to 138 BPM.  The minimum   average rate was 70 BPM.  The maximum average rate, associated with atrial   ectopic rhythm, was 147 BPM (Fri 10:13 AM).  The average rate during the   recording was 104 BPM.  See the hourly counts and heart rate graphs.    --  Due to the abrupt rate changes and frequency of the Atrial ectopic   mechanism, Supraventricular ectopy represents an approximate number.    Episodes of Atrial ectopic rhythm are not included in the counts of   ectopy.  Premature Supraventricular ectopics during Atrial ectopic rhythm   were included in the counts along with some of the beats at the onset of   the episodes and at the end.  Supraventricular ectopics during Sinus   rhythm are included in the counts.    --  There were 3244 Supraventricular ectopics counted during the recording   with 94 episodes of paired Supraventricular ectopics.  Very rare   Supraventricular ectopics demonstrated altered conduction.    --  Fifteen runs of a Supraventricular ectopic tachycardia were counted,   some during episodes of Atrial ectopic tachycardia, lasting 3 to 15 beats   with rates of 117 to 171 BPM.  Few episodes demonstrated isolated 2:1 AV   conduction (ie:  Fri 5:39 AM, Fri 3:33 AM).  The longest run was also the   peak rate.    --  Approximately 3086 multiform Ventricular ectopics with around 64.5 %   of one morphology (ie:  Wed 6:00 PM) with the remainder of multiple   morphologies.  Eighteen  episodes of paired Ventricular ectopics were   observed.  One, 3 beat episode of Ventricular bigeminy.  No VT.    --  Symptom of "tired" noted Wed 12:30 PM to 2:00 PM was associated with   predominantly atrial ectopic tachycardia (see hourly counts).  Symptom of   "slight discomfort breast plate 9-52 min" noted Thu 6:00 AM was associated   with Sinus rhythm in the 70s and 80s alternating with an atrial ectopic   rhythm around 100 to 108 BPM and rare Ventricular ectopics.  Symptom of   "very tired/ sweaty, clammy" noted Thu 4:30 PM was associated with an   Atrial ectopic tachycardia with rates in the 120s and 130s with few brief   episodes of Sinus rhythm and rare Ventricular ectopics.  Symptom of   "tired" noted Thu 5:00 PM with rate of 113 BPM was associated with Atrial   ectopic tachycardia with rates of 113 to 122 BPM and brief Sinus rhythm   with Supraventricular ectopics and rare Ventricular ectopics.  The same   symptom noted Thu 9:00 PM with rate of 82 BPM was associated with Sinus  rhythm with Supraventricular ectopics and rare Ventricular ectopics.    Atrial ectopic tachycardia was noted at 9:08 PM.  Symptom of "occasional   twinge of pain/ pressure (mild) lasting 5 sec to 2 min" noted Fri 5:00 AM   was associated with Atrial ectopic tachycardia at 110 BPM with Sinus   rhythm at 5:02 AM.  No sig st changes.  Rare Supraventricular ectopics and   rare Ventricular ectopics.     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  Other than few   episodes of a Supraventricular ectopic tachycardia with isolated 2:1 AV   conduction, no AV block.  No pauses over 2.0 seconds.  Rare premature   Supraventricular ectopics.  Few brief episodes of Supraventricular ectopic   tachycardia.  Rare Ventricular ectopy.  No VT.  Symptoms noted.  See   symptoms/ comments above for details.    Read by:  Ansini/    For questions regarding this interpretation or for clinical consultation,  please call  [phone].       To view the entire scanned Holter tracings in eRecord please click on the   blue hyperlink below under the Scans on Order section.    EP ATTENDING: I have reviewed the documentation.  I agree with the   findings as documented.     Mindi SlickerJAMES GALLAGHER, MD       Impression and Plan     Rasheema Hanley Seamenshford Wickham is 73 years old and has past medical history significant for asthma, cardiomyopathy / congestive heart failure (diagnosed 2007; never adequately treated), persistent atrial tachycardia noted on 12/30/17 Holter monitor, rare palpitations and obstructive sleep apnea. She is in my office for a follow-up visit. She was seen 12/16/17 after a transthoracic echo revealed moderate LV systolic dysfunction with LVEF 40%. We started her on spironolactone and arranged a Holter monitor. A Holter monitor showed persistent atrial tachycardia with heart rate of nearly 120 bpm throughout the duration, carvedilol was started. Patient was reluctant to start a beta blocker but after some discussion agreed to start it. She is currently taking 3.125 mg twice a day and complains of tiredness.     Following problems were addressed:     1. Cardiomyopathy: I discussed medical therapy of cardiomyopathy with her. I wanted to increase carvedilol dose but she was reluctant. After much discussion, she agreed to increase carvedilol gradually. She will increase the dose to 3.125 mg in the morning and 6.25 mg in the evening for next 5 days and then, increase the dose to 6.25 mg twice a day. A new prescription for 6.25 mg carvedilol twice a day was provided to her. She will continue spironolactone and torsemide. Over time, we hope to introduce an agent for afterload reduction, also.    2. Concerns about atrial tachycardia: I talked to her about atrial tachycardia. She was not convinced that she had atrial tachycardia as she did not feel her heart racing all the time. She is still agreed to take carvedilol regularly. Unless we can  control her tachycardia, it will be very hard to improve her LVEF.    3. Hypertension: Blood pressure appears to be under good control. She can tolerate an increase in beta blocker dose.    4. Hyperlipidemia: Fasting lipid profile was reviewed from 12/04/17. It showed total cholesterol 198, triglycerides 60, HDL cholesterol 80, LDL cholesterol 106. She will benefit from statin use. She does not want to take any new medications. We  will continue discussions about this issue over time.     Kathrynn Speed, MD  Electronically signed on 01/15/2018 at 2:08 PM.

## 2018-01-26 ENCOUNTER — Other Ambulatory Visit
Admission: RE | Admit: 2018-01-26 | Discharge: 2018-01-26 | Disposition: A | Discharge status: Home / Self Care | Payer: Medicare (Managed Care) | Source: Ambulatory Visit | Attending: Cardiology | Admitting: Cardiology

## 2018-01-26 DIAGNOSIS — I420 Dilated cardiomyopathy: Secondary | ICD-10-CM | POA: Diagnosis not present

## 2018-01-26 LAB — BASIC METABOLIC PANEL
Anion Gap: 11 (ref 7–16)
CO2: 31 mmol/L — ABNORMAL HIGH (ref 20–28)
Calcium: 9.8 mg/dL (ref 8.6–10.2)
Chloride: 98 mmol/L (ref 96–108)
Creatinine: 0.86 mg/dL (ref 0.51–0.95)
GFR,Black: 77 *
GFR,Caucasian: 66 *
Glucose: 101 mg/dL — ABNORMAL HIGH (ref 60–99)
Lab: 19 mg/dL (ref 6–20)
Potassium: 4.4 mmol/L (ref 3.3–5.1)
Sodium: 140 mmol/L (ref 133–145)

## 2018-02-03 ENCOUNTER — Telehealth: Payer: Self-pay | Admitting: Cardiology

## 2018-02-03 NOTE — Telephone Encounter (Addendum)
-----   Message from Kathrynn Speed, MD sent at 02/02/2018  5:03 PM EST -----  Please let the patient know that blood work done one week ago shows normal kidney function. This means that current medications have not affected her kidney function in any way and she should continue them. These include carvedilol, spironolactone and furosemide. If she has tolerated increase in carvedilol to 6.25 mg twice a day, I would like her to make another increase. If she is agreeable, I would like to increase her carvedilol to 6.25 mg in the morning and 12.5 mg at nighttime. She should then let me know in about one week how she is feeling with higher dose of carvedilol. If she feels uncomfortable increasing carvedilol dose, she should continue current dose and we will review further changes when we see her in the office.       Spoke with patient who increased carvedilol at last visit and said has been trying to increase activity.  Patient was reluctant to increase dose further at this time, but was agreeable to consider the change over the next few weeks and call back with her decision.  Will notify dr. Brendolyn Patty.  Reinforced f/u appt 03/17/2018

## 2018-02-15 ENCOUNTER — Ambulatory Visit: Payer: Medicare (Managed Care) | Admitting: Primary Care

## 2018-02-19 ENCOUNTER — Ambulatory Visit: Payer: Medicare (Managed Care) | Admitting: Primary Care

## 2018-03-17 ENCOUNTER — Other Ambulatory Visit: Payer: Self-pay | Admitting: Cardiology

## 2018-03-17 ENCOUNTER — Encounter: Payer: Self-pay | Admitting: Cardiology

## 2018-03-17 ENCOUNTER — Ambulatory Visit: Payer: Medicare (Managed Care) | Attending: Cardiology | Admitting: Cardiology

## 2018-03-17 VITALS — BP 126/67 | HR 77 | Ht 66.0 in | Wt 272.0 lb

## 2018-03-17 DIAGNOSIS — I420 Dilated cardiomyopathy: Secondary | ICD-10-CM | POA: Diagnosis not present

## 2018-03-17 DIAGNOSIS — I471 Supraventricular tachycardia: Secondary | ICD-10-CM | POA: Diagnosis not present

## 2018-03-17 DIAGNOSIS — G4733 Obstructive sleep apnea (adult) (pediatric): Secondary | ICD-10-CM | POA: Diagnosis not present

## 2018-03-17 DIAGNOSIS — Z8679 Personal history of other diseases of the circulatory system: Secondary | ICD-10-CM | POA: Diagnosis not present

## 2018-03-17 DIAGNOSIS — R000 Tachycardia, unspecified: Secondary | ICD-10-CM | POA: Diagnosis not present

## 2018-03-17 MED ORDER — FUROSEMIDE 20 MG PO TABS *I*
10.0000 mg | ORAL_TABLET | Freq: Every morning | ORAL | 3 refills | Status: DC
Start: 2018-03-17 — End: 2019-01-28

## 2018-03-17 NOTE — Progress Notes (Signed)
Cardiology Office Revisit Note    Date of Visit: 03/17/2018 Patient: Alicia Mcdaniel   Patients PCP: Delma OfficerWalker, Kristen, MD Patient DOB: [birth year]     Subjective/Reason For Visit     I had the pleasure of seeing Alicia DienesOdette Ashford Mcdaniel in cardiology followup on 03/17/2018. Today, she feels well. He was last seen in my office 01/15/18. At that visit, I discussed diagnosis of atrial tachycardia with her in detail and also explained to her that sustained atrial tachycardia had caused a decline in her LVEF. I wanted to increase her beta blocker therapy but patient resisted that idea. Eventually, after much discussion, she agreed to increase carvedilol and started taking 6.25 mg twice a day. With higher dose of carvedilol, she has noted more energy and less shortness of breath. This suggests that there has been an improvement in her atrial tachycardia and possibly in her LVEF with use of spironolactone and carvedilol. Today, she had a lot of questions and I answered those questions. Even though, she has more energy on some days, she is sluggish on other days. She brought blood pressure values from home and they were 93/49, 100/62, 98/66, 103/65. Her heart rate has been around 70-80 bpm on most occasions but on some occasions, it has been as high as 115 or 120. This means that her atrial arrhythmia is under better control. Due to relatively low blood pressure, we are unable to increase her carvedilol dose. She describes shortness of breath when she tries to walk quickly or climb a flight of stairs. This may be related to her obesity and physically deconditioned state.    Past Medical History:   Diagnosis Date    Asthma     Colonic polyp     Congestive heart failure     OSA (obstructive sleep apnea)      Past Surgical History:   Procedure Laterality Date    SALPINGO-OOPHORECTOMY Left     TUBAL LIGATION       Review of Systems   Constitutional: Positive for malaise/fatigue.   Respiratory: Positive for shortness of  breath.    All other systems reviewed and are negative.    Medications     Current Outpatient Medications   Medication Sig    furosemide (LASIX) 20 MG tablet Take 0.5 tablets (10 mg total) by mouth every morning    carvedilol (COREG) 6.25 MG tablet Take 1 tablet (6.25 mg total) by mouth 2 times daily (with meals)    spironolactone (ALDACTONE) 25 MG tablet Take 1 tablet (25 mg total) by mouth daily    albuterol HFA (PROVENTIL, VENTOLIN, PROAIR HFA) 108 (90 Base) MCG/ACT inhaler Inhale 1-2 puffs into the lungs every 6 hours as needed for Wheezing or Shortness of Breath Shake well before each use.    albuterol (PROVENTIL) 0.5% (5 mg/mL) nebulizer solution Take 0.5 mLs (2.5 mg total) by nebulization every 6 hours as needed for Wheezing or Shortness of Breath     Vitals and Physical Exam     Alicia Mcdaniel's  height is 1.676 m (5\' 6" ) and weight is 123.4 kg (272 lb). Her blood pressure is 126/67 and her pulse is 77.  Body mass index is 43.9 kg/m.    Physical Exam  Constitutional:       Appearance: She is obese.   HENT:      Head: Normocephalic and atraumatic.   Eyes:      Conjunctiva/sclera: Conjunctivae normal.   Neck:      Musculoskeletal: Neck supple.  Cardiovascular:      Rate and Rhythm: Normal rate and regular rhythm.      Comments: Heart sounds are quite distant but her rhythm appears to be regular and sinus on auscultation. Soft holosystolic murmur of mitral valve regurgitation is noted.  Pulmonary:      Effort: Pulmonary effort is normal.      Breath sounds: Normal breath sounds.   Abdominal:      Palpations: Abdomen is soft.   Musculoskeletal:      Comments: Evidence of mild arthritis in lower extremity joints is noted.   Skin:     General: Skin is warm.   Neurological:      General: No focal deficit present.      Mental Status: She is alert.       Laboratory Data     Hematology:   Results in Past 730 Days  Result Component Current Result Previous Result   WBC 7.2 (12/04/2017) Not in Time Range   Hemoglobin 13.8  (12/04/2017) Not in Time Range   Hematocrit 45 (12/04/2017) Not in Time Range   Platelets 291 (12/04/2017) Not in Time Range     Chemistry:   Results in Past 730 Days  Result Component Current Result Previous Result   Sodium 140 (01/26/2018) 140 (12/04/2017)   Potassium 4.4 (01/26/2018) 4.8 (12/04/2017)   Creatinine 0.86 (01/26/2018) 0.80 (12/04/2017)   Glucose 101 (H) (01/26/2018) 97 (12/04/2017)   Calcium 9.8 (01/26/2018) 9.2 (12/04/2017)   Hemoglobin A1C 5.7 (H) (12/04/2017) Not in Time Range   AST 21 (12/04/2017) Not in Time Range   ALT 30 (12/04/2017) Not in Time Range   TSH 1.14 (12/04/2017) Not in Time Range     Cardiac:   Results in Past 730 Days  Result Component Current Result Previous Result   NT-pro BNP 1,619 (H) (12/04/2017) Not in Time Range     Lipids:   Results in Past 730 Days  Result Component Current Result Previous Result   Cholesterol 198 (12/04/2017) Not in Time Range   HDL 80 (H) (12/04/2017) Not in Time Range   Triglycerides 60 (12/04/2017) Not in Time Range   LDL Calculated 106 (12/04/2017) Not in Time Range   Chol/HDL Ratio 2.5 (12/04/2017) Not in Time Range     Cardiac/Imaging Data & Risk Scores     ECG: Normal sinus rhythm. No significant ST segment or T-wave abnormalities are noted.         Echo Complete 12/17/2017    Narrative Limited imaging (poor acoustic windows; unable to give echo contrast).   Mildly dilated LV cavity with mild to moderate global LV systolic   dysfunction. Aortic and mitral valve sclerosis. Normal RV size with mildly   reduced RV systolic function. Unable to accurately assess pulmonary artery   systolic function.            Holter Monitor - 48 hour 12/30/2017    Narrative Holter Summary     Total Beats 299,191 Recording Date 12/16/17   Length Recorded 47 hours 53 minutes Analysis Date 12/25/17     Overall Rates    Maximum HR 147 bpm on 12/18/17 10:13   Mean HR 104 bpm   Minimum HR 70 bpm on 12/17/17 22:04     Ectopy     PVC Beats   PAC Beats    Count 3,086  Count 3,244    Percent 1.03 %  Percent 1.08 %   Max/Hr 118 on 12/16/17 14:0  Max/Hr 253 on 12/16/17 19:0  Pauses 0 Longest  on       Ventricular Arrhythmias Supraventricular Arrhythmias     VT   SVT 15   Longest  on    Longest 15 on Thu 7:34 PM   Max Rate  on    Max Rate 171 bpm on Thu 7:34 PM   Couplet 18  PAC Couplet 94   Triplet 0      R on T       Bigeminy 1      Trigeminy 0        Symptoms/Comments:     --  The rhythm during the 47 Hours and 53 minutes of recording was   basically an Atrial ectopic tachycardia with intermittent episodes,   sometimes very brief, of changes to a Sinus mechanism.  Episodes of an   Atrial ectopic tachycardia frequently began with a premature   Supraventricular beat or paired Supraventricular ectopics and ended with a   premature Supraventricular ectopic.  Rare episodes during Sinus rhythm of   P wave changes and a shift in atrial mechanism (ie:  Fri 7:51 AM, Fri 7:54   AM).    --  Hourly heart rates averaged between 85 to 138 BPM.  The minimum   average rate was 70 BPM.  The maximum average rate, associated with atrial   ectopic rhythm, was 147 BPM (Fri 10:13 AM).  The average rate during the   recording was 104 BPM.  See the hourly counts and heart rate graphs.    --  Due to the abrupt rate changes and frequency of the Atrial ectopic   mechanism, Supraventricular ectopy represents an approximate number.    Episodes of Atrial ectopic rhythm are not included in the counts of   ectopy.  Premature Supraventricular ectopics during Atrial ectopic rhythm   were included in the counts along with some of the beats at the onset of   the episodes and at the end.  Supraventricular ectopics during Sinus   rhythm are included in the counts.    --  There were 3244 Supraventricular ectopics counted during the recording   with 94 episodes of paired Supraventricular ectopics.  Very rare   Supraventricular ectopics demonstrated altered conduction.    --  Fifteen runs of a Supraventricular ectopic tachycardia  were counted,   some during episodes of Atrial ectopic tachycardia, lasting 3 to 15 beats   with rates of 117 to 171 BPM.  Few episodes demonstrated isolated 2:1 AV   conduction (ie:  Fri 5:39 AM, Fri 3:33 AM).  The longest run was also the   peak rate.    --  Approximately 3086 multiform Ventricular ectopics with around 64.5 %   of one morphology (ie:  Wed 6:00 PM) with the remainder of multiple   morphologies.  Eighteen episodes of paired Ventricular ectopics were   observed.  One, 3 beat episode of Ventricular bigeminy.  No VT.    --  Symptom of "tired" noted Wed 12:30 PM to 2:00 PM was associated with   predominantly atrial ectopic tachycardia (see hourly counts).  Symptom of   "slight discomfort breast plate 1-61 min" noted Thu 6:00 AM was associated   with Sinus rhythm in the 70s and 80s alternating with an atrial ectopic   rhythm around 100 to 108 BPM and rare Ventricular ectopics.  Symptom of   "very tired/ sweaty, clammy" noted Thu 4:30 PM was associated with an   Atrial ectopic tachycardia with rates in the  120s and 130s with few brief   episodes of Sinus rhythm and rare Ventricular ectopics.  Symptom of   "tired" noted Thu 5:00 PM with rate of 113 BPM was associated with Atrial   ectopic tachycardia with rates of 113 to 122 BPM and brief Sinus rhythm   with Supraventricular ectopics and rare Ventricular ectopics.  The same   symptom noted Thu 9:00 PM with rate of 82 BPM was associated with Sinus   rhythm with Supraventricular ectopics and rare Ventricular ectopics.    Atrial ectopic tachycardia was noted at 9:08 PM.  Symptom of "occasional   twinge of pain/ pressure (mild) lasting 5 sec to 2 min" noted Fri 5:00 AM   was associated with Atrial ectopic tachycardia at 110 BPM with Sinus   rhythm at 5:02 AM.  No sig st changes.  Rare Supraventricular ectopics and   rare Ventricular ectopics.     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  Other than few   episodes of a  Supraventricular ectopic tachycardia with isolated 2:1 AV   conduction, no AV block.  No pauses over 2.0 seconds.  Rare premature   Supraventricular ectopics.  Few brief episodes of Supraventricular ectopic   tachycardia.  Rare Ventricular ectopy.  No VT.  Symptoms noted.  See   symptoms/ comments above for details.    Read by:  Ansini/    For questions regarding this interpretation or for clinical consultation,  please call [phone].       To view the entire scanned Holter tracings in eRecord please click on the   blue hyperlink below under the Scans on Order section.    EP ATTENDING: I have reviewed the documentation.  I agree with the   findings as documented.     Mindi Slicker, MD       Impression and Plan     Alicia Mcdaniel is 73 years old and has past medical history significant for asthma, cardiomyopathy / congestive heart failure (diagnosed 2007; never adequately treated), persistent atrial tachycardia noted on 12/30/17 Holter monitor, rare palpitations and obstructive sleep apnea. She is in my office for a follow-up visit. She was last seen 01/15/18. At that visit, we had her increase carvedilol to 6.25 mg twice a day. With higher dose of carvedilol, she has noted an improvement in her energy level. Today, she is in normal sinus rhythm. It appears that carvedilol use as helped decrease frequency of atrial tachycardia. Patient has been checking her blood pressure at home and she has noted relatively low blood pressure readings (systolic blood pressure varying between 90 mmHg and 105 mmHg on most occasions. Her heart rate has also been better (around 70-80 bpm). She still complains of shortness of breath when she climbs stairs or tries to walk quickly. This may be related to her obesity and physically deconditioned state. Patient wanted to know if she could take fish oil and vitamins. I told her that it was okay to do that. She wanted to know if aspirin was necessary. Given several recent trials  questioning utility of aspirin in primary prevention, I reviewed the data and told her that aspirin decreased the risk of future heart attacks and strokes but at the same time, increased risk of bleeding. After discussion, she decided that she would not take aspirin. We also talked about chronic management of cardiomyopathy. Patient has travel plans in the near future. I told her that there were no  restrictions on her travel. I also asked her to start an exercise program at a lower level of exercise and slowly build up her stamina.     Following problems were addressed:    1. Cardiomyopathy:  patient is on spironolactone and carvedilol 6.25 mg twice a day. Her blood pressure is relatively low and we cannot increase carvedilol or add an ACE inhibitor. Patient is resistant to the idea of adding any medications, any way. We will have her continue current medications and see her back in 3 months. At that time, we will also perform a transthoracic echo to assess her LV ejection fraction. I am hopeful that there will be an improvement in her LVEF. She does not have any signs of clinical congestive heart failure and her blood pressure is relatively low. I had her decrease her furosemide from 20 mg every day to 10 mg every day. If she continues to do well on this very low dose of furosemide, it may be stopped.    2. Atrial tachycardia:  patient has history of atrial tachycardia which, has caused a decline in her LVEF. Today, she is in normal sinus rhythm. Based on blood pressure and heart rate values measured by her at home, it appears that higher dose of carvedilol has been keeping her mostly in normal sinus rhythm. I have suggested that she continue current dose of carvedilol.    3. Hypertension: Blood pressure is relatively low. We will continue current relatively low dose of carvedilol. It has helped her.    4. Hyperlipidemia: Fasting lipid profile was reviewed from 12/04/17. It showed total cholesterol 198,  triglycerides 60, HDL cholesterol 80, LDL cholesterol 106. She will benefit from statin use.  We reviewed statin use in her case and suggested that she started a statin. Patient prefers not to take a statin.     Kathrynn Speed, MD  Electronically signed on 03/17/2018 at 12:28 PM.

## 2018-03-28 LAB — EKG 12-LEAD
QRS: -2 deg
QRSD: 86 ms
QT: 408 ms
QTc: 450 ms
Rate: 73 {beats}/min
T: -15 deg

## 2018-03-30 ENCOUNTER — Telehealth: Payer: Self-pay | Admitting: Cardiology

## 2018-03-30 NOTE — Telephone Encounter (Signed)
Patient thinks pain in wrist is likely related to lifting a heavy water bottle yesterday - denied chest pain or other cardiac symptoms.  Patient advised to rest, may try prn tylenol,  and call back for further concerns

## 2018-03-30 NOTE — Telephone Encounter (Signed)
Chaudhary pt- pt reports a pain in left wrist and hand, not sure about her arm. States she "feels off" and was sweaty. At 7:30 am her BP was 120/83 HR 126. Later in the morning it was 108/73 HR 74. Please advise. Thank you.

## 2018-04-19 ENCOUNTER — Telehealth: Payer: Self-pay | Admitting: Cardiology

## 2018-04-19 NOTE — Telephone Encounter (Signed)
Patient calling regarding COVID-19. She states she has someone living with her that left the home to babysit and she wants to know if Dr. Brendolyn Patty thinks its safe. I asked her if she notified PCP but she said she prefers to discuss with [facility].

## 2018-04-19 NOTE — Telephone Encounter (Signed)
Spoke with patient and let her know that our office is recommending social distancing at this time to all our patients. Keeping 6 feet of distance, wearing a mask if out in public, frequent hand washing. 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 Patient understood and was agreeable with this plan. Madalyn Rob, RN

## 2018-06-17 ENCOUNTER — Other Ambulatory Visit: Payer: Medicare (Managed Care)

## 2018-06-17 ENCOUNTER — Ambulatory Visit: Payer: Medicare (Managed Care) | Admitting: Cardiology

## 2018-06-25 ENCOUNTER — Inpatient Hospital Stay
Admit: 2018-06-25 | Discharge: 2018-06-25 | Disposition: A | Discharge status: Home / Self Care | Payer: Medicare (Managed Care)

## 2018-06-25 ENCOUNTER — Ambulatory Visit: Payer: Medicare (Managed Care) | Admitting: Cardiology

## 2019-01-13 ENCOUNTER — Encounter: Payer: Self-pay | Admitting: Emergency Medicine

## 2019-01-13 ENCOUNTER — Other Ambulatory Visit: Payer: Self-pay | Admitting: Cardiovascular Disease

## 2019-01-13 ENCOUNTER — Emergency Department
Admission: EM | Admit: 2019-01-13 | Discharge: 2019-01-13 | Disposition: A | Discharge status: Home / Self Care | Payer: Medicare (Managed Care) | Source: Ambulatory Visit | Attending: Emergency Medicine | Admitting: Emergency Medicine

## 2019-01-13 ENCOUNTER — Other Ambulatory Visit: Payer: Self-pay

## 2019-01-13 ENCOUNTER — Emergency Department: Payer: Medicare (Managed Care)

## 2019-01-13 ENCOUNTER — Other Ambulatory Visit
Admission: RE | Admit: 2019-01-13 | Discharge: 2019-01-13 | Disposition: A | Discharge status: Home / Self Care | Payer: Medicare (Managed Care) | Source: Ambulatory Visit

## 2019-01-13 DIAGNOSIS — R0789 Other chest pain: Secondary | ICD-10-CM | POA: Diagnosis not present

## 2019-01-13 DIAGNOSIS — R000 Tachycardia, unspecified: Secondary | ICD-10-CM | POA: Diagnosis not present

## 2019-01-13 DIAGNOSIS — R5383 Other fatigue: Secondary | ICD-10-CM | POA: Diagnosis not present

## 2019-01-13 DIAGNOSIS — R0602 Shortness of breath: Secondary | ICD-10-CM | POA: Diagnosis not present

## 2019-01-13 DIAGNOSIS — R05 Cough: Secondary | ICD-10-CM | POA: Diagnosis not present

## 2019-01-13 DIAGNOSIS — R410 Disorientation, unspecified: Secondary | ICD-10-CM | POA: Diagnosis not present

## 2019-01-13 DIAGNOSIS — R918 Other nonspecific abnormal finding of lung field: Secondary | ICD-10-CM | POA: Diagnosis not present

## 2019-01-13 DIAGNOSIS — U071 COVID-19: Secondary | ICD-10-CM | POA: Diagnosis not present

## 2019-01-13 LAB — COVID-19 NAAT (PCR): COVID-19 NAAT (PCR): POSITIVE — AB

## 2019-01-13 LAB — CBC AND DIFFERENTIAL
Baso # K/uL: 0.1 10*3/uL (ref 0.0–0.1)
Basophil %: 1 %
Eos # K/uL: 0 10*3/uL (ref 0.0–0.4)
Eosinophil %: 0 %
Hematocrit: 45 % (ref 34–45)
Hemoglobin: 13.6 g/dL (ref 11.2–15.7)
Lymph # K/uL: 1.3 10*3/uL (ref 1.2–3.7)
Lymphocyte %: 26 %
MCH: 29 pg/cell (ref 26–32)
MCHC: 30 g/dL — ABNORMAL LOW (ref 32–36)
MCV: 96 fL — ABNORMAL HIGH (ref 79–95)
Mono # K/uL: 0.7 10*3/uL (ref 0.2–0.9)
Monocyte %: 14 %
Neut # K/uL: 2.7 10*3/uL (ref 1.6–6.1)
Nucl RBC # K/uL: 0 10*3/uL (ref 0.0–0.0)
Nucl RBC %: 0 /100 WBC (ref 0.0–0.2)
Platelets: 250 10*3/uL (ref 160–370)
RBC: 4.7 MIL/uL (ref 3.9–5.2)
RDW: 12.3 % (ref 11.7–14.4)
Seg Neut %: 58 %
WBC: 4.7 10*3/uL (ref 4.0–10.0)

## 2019-01-13 LAB — PLASMA PROF 7 (ED ONLY)
Anion Gap,PL: 12 (ref 7–16)
CO2,Plasma: 25 mmol/L (ref 20–28)
Chloride,Plasma: 97 mmol/L (ref 96–108)
Creatinine: 0.81 mg/dL (ref 0.51–0.95)
GFR,Black: 82 *
GFR,Caucasian: 71 *
Glucose,Plasma: 110 mg/dL — ABNORMAL HIGH (ref 60–99)
Potassium,Plasma: 4.7 mmol/L — ABNORMAL HIGH (ref 3.3–4.6)
Sodium,Plasma: 134 mmol/L (ref 133–145)
UN,Plasma: 14 mg/dL (ref 6–20)

## 2019-01-13 LAB — INFLUENZA A: Influenza A PCR: 0

## 2019-01-13 LAB — INFLUENZA B PCR: Influenza B PCR: 0

## 2019-01-13 LAB — NT-PRO BNP: NT-pro BNP: 512 pg/mL (ref 0–900)

## 2019-01-13 LAB — PERFORMING LAB

## 2019-01-13 LAB — DIFF MANUAL
Diff Based On: 100 CELLS
React Lymph %: 1 % (ref 0–6)

## 2019-01-13 LAB — RSV PCR: RSV PCR: 0

## 2019-01-13 LAB — COVID-19 PCR

## 2019-01-13 LAB — HOLD BLUE

## 2019-01-13 LAB — TROPONIN T 0 HR HIGH SENSITIVITY (IP/ED ONLY): TROP T 0 HR High Sensitivity: 12 ng/L (ref 0–13)

## 2019-01-13 MED ORDER — SODIUM CHLORIDE 0.9 % FLUSH FOR PUMPS *I*
0.0000 mL/h | INTRAVENOUS | Status: DC | PRN
Start: 2019-01-13 — End: 2019-01-14

## 2019-01-13 MED ORDER — DEXTROSE 5 % FLUSH FOR PUMPS *I*
0.0000 mL/h | INTRAVENOUS | Status: DC | PRN
Start: 2019-01-13 — End: 2019-01-14

## 2019-01-13 MED ORDER — DOXYCYCLINE HYCLATE 100 MG PO TABS *I*
100.0000 mg | ORAL_TABLET | Freq: Two times a day (BID) | ORAL | 0 refills | Status: DC
Start: 2019-01-13 — End: 2019-01-13
  Filled 2019-01-13: qty 10, 5d supply, fill #0

## 2019-01-13 MED ORDER — DOXYCYCLINE 100 MG / 110 ML NS *I*
INTRAVENOUS | Status: DC
Start: 2019-01-13 — End: 2019-01-13
  Filled 2019-01-13: qty 100

## 2019-01-13 MED ORDER — DOXYCYCLINE 100 MG / 110 ML NS *I*
100.0000 mg | Freq: Once | INTRAVENOUS | Status: DC
Start: 2019-01-13 — End: 2019-01-13

## 2019-01-13 NOTE — First Provider Contact (Signed)
ED First Provider Contact Note    Initial provider evaluation performed by   ED First Provider Contact     Date/Time Event User Comments    01/13/19 1926 ED First Provider Contact Sayda Grable, [facility] Initial Face to Face Provider Contact        73 year old female reports 2 weeks of cough, SOB. + exposure to COVID.    Vital signs reviewed.    Assessment: cough, SOB    Orders placed:  LABS and XRAYS     Patient requires further evaluation.     Faith Rogue, [place], 01/13/2019, 7:26 PM     Faith Rogue, [place]  01/13/19 1928

## 2019-01-13 NOTE — ED Provider Notes (Addendum)
History     Chief Complaint   Patient presents with    Shortness of Breath    COVID-19 Concern       History provided by:  Patient      Alicia Mcdaniel is a 73 y.o. female with history of OSA, congestive heart failure, asthma, atrial tachycardia presenting with 2 weeks of shortness of breath, productive cough, and generalized fatigue.  Patient reports that she had a Covid exposure in early December and that was her grand son.  She endorses having a mild midsternal chest pain right now as well.  Per her report, she was seen at urgent care earlier today and was sent to the emergency department for evaluation for high heart rates.  She endorses orthopnea.  No vomiting or abdominal pain.    Medical/Surgical/Family History     Past Medical History:   Diagnosis Date    Asthma     Colonic polyp     Congestive heart failure     OSA (obstructive sleep apnea)         Patient Active Problem List   Diagnosis Code    Moderate persistent asthma with exacerbation J45.41    History of congestive heart failure Z86.79    Obstructive sleep apnea G47.33    Tachycardia R00.0            Past Surgical History:   Procedure Laterality Date    SALPINGO-OOPHORECTOMY Left     TUBAL LIGATION       Family History   Problem Relation Age of Onset    Hypertension Mother     Alzheimer's disease Mother     Pancreatic Cancer Father     Lung disease Father     Hypertension Father     Other Sister         Brain Tumor    Other Brother         Brain Tumor    Clotting disorder Brother     Other Daughter         Brain Tumor    Thyroid disease Daughter     Blood clots Daughter     Clotting disorder Daughter     Clotting disorder Brother           Social History     Tobacco Use    Smoking status: Never Smoker    Smokeless tobacco: Never Used   Substance Use Topics    Alcohol use: Yes     Frequency: Monthly or less     Drinks per session: 1 or 2     Binge frequency: Never    Drug use: Not Currently     Living Situation      Questions Responses    Patient lives with     Homeless     Caregiver for other family member     External Services     Employment     Domestic Violence Risk                 Review of Systems   Review of Systems   Constitutional: Positive for activity change and fatigue. Negative for fever.   HENT: Positive for congestion. Negative for sore throat.    Eyes: Negative for pain.   Respiratory: Positive for cough and shortness of breath.    Cardiovascular: Positive for chest pain. Negative for palpitations and leg swelling.   Gastrointestinal: Negative for abdominal pain, diarrhea, nausea and vomiting.   Musculoskeletal: Negative for myalgias.  Skin: Negative for wound.   Neurological: Negative for headaches.   Psychiatric/Behavioral: Negative for confusion.       Physical Exam     Triage Vitals  Triage Start: Start, (01/13/19 1915)   First Recorded BP: 106/74, Resp: 18, Temp: 36.1 C (97 F) Oxygen Therapy SpO2: 95 %, O2 Device: None (Room air), Heart Rate: 80, (01/13/19 1916)  .  First Pain Reported  0-10 Scale: 0, (01/13/19 1916)       Physical Exam  Vitals signs and nursing note reviewed.   Constitutional:       General: She is not in acute distress.     Appearance: She is well-developed.   HENT:      Head: Normocephalic and atraumatic.   Eyes:      Conjunctiva/sclera: Conjunctivae normal.   Cardiovascular:      Rate and Rhythm: Normal rate and regular rhythm.      Heart sounds: Normal heart sounds. No murmur. No friction rub. No gallop.    Pulmonary:      Effort: Pulmonary effort is normal. No respiratory distress.      Breath sounds: No stridor. Examination of the right-lower field reveals decreased breath sounds. Examination of the left-lower field reveals decreased breath sounds. Decreased breath sounds present. No wheezing or rales.      Comments: Diminished breath sounds at the bases.  No rales or wheezing.  Abdominal:      General: Bowel sounds are normal. There is no distension.      Palpations: Abdomen  is soft. There is no mass.      Tenderness: There is no abdominal tenderness. There is no guarding or rebound.   Skin:     General: Skin is warm.   Neurological:      Mental Status: She is alert.         Medical Decision Making          Patient seen by me on: 01/13/19     Assessment:   73 year old female with subacute cough and shortness of breath.  She was reportedly tachycardic at urgent care today.  Currently, has normal vital signs.  No oxygen requirement. Chest x-ray notable for b/l opacities. Could be viral vs bacterial. No ongoing SIRS response.     Differential diagnosis:   COVID-19, URI, pneumonia, pulmonary edema, PE, ACS, pneumothorax, pneumonia        Plan:     Orders Placed This Encounter   Procedures    Influenza A PCR    Influenza B PCR    RSV PCR    *Chest standard frontal and lateral views    COVID-19 PCR    CBC and differential    Plasma profile 7 (Adult ED only)    Troponin T 0 HR High Sensitivity    Troponin T 3 HR W/ Delta High Sensitivity    NT-pro BNP    Hold blue    Check pulse oximetry while ambulating    EKG: initial    EKG: follow up    EKG 12 lead    Insert peripheral IV         EKG Interpretation: NSR, no ischemic change    ED Course and Disposition:     ED Course as of Jan 12 2225   Thu Jan 13, 2019   2104 Multifocal patchy opacities in the lung bases bilaterally, which is concerning for multifocal infection/inflammation.       2109 O2 sat >90% on ambulation.  2138 Potassium,Plasma(!): 4.7   2138 UN,Plasma: 14   2138 Creatinine: 0.81   2138 WBC: 4.7   2138 TROP T 0 HR High Sensitivity: 12   Not hypoxic nor tachycardic. No leukocytosis.   Will discharge with doxycycline for presumed CAP. Primary care follow up in 1-2 days.       Addendum: Patient tested COVID positive. Discontinued antibiotics. Outpatient follow up.     Gretta CoolWen Rong Talonda Artist, MD          Gretta CoolZhang, Celines Femia Rong, MD  01/13/19 2227       Gretta CoolZhang, Irini Leet Rong, MD  01/13/19 [phone]

## 2019-01-13 NOTE — ED Triage Notes (Addendum)
Seen at [facility] for SOB; RA sats in 90's. SpO2 of 95% on RA in triage. Pt reports potential COVID exposure on 12/8. Pt denies CP. Hx of asthma.        Triage Note   Lajoyce Corners, RN

## 2019-01-13 NOTE — ED Notes (Signed)
Patient was ambulatory with stable vital signs upon discharge. Discharge instructions and follow up care were reviewed. Patient verbalized understanding of discharge instructions. Patient has a safe ride home.

## 2019-01-13 NOTE — ED Notes (Signed)
01/13/19 1858   Expected Call-In Information   ED Service [facility] Adult Call-in   PCP/Service Referral [facility]   Call received from [facility]? No   Pt Info note/Reason for sending 2 week Hx cough, SOB, orthopnea. Today more confused.   Pt Coming from Urgent Care  (via EMS)   Requested Evaluation By Adult ED   Does referring physician have admitting privileges? No   Call reported to Call in note     Past Medical History:   Diagnosis Date    Asthma     Colonic polyp     Congestive heart failure     OSA (obstructive sleep apnea)      Patient Active Problem List   Diagnosis Code    Moderate persistent asthma with exacerbation J45.41    History of congestive heart failure Z86.79    Obstructive sleep apnea G47.33    Tachycardia R00.0     Past Surgical History:   Procedure Laterality Date    SALPINGO-OOPHORECTOMY Left     TUBAL LIGATION       No results found for this or any previous visit (from the past 64 hour(s)).

## 2019-01-13 NOTE — ED Notes (Signed)
Pt feeling unwell x2 weeks. Went to UC and sent here for dehydration. Sent on O2 now sating 96% on RA endorsing SOB. Endorsing generalized weakness and nausea.

## 2019-01-13 NOTE — Discharge Instructions (Addendum)
You were seen for COVID-19. At this time you do not need to be admitted because your oxygen saturation is normal while at rest and while exerting yourself.    If your symptoms worsen (worsening shortness of breath, severe nausea, passing out, chest pains), you should return to the emergency department. Please continue to quarantine (at least 14 days after start of your symptoms).

## 2019-01-14 ENCOUNTER — Other Ambulatory Visit: Payer: Self-pay

## 2019-01-15 LAB — COVID-19 NAAT (PCR): COVID-19 NAAT (PCR): POSITIVE — AB

## 2019-01-15 LAB — COVID-19 PCR

## 2019-01-17 ENCOUNTER — Telehealth: Payer: Self-pay | Admitting: Primary Care

## 2019-01-17 LAB — EKG 12-LEAD
P: 141 deg
PR: 176 ms
QRS: -9 deg
QRSD: 88 ms
QT: 366 ms
QTc: 484 ms
Rate: 105 {beats}/min
T: -31 deg

## 2019-01-17 NOTE — Telephone Encounter (Signed)
Left message for patient to call office. Patient is covid positive. Offer ED fu on video or telephone.

## 2019-01-28 ENCOUNTER — Encounter: Payer: Self-pay | Admitting: Primary Care

## 2019-01-28 ENCOUNTER — Other Ambulatory Visit: Payer: Self-pay | Admitting: Primary Care

## 2019-01-28 ENCOUNTER — Other Ambulatory Visit: Payer: Self-pay | Admitting: Cardiology

## 2019-01-28 ENCOUNTER — Ambulatory Visit: Payer: Medicare (Managed Care) | Admitting: Primary Care

## 2019-01-28 DIAGNOSIS — U071 COVID-19: Secondary | ICD-10-CM | POA: Diagnosis not present

## 2019-01-28 DIAGNOSIS — I420 Dilated cardiomyopathy: Secondary | ICD-10-CM

## 2019-01-28 DIAGNOSIS — Z8679 Personal history of other diseases of the circulatory system: Secondary | ICD-10-CM

## 2019-01-28 DIAGNOSIS — J4541 Moderate persistent asthma with (acute) exacerbation: Secondary | ICD-10-CM

## 2019-01-28 NOTE — Progress Notes (Signed)
Telephone Visit     This is an established patient visit.    Location of Telemedicine Provider: hospital / clinical location    Reason for visit: COVID-19 Concern      HPI:  Ms. Struss is a 74 year old female presenting for follow-up of ED visit.    She presented to the ED on 01/13/2019 with shortness of breath and known COVID contact. She was diagnosed with COVID-19. Her work-up was otherwise unremarkable. She was discharged asthma management.     She reports that she is doing well. Today was her first day out of her home. [place]. She reports her breathing is good. She denies any chest pain. She is out of her medications - torsemide and advair - which she called for this morning.     She offers no further concerns.     Patient's problem list, allergies, and medications were reviewed and updated as appropriate. Please see the EHR for full details.    Physical Exam:    This visit was performed during a pandemic event, thus the physical exam was not performed. Speaking in full sentences, no acute respiratory distress.    Assessment Plan:  Ms. Quiggle is a 74 year old female presenting for ED visit follow-up of COVID-19 infection. ED visit note reviewed along with labs (2 minutes). Spent approximately 4 minutes on the phone with patient. She is doing well without any acute concerns. She reports her breathing is at baseline. Recommend that she continue her current medications without change.     The plan was discussed with the patient and the patient/patient rep demonstrated understanding to the provider's satisfaction.    Consent was previously obtained from the patient to complete this telephone consult; including the potential for financial liability.    5-10 minutes were spent on the phone with the patient, patient representatives, and/or other attendees.     Delma Officer, MD  01/28/2019    *This note was dictated using [employer], reasonable efforts were made to correct for  any dictation errors.

## 2019-01-28 NOTE — Telephone Encounter (Signed)
Patient returned call, telephone appointment scheduled for 3:30 today, she does not have video capability.

## 2019-02-04 ENCOUNTER — Other Ambulatory Visit: Payer: Self-pay | Admitting: Pulmonary Disease

## 2019-02-04 DIAGNOSIS — Z23 Encounter for immunization: Secondary | ICD-10-CM

## 2019-02-24 ENCOUNTER — Ambulatory Visit: Payer: Medicare (Managed Care) | Attending: Pulmonary Disease

## 2019-02-24 DIAGNOSIS — Z23 Encounter for immunization: Secondary | ICD-10-CM | POA: Insufficient documentation

## 2019-03-24 ENCOUNTER — Ambulatory Visit: Payer: Medicare (Managed Care) | Attending: Pulmonary Disease

## 2019-03-24 DIAGNOSIS — Z23 Encounter for immunization: Secondary | ICD-10-CM | POA: Insufficient documentation

## 2019-06-20 ENCOUNTER — Encounter: Payer: Self-pay | Admitting: Family Medicine

## 2019-06-20 ENCOUNTER — Other Ambulatory Visit: Payer: Self-pay

## 2019-06-20 ENCOUNTER — Ambulatory Visit (INDEPENDENT_AMBULATORY_CARE_PROVIDER_SITE_OTHER): Payer: Medicare Other | Admitting: Family Medicine

## 2019-06-20 VITALS — BP 143/96 | HR 108 | Ht 67.0 in | Wt 269.0 lb

## 2019-06-20 DIAGNOSIS — I5032 Chronic diastolic (congestive) heart failure: Secondary | ICD-10-CM

## 2019-06-20 DIAGNOSIS — Z1159 Encounter for screening for other viral diseases: Secondary | ICD-10-CM | POA: Diagnosis not present

## 2019-06-20 DIAGNOSIS — Z0000 Encounter for general adult medical examination without abnormal findings: Secondary | ICD-10-CM | POA: Diagnosis not present

## 2019-06-20 DIAGNOSIS — R7301 Impaired fasting glucose: Secondary | ICD-10-CM

## 2019-06-20 DIAGNOSIS — J4530 Mild persistent asthma, uncomplicated: Secondary | ICD-10-CM

## 2019-06-20 DIAGNOSIS — Z1231 Encounter for screening mammogram for malignant neoplasm of breast: Secondary | ICD-10-CM

## 2019-06-20 DIAGNOSIS — B372 Candidiasis of skin and nail: Secondary | ICD-10-CM

## 2019-06-20 MED ORDER — FUROSEMIDE 40 MG PO TABS: 40.0000 mg | ORAL_TABLET | Freq: Every day | ORAL | 1 refills | Status: DC

## 2019-06-20 MED ORDER — LISINOPRIL 5 MG PO TABS
5.0000 mg | ORAL_TABLET | Freq: Every day | ORAL | 1 refills | Status: DC
Start: 2019-06-20 — End: 2019-07-07

## 2019-06-20 MED ORDER — NYSTATIN 100000 UNIT/GM EX POWD
1.0000 | Freq: Three times a day (TID) | CUTANEOUS | 1 refills | Status: DC
Start: 2019-06-20 — End: 2019-12-23

## 2019-06-20 NOTE — Patient Instructions (Signed)
Health Maintenance After Age 76 After age 76, you are at a higher risk for certain long-term diseases and infections as well as injuries from falls. Falls are a major cause of broken bones and head injuries in people who are older than age 76. Getting regular preventive care can help to keep you healthy and well. Preventive care includes getting regular testing and making lifestyle changes as recommended by your health care provider. Talk with your health care provider about:  Which screenings and tests you should have. A screening is a test that checks for a disease when you have no symptoms.  A diet and exercise plan that is right for you. What should I know about screenings and tests to prevent falls? Screening and testing are the best ways to find a health problem early. Early diagnosis and treatment give you the best chance of managing medical conditions that are common after age 76. Certain conditions and lifestyle choices may make you more likely to have a fall. Your health care provider may recommend:  Regular vision checks. Poor vision and conditions such as cataracts can make you more likely to have a fall. If you wear glasses, make sure to get your prescription updated if your vision changes.  Medicine review. Work with your health care provider to regularly review all of the medicines you are taking, including over-the-counter medicines. Ask your health care provider about any side effects that may make you more likely to have a fall. Tell your health care provider if any medicines that you take make you feel dizzy or sleepy.  Osteoporosis screening. Osteoporosis is a condition that causes the bones to get weaker. This can make the bones weak and cause them to break more easily.  Blood pressure screening. Blood pressure changes and medicines to control blood pressure can make you feel dizzy.  Strength and balance checks. Your health care provider may recommend certain tests to check your  strength and balance while standing, walking, or changing positions.  Foot health exam. Foot pain and numbness, as well as not wearing proper footwear, can make you more likely to have a fall.  Depression screening. You may be more likely to have a fall if you have a fear of falling, feel emotionally low, or feel unable to do activities that you used to do.  Alcohol use screening. Using too much alcohol can affect your balance and may make you more likely to have a fall. What actions can I take to lower my risk of falls? General instructions  Talk with your health care provider about your risks for falling. Tell your health care provider if: ? You fall. Be sure to tell your health care provider about all falls, even ones that seem minor. ? You feel dizzy, sleepy, or off-balance.  Take over-the-counter and prescription medicines only as told by your health care provider. These include any supplements.  Eat a healthy diet and maintain a healthy weight. A healthy diet includes low-fat dairy products, low-fat (lean) meats, and fiber from whole grains, beans, and lots of fruits and vegetables. Home safety  Remove any tripping hazards, such as rugs, cords, and clutter.  Install safety equipment such as grab bars in bathrooms and safety rails on stairs.  Keep rooms and walkways well-lit. Activity   Follow a regular exercise program to stay fit. This will help you maintain your balance. Ask your health care provider what types of exercise are appropriate for you.  If you need a cane or   walker, use it as recommended by your health care provider.  Wear supportive shoes that have nonskid soles. Lifestyle  Do not drink alcohol if your health care provider tells you not to drink.  If you drink alcohol, limit how much you have: ? 0-1 drink a day for women. ? 0-2 drinks a day for men.  Be aware of how much alcohol is in your drink. In the [place] one drink equals one typical bottle of beer (12  oz), one-half glass of wine (5 oz), or one shot of hard liquor (1 oz).  Do not use any products that contain nicotine or tobacco, such as cigarettes and e-cigarettes. If you need help quitting, ask your health care provider. Summary  Having a healthy lifestyle and getting preventive care can help to protect your health and wellness after age 76.  Screening and testing are the best way to find a health problem early and help you avoid having a fall. Early diagnosis and treatment give you the best chance for managing medical conditions that are more common for people who are older than age 76.  Falls are a major cause of broken bones and head injuries in people who are older than age 76. Take precautions to prevent a fall at home.  Work with your health care provider to learn what changes you can make to improve your health and wellness and to prevent falls. This information is not intended to replace advice given to you by your health care provider. Make sure you discuss any questions you have with your health care provider. Document Revised: 04/22/2018 Document Reviewed: 11/12/2016 Elsevier Patient Education  2020 Elsevier Inc.  

## 2019-06-20 NOTE — Progress Notes (Signed)
Subjective:     Briana Silva is a 74 y.o. female and is here for a comprehensive physical exam. The patient reports no problems.  She does have a rash that began underneath both breasts just recently she says it is little bit sensitive.  And the skin seems to peel.  She is not currently exercising.  Denies any chest pain or shortness of breath.  Social History   Socioeconomic History  . Marital status: Widowed    Spouse name: Not on file  . Number of children: 2  . Years of education: Not on file  . Highest education level: Not on file  Occupational History  . Occupation: Retired.   Tobacco Use  . Smoking status: Never Smoker  . Smokeless tobacco: Never Used  Substance and Sexual Activity  . Alcohol use: No  . Drug use: No  . Sexual activity: Not on file  Other Topics Concern  . Not on file  Social History Narrative   No regular exercise.  Going to a nutrition center.    Social Determinants of Health   Financial Resource Strain:   . Difficulty of Paying Living Expenses:   Food Insecurity:   . Worried About Charity fundraiser in the Last Year:   . Arboriculturist in the Last Year:   Transportation Needs:   . Film/video editor (Medical):   Marland Kitchen Lack of Transportation (Non-Medical):   Physical Activity:   . Days of Exercise per Week:   . Minutes of Exercise per Session:   Stress:   . Feeling of Stress :   Social Connections:   . Frequency of Communication with Friends and Family:   . Frequency of Social Gatherings with Friends and Family:   . Attends Religious Services:   . Active Member of Clubs or Organizations:   . Attends Archivist Meetings:   Marland Kitchen Marital Status:   Intimate Partner Violence:   . Fear of Current or Ex-Partner:   . Emotionally Abused:   Marland Kitchen Physically Abused:   . Sexually Abused:    Health Maintenance  Topic Date Due  . Hepatitis C Screening  Never done  . INFLUENZA VACCINE  08/14/2019  . COLONOSCOPY  12/10/2020  . TETANUS/TDAP   09/09/2023  . DEXA SCAN  Completed  . COVID-19 Vaccine  Completed  . PNA vac Low Risk Adult  Completed    The following portions of the patient's history were reviewed and updated as appropriate: allergies, current medications, past family history, past medical history, past social history, past surgical history and problem list.  Review of Systems A comprehensive review of systems was negative.   Objective:    BP (!) 143/96   Pulse (!) 108   Ht 5\' 7"  (1.702 m)   Wt 269 lb (122 kg)   SpO2 97%   BMI 42.13 kg/m  General appearance: alert, cooperative and appears stated age Head: Normocephalic, without obvious abnormality, atraumatic Eyes: conj clear, EOMI, PEERLA Ears: normal TM's and external ear canals both ears Nose: Nares normal. Septum midline. Mucosa normal. No drainage or sinus tenderness. Throat: lips, mucosa, and tongue normal; teeth and gums normal Neck: no adenopathy, no carotid bruit, no JVD, supple, symmetrical, trachea midline and thyroid not enlarged, symmetric, no tenderness/mass/nodules Back: symmetric, no curvature. ROM normal. No CVA tenderness. Lungs: clear to auscultation bilaterally Breasts: normal appearance, no masses or tenderness Heart: regular rate and rhythm, S1, S2 normal, no murmur, click, rub or gallop Abdomen: soft,  non-tender; bowel sounds normal; no masses,  no organomegaly Extremities: extremities normal, atraumatic, no cyanosis or edema Pulses: 2+ and symmetric Skin: Skin color, texture, turgor normal. No rashes or lesions Lymph nodes: Cervical, supraclavicular, and axillary nodes normal. Neurologic: Alert and oriented X 3, normal strength and tone. Normal symmetric reflexes. Normal coordination and gait    Assessment:    Healthy female exam.    Plan:     See After Visit Summary for Counseling Recommendations   Keep up a regular exercise program and make sure you are eating a healthy diet Try to eat 4 servings of dairy a day, or if you  are lactose intolerant take a calcium with vitamin D daily.  Your vaccines are up to date.   Heart failure-she is on a diuretic but is not on an ACE inhibitor we will add back a low-dose of ACE inhibitor for blood pressure control as well as heart failure control.  Its not on her intolerance list.  Skin candidiasis under both breasts-we will treat with nystatin powder if not improving then consider treatment for erythrasma.  Mammogram order placed.  Skin tags-she also has some skin tags especially underneath her breast.  Offered to remove them at any point it is convenient for her.  Handout for Shing[employer] provided.

## 2019-06-21 ENCOUNTER — Other Ambulatory Visit: Payer: Self-pay

## 2019-06-21 DIAGNOSIS — J4531 Mild persistent asthma with (acute) exacerbation: Secondary | ICD-10-CM

## 2019-06-21 LAB — LIPID PANEL
Cholesterol: 233 mg/dL — ABNORMAL HIGH (ref <200)
HDL: 81 mg/dL (ref >=50)
LDL Cholesterol (Calc): 137 mg/dL (calc) — ABNORMAL HIGH
Non-HDL Cholesterol (Calc): 152 mg/dL (calc) — ABNORMAL HIGH (ref <130)
Total CHOL/HDL Ratio: 2.9 (calc) (ref <5.0)
Triglycerides: 48 mg/dL (ref <150)

## 2019-06-21 LAB — COMPLETE METABOLIC PANEL WITH GFR
AG Ratio: 1.2 (calc) (ref 1.0–2.5)
ALT: 16 U/L (ref 6–29)
AST: 25 U/L (ref 10–35)
Albumin: 4.1 g/dL (ref 3.6–5.1)
Alkaline phosphatase (APISO): 86 U/L (ref 37–153)
BUN: 16 mg/dL (ref 7–25)
CO2: 30 mmol/L (ref 20–32)
Calcium: 9.6 mg/dL (ref 8.6–10.4)
Chloride: 100 mmol/L (ref 98–110)
Creat: 0.82 mg/dL (ref 0.60–0.93)
GFR, Est African American: 81 mL/min/{1.73_m2} (ref >=60)
GFR, Est Non African American: 70 mL/min/{1.73_m2} (ref >=60)
Globulin: 3.3 g/dL (calc) (ref 1.9–3.7)
Glucose, Bld: 96 mg/dL (ref 65–99)
Potassium: 5 mmol/L (ref 3.5–5.3)
Sodium: 139 mmol/L (ref 135–146)
Total Bilirubin: 0.7 mg/dL (ref 0.2–1.2)
Total Protein: 7.4 g/dL (ref 6.1–8.1)

## 2019-06-21 LAB — CBC
HCT: 42.5 % (ref 35.0–45.0)
Hemoglobin: 13.8 g/dL (ref 11.7–15.5)
MCH: 29.3 pg (ref 27.0–33.0)
MCHC: 32.5 g/dL (ref 32.0–36.0)
MCV: 90.2 fL (ref 80.0–100.0)
MPV: 9.8 fL (ref 7.5–12.5)
Platelets: 273 10*3/uL (ref 140–400)
RBC: 4.71 10*6/uL (ref 3.80–5.10)
RDW: 11.8 % (ref 11.0–15.0)
WBC: 5.4 10*3/uL (ref 3.8–10.8)

## 2019-06-21 LAB — HEMOGLOBIN A1C
Hgb A1c MFr Bld: 5.5 % of total Hgb (ref <5.7)
Mean Plasma Glucose: 111 (calc)
eAG (mmol/L): 6.2 (calc)

## 2019-06-21 LAB — HEPATITIS C ANTIBODY
Hepatitis C Ab: NONREACTIVE
SIGNAL TO CUT-OFF: 0.01 (ref <1.00)

## 2019-06-21 MED ORDER — FLUTICASONE-SALMETEROL 250-50 MCG/DOSE IN AEPB: 1.0000 | INHALATION_SPRAY | Freq: Two times a day (BID) | RESPIRATORY_TRACT | 1 refills | Status: DC

## 2019-06-21 NOTE — Telephone Encounter (Signed)
Briana Silva states she forgot to mention needing a refill on Advair. Last prescribed by [facility].

## 2019-06-22 ENCOUNTER — Ambulatory Visit: Payer: Medicare Other

## 2019-06-23 NOTE — Progress Notes (Signed)
Hepatitis C results were negative.  Blood sugar, kidney function, electrolytes, and liver function look good from the labs. HDL (good cholesterol) looks great! LDL (bad cholesterol) is a little elevated. Work on decreasing saturated fats from your diet and we can hopefully get this down. Blood counts looks great. No signs of anemia. Hemoglobin A1c is down to 5.5%.  Keep up the great work!

## 2019-07-07 ENCOUNTER — Encounter: Payer: Self-pay | Admitting: Family Medicine

## 2019-07-07 ENCOUNTER — Ambulatory Visit (INDEPENDENT_AMBULATORY_CARE_PROVIDER_SITE_OTHER): Payer: Medicare Other | Admitting: Family Medicine

## 2019-07-07 VITALS — BP 137/83 | HR 115

## 2019-07-07 DIAGNOSIS — I10 Essential (primary) hypertension: Secondary | ICD-10-CM

## 2019-07-07 MED ORDER — LISINOPRIL 10 MG PO TABS
10.0000 mg | ORAL_TABLET | Freq: Every day | ORAL | 1 refills | Status: DC
Start: 2019-07-07 — End: 2019-12-23

## 2019-07-07 NOTE — Progress Notes (Signed)
Agree with documentation as above.   Briana Glazer, MD  

## 2019-07-07 NOTE — Patient Instructions (Signed)
Increase Lisinopril to 10 mg daily. New prescription sent to [facility]. You can take two 5 mg tablets daily until you fill new prescription.   Follow up in 3 weeks for repeat blood pressure check.

## 2019-07-07 NOTE — Progress Notes (Signed)
Patient comes in today for blood pressure check.   Briana Silva was started on Lisinopril 5 mg daily for blood pressure control at last visit. She denies any missed doses, side effects, headaches, chest pain, palpitations, dizziness, or shortness of breath.   Briana Silva hasn't been taking blood pressures at home.  Patient wanted to sit 5-10 minutes before I took her blood pressure reading. After sitting, her blood pressure reading today is: 137/83.  I spoke with Dr. Linford Arnold who advised to increase lisinopril to 10 mg daily. Prescription sent to the pharmacy. Patient is okay with medication increase. She will keep a record of her blood pressures at home to bring in to her next appointment. Nurse visit scheduled in 3 weeks (on vacation in 2 weeks) to recheck her blood pressure. Patient will call with any problems prior.

## 2019-07-08 ENCOUNTER — Telehealth: Payer: Self-pay

## 2019-07-08 NOTE — Telephone Encounter (Signed)
Carrianne states she really wasn't taking Lisinopril at all. She did restart the Lisinopril 2 tablets of the 5 mg tablets yesterday and this morning. She noticed a pain in her left shoulder going up the left side of her neck. I advised her to go to the urgent care to be evaluated.

## 2019-07-08 NOTE — Telephone Encounter (Signed)
I agree.  Lisinopril does not cause neck or shoulder pain.  So this would be highly unusual being that it is left-sided she needs to be evaluated.  Nani Gasser, MD

## 2019-07-28 ENCOUNTER — Ambulatory Visit: Payer: Medicare Other

## 2019-10-18 ENCOUNTER — Other Ambulatory Visit: Payer: Self-pay | Admitting: Primary Care

## 2019-10-18 DIAGNOSIS — J4541 Moderate persistent asthma with (acute) exacerbation: Secondary | ICD-10-CM

## 2019-11-03 ENCOUNTER — Encounter: Payer: Self-pay | Admitting: Primary Care

## 2019-11-03 ENCOUNTER — Telehealth: Payer: Self-pay | Admitting: Primary Care

## 2019-11-03 ENCOUNTER — Ambulatory Visit: Payer: Medicare (Managed Care) | Admitting: Primary Care

## 2019-11-03 VITALS — BP 148/86 | HR 123 | Temp 97.8°F | Ht 66.0 in | Wt 276.0 lb

## 2019-11-03 DIAGNOSIS — R000 Tachycardia, unspecified: Secondary | ICD-10-CM | POA: Diagnosis not present

## 2019-11-03 DIAGNOSIS — Z8679 Personal history of other diseases of the circulatory system: Secondary | ICD-10-CM | POA: Diagnosis not present

## 2019-11-03 DIAGNOSIS — I420 Dilated cardiomyopathy: Secondary | ICD-10-CM | POA: Diagnosis not present

## 2019-11-03 DIAGNOSIS — Z0000 Encounter for general adult medical examination without abnormal findings: Secondary | ICD-10-CM | POA: Diagnosis not present

## 2019-11-03 DIAGNOSIS — J4541 Moderate persistent asthma with (acute) exacerbation: Secondary | ICD-10-CM | POA: Diagnosis not present

## 2019-11-03 LAB — PCMH FALL RISK PLAN

## 2019-11-03 LAB — PCMH DEPRESSION ASSESSMENT

## 2019-11-03 LAB — PCMH FALL RISK ASSESSMENT

## 2019-11-03 MED ORDER — PROAIR HFA 108 (90 BASE) MCG/ACT IN AERS
2.0000 | INHALATION_SPRAY | RESPIRATORY_TRACT | 5 refills | Status: AC | PRN
Start: 2019-11-03 — End: ?

## 2019-11-03 MED ORDER — PREDNISONE 20 MG PO TABS *I*
40.0000 mg | ORAL_TABLET | Freq: Every day | ORAL | 0 refills | Status: AC
Start: 2019-11-03 — End: 2019-11-08

## 2019-11-03 MED ORDER — ALBUTEROL SULFATE (5 MG/ML) 0.5% NEBS SOLUTION *I*
2.5000 mg | INHALATION_SOLUTION | RESPIRATORY_TRACT | 5 refills | Status: AC | PRN
Start: 2019-11-03 — End: ?

## 2019-11-03 MED ORDER — FUROSEMIDE 40 MG PO TABS *I*
20.0000 mg | ORAL_TABLET | Freq: Every morning | ORAL | 3 refills | Status: AC
Start: 2019-11-03 — End: ?

## 2019-11-03 MED ORDER — FLUTICASONE-SALMETEROL 250-50 MCG/ACT IN AEPB *I*
1.0000 | INHALATION_SPRAY | Freq: Two times a day (BID) | RESPIRATORY_TRACT | 3 refills | Status: AC
Start: 2019-11-03 — End: ?

## 2019-11-03 MED ORDER — SPIRONOLACTONE 25 MG PO TABS *I*
25.0000 mg | ORAL_TABLET | Freq: Every day | ORAL | 3 refills | Status: AC
Start: 2019-11-03 — End: ?

## 2019-11-03 MED ORDER — CARVEDILOL 6.25 MG PO TABS *I*
6.2500 mg | ORAL_TABLET | Freq: Two times a day (BID) | ORAL | 3 refills | Status: AC
Start: 2019-11-03 — End: ?

## 2019-11-03 NOTE — Progress Notes (Signed)
Subjective:     Patient ID: Zakhia Seres is a 74 y.o. female.    Presents for asthma exacerbation/medication follow-up.    For the past 2 weeks, she has been experiencing increasing shortness of breath, wheezing, chest tightness/pain and palpitations.  She reports that symptoms feel similar to prior asthma exacerbation.  She has been experiencing some congestion.  She denies any known sick contacts including Covid, influenza or RSV.  She has been mostly home and not interacting with the community.  She has been taking Advair daily and only has a few doses left.  She ran out of her albuterol inhaler over a week ago.  She reports having a mild cough with occasional sputum production.    At present, she is not taking carvedilol, spironolactone or furosemide.  She has not taken any of these medications for some time.  She reports that she has trouble taking medications which she does not understand indication for them.  She reports previously having low blood pressures on carvedilol.  She has not followed up with cardiology since last year.      Patient's medications, allergies, past medical, surgical, social and family histories were reviewed and updated as appropriate.    Review of Systems   Constitutional: Negative for chills and fever.   HENT: Positive for congestion. Negative for sinus pressure, sinus pain and sneezing.    Respiratory: Positive for cough, chest tightness, shortness of breath and wheezing.    Cardiovascular: Positive for chest pain and palpitations.   Neurological: Negative for dizziness, light-headedness and headaches.           Objective:   Physical Exam  Vitals reviewed.   Constitutional:       General: She is not in acute distress.     Appearance: Normal appearance. She is obese.   Cardiovascular:      Rate and Rhythm: Regular rhythm. Tachycardia present.      Heart sounds: Normal heart sounds.   Pulmonary:      Effort: Respiratory distress present.      Breath sounds: Wheezing  present. No rhonchi or rales.   Musculoskeletal:      Right lower leg: No edema.      Left lower leg: No edema.   Neurological:      Mental Status: She is alert and oriented to person, place, and time.       EKG: Atrial tachycardia, HR ~ 114, PVC normal axis, no acute ST segment changes, similar to prior      Assessment:      Ms. Heese is a 74 year old female presenting with symptoms suggestive of asthma exacerbation.  Stressed the importance of resuming her medications for chronic diastolic heart failure.  She is not appear fluid overloaded on exam.  She is hypertensive; however, she is not taking any of her cardiac medications to lower her blood pressure.       Plan:      1.  Asthma exacerbation: Start prednisone 40 mg daily for 5 days.  Continue Advair 1 puff twice daily.  Albuterol inhaler and nebulizer refilled.  2.  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  Torsemide 20 mg daily was refilled; however, she can hold off resuming this as she is not fluid overloaded.  Emphasized the importance of carvedilol to manage her tachycardia as well as blood pressure.  Discussed the role of using spironolactone in the setting of  dilated cardiomyopathy.  Advised to follow-up with cardiology as she was supposed to have a repeat echocardiogram in June of last year.    Advised to follow-up in approximately 2 weeks to reevaluate breathing as well as tachycardia.    Delma Officer, MD  11/03/2019    *This note was dictated using Dragon [employer], reasonable efforts were made to correct for any dictation errors.

## 2019-11-03 NOTE — Patient Instructions (Signed)
Thank you for completing your Subsequent Annual Medicare Visit and Asthma   with [place] today.     The purpose of this visits was to:     Screen for disease   Assess risk of future medical problems   Help develop a healthy lifestyle   Update vaccines   Get to know your doctor in case of an illness    Patient Care Team:  Delma Officer, MD as PCP - General (Primary Care)     Medicare 5 Year Plan    The following items were identified as areas of concern during your screening today:  BMI greater than 25 - This is a risk for Heart Attack, Stroke, High Blood Pressure, Diabetes, High Cholesterol and other complications.       The Health Maintenance table below identifies screening tests and immunizations recommended by your health care team:  Health Maintenance: These screening recommendations are based on USPSTF, [employer], and [place] state guidelines   Topic Date Due    HIV Screening  Never done    Hepatitis C Screening  Never done    Osteoporosis Screening  Never done    Shingles Vaccine (1 of 2) Never done    Adult Prevnar Vaccination (1) Never done    Adult Pneumovax Vaccine (1) Never done    Flu Shot (1) 09/14/2019    DEPRESSION SCREEN YEARLY  11/02/2020    Fall Risk Screening  11/02/2020    COVID-19 Vaccine  Completed     In addition, goals and orders placed to address these recommendations are listed in the "Today's Visit" section.    We wish you the best of health and look forward to seeing you again next year for your Annual Medicare Wellness Visit.     If you have any health care concerns before then, please do not hesitate to contact us.

## 2019-11-03 NOTE — Progress Notes (Signed)
Visit performed as:       Office Visit, met with patient in person    Today we reviewed and updated [place] smoking status, activities of daily living, depression screen, fall risk, medications and allergies.   I have counseled the patient in the above areas.     Subjective:     Chief Complaint: Alicia Mcdaniel is a 74 y.o. female here for a/an Subsequent Annual Medicare Visit and Asthma    In general, Alicia Mcdaniel rates their overall health as:  poor      Patient Care Team:  Lonie Peak, MD as PCP - General (Primary Care)  Marita Kansas, MD (Cardiology)     No current outpatient medications on file prior to visit.     No current facility-administered medications on file prior to visit.     Allergies   Allergen Reactions    Environmental Allergies Other (See Comments)     Irritates asthma    Metoprolol Other (See Comments)     Severe fatigue, "tightness around my neck", severe leg weakness     Patient Active Problem List    Diagnosis Date Noted    Moderate persistent asthma with exacerbation 12/01/2017    History of congestive heart failure 12/01/2017    Obstructive sleep apnea 12/01/2017    Tachycardia 12/01/2017     Past Medical History:   Diagnosis Date    Asthma     Colonic polyp     Congestive heart failure     OSA (obstructive sleep apnea)      Past Surgical History:   Procedure Laterality Date    SALPINGO-OOPHORECTOMY Left     TUBAL LIGATION       Family History   Problem Relation Age of Onset    Hypertension Mother     Alzheimer's disease Mother     Pancreatic Cancer Father     Lung disease Father     Hypertension Father     Other Sister         Brain Tumor    Other Brother         Brain Tumor    Clotting disorder Brother     Other Daughter         Brain Tumor    Thyroid disease Daughter     Blood clots Daughter     Clotting disorder Daughter     Clotting disorder Brother      Social History     Socioeconomic History    Marital status:  Widowed     Spouse name: Not on file    Number of children: Not on file    Years of education: Not on file    Highest education level: Not on file   Occupational History    Not on file   Tobacco Use    Smoking status: Never Smoker    Smokeless tobacco: Never Used   Substance and Sexual Activity    Alcohol use: Yes    Drug use: Not Currently    Sexual activity: Not Currently   Social History Narrative    Not on file       Objective:     Vital Signs: BP 148/86    Pulse (!) 123    Temp 36.6 C (97.8 F) (Temporal)    Ht 1.676 m ($Remove'5\' 6"'xyDMZvW$ )    Wt 125.2 kg (276 lb)    SpO2 95%    BMI 44.55 kg/m    BMI:  Body mass index is 44.55 kg/m.    Vision Screening Results (Welcome visit only):  No exam data present    Depression Screening Results:  Recent Review Flowsheet Data     PHQ Scores 11/03/2019 12/01/2017    PSQ2 Q1 - Interest/Pleasure - N    PSQ2 Q2 - Down, Depressed, Hopeless - N    PHQ Calculated Score 0 -        Opioid Use/DAST- 10 Screening Results:   How many times in the past year have you used an illegal drug or used a prescription medication for nonmedical reasons?: 0 (11/03/2019  9:45 AM)    Activities of Daily Living/Functional Screening Results:  Is the person deaf or does he/she have serious difficulty hearing?: N  Is this person blind or does he/she have serious difficulty seeing even when wearing glasses?: N  *Vision Status: Visual aid   Does this person have serious difficulty walking or climbing stairs?: N  Does this person have difficulty dressing or bathing?: N  *Shopping: Independent  *House Keeping: Independent  *Managing Own Medications: Independent  *Handling Finances: Independent  Difficulty doing errands due to a physicial, mental or emotional condition: No  Difficulty remembering or making decisions due to a physicial, mental or emotional condition: No      Fall Risk Screening Results:  Have you fallen in the last year?: No  Do you feel you are at risk for falling?: No      Assessment and  Plan:     Cognitive Function:  Recall of recent and remote events appears:  Normal      Advanced Care Planning:  will discuss at subseq visit     The following health maintenance plan was reviewed with the patient:    Health Maintenance Topics with due status: Overdue       Topic Date Due    HIV Screening USPSTF/[place] Never done    Hepatitis C Screening USPSTF/[place] Never done    Osteoporosis Screening USPSTF Never done    IMM-ZOSTER Never done    IMM-PREVNAR VACCINE 65 + YRS Never done    IMM-PNEUMOVAX VACCINE 65 + YRS Never done    COVID-19 Vaccine 04/21/2019    IMM-INFLUENZA 09/14/2019     Health Maintenance Topics with due status: Not Due       Topic Last Completion Date    DEPRESSION SCREEN YEARLY 11/03/2019    Fall Risk Screening 11/03/2019     This health maintenance schedule, identified risks, a list of orders placed today and patient goals have been provided to [employer] in the after visit summary.     Plan for any concerns identified during screening or risk assessments:  N/a    Lonie Peak, MD  11/03/2019    *This note was dictated using [place], reasonable efforts were made to correct for any dictation errors.

## 2019-11-03 NOTE — Telephone Encounter (Signed)
Per the check out comments, the patient is to be scheduled in 2 weeks for "Follow-up Asthma/Tachycardia/HTN." Would you like this scheduled with you? (if so, where in your schedule) or can this be with Jenny/Kayla?   Thank you

## 2019-11-17 ENCOUNTER — Ambulatory Visit: Payer: Medicare (Managed Care) | Attending: Pulmonary Disease | Admitting: Internal Medicine

## 2019-11-17 ENCOUNTER — Encounter: Payer: Self-pay | Admitting: Internal Medicine

## 2019-11-17 ENCOUNTER — Ambulatory Visit: Payer: Medicare (Managed Care)

## 2019-11-17 VITALS — BP 118/84 | HR 108 | Temp 97.6°F | Ht 66.5 in | Wt 275.0 lb

## 2019-11-17 DIAGNOSIS — Z23 Encounter for immunization: Secondary | ICD-10-CM | POA: Diagnosis not present

## 2019-11-17 DIAGNOSIS — J4541 Moderate persistent asthma with (acute) exacerbation: Secondary | ICD-10-CM | POA: Diagnosis not present

## 2019-11-17 DIAGNOSIS — I420 Dilated cardiomyopathy: Secondary | ICD-10-CM | POA: Diagnosis not present

## 2019-11-17 NOTE — Progress Notes (Signed)
Chief Complaint   Patient presents with    Asthma    Tachycardia         HPI/Subjective:  Patient presents today for follow up, seen 2 weeks ago.      Asthma exacerbation- Took prednisone x 5 days, using Advair inhaler daily.  Has not needed albuterol or nebulizer.  Feels much better in terms of her asthma. No longer having shortness of breath.      Cardiomyopathy - found to be tachycardic last visit.  She was not taking her medications.  She was asked to resume carvedilol and spironolactone by Dr. Dan Humphreys and to follow up with her cardiologist, who she has not seen since March 2020.    She did resume her medications, but reduced carvedilol to once per day because she felt sluggish. States she felt more energetic when only taking once daily.  She did take her lasix yesterday due to some leg swelling and feels this helped a lot.  She has not reached out to her cardiologist for follow up.  States she is leaving for [place] in 10-14 days and wants to follow up with her cardiologist there.  She also has a primary care physician in [place].  She won't return to [place] until April or May. She is resistant to further medication management or follow up     Clinically relevant ROS as documented above in HPI.      Medications reviewed in eRecord today and confirmed with the patient with No changes being made  Current Outpatient Medications   Medication Sig Dispense Refill    albuterol (ALBUTEROL SULFATE) 0.5% (5 mg/mL) nebulizer solution Inhale 0.5 mLs (2.5 mg total) by nebulization every 4 hours as needed for Wheezing or Shortness of Breath 20 each 5    PROAIR HFA 108 (90 Base) MCG/ACT inhaler Inhale 2 puffs into the lungs every 4-6 hours as needed for Wheezing or Shortness of Breath  Shake well before each use. 18 g 5    spironolactone (ALDACTONE) 25 mg tablet Take 1 tablet (25 mg total) by mouth daily 90 tablet 3    fluticasone-salmeterol (ADVAIR/WIXELA) 250-50 MCG/DOSE diskus inhaler Inhale 1 puff into the lungs 2 times  daily 180 each 3    furosemide (LASIX) 40 mg tablet Take 0.5 tablets (20 mg total) by mouth every morning 45 tablet 3    carvedilol (COREG) 6.25 mg tablet Take 1 tablet (6.25 mg total) by mouth 2 times daily  with food 180 tablet 3     No current facility-administered medications for this visit.        Allergies   Allergen Reactions    Environmental Allergies Other (See Comments)     Irritates asthma    Metoprolol Other (See Comments)     Severe fatigue, "tightness around my neck", severe leg weakness       Objective:  BP 118/84    Pulse 108    Temp 36.4 C (97.6 F) (Temporal)    Ht 1.689 m (5' 6.5")    Wt 124.7 kg (275 lb)    SpO2 96% Comment: amb r/a   BMI 43.72 kg/m     General - Alert and oriented x 3, well developed, well nourished, appears in no acute distress  HEENT - Normocephalic, atraumatic, no cervical or supraclavicular lymphadenopathy, pupils equally round and reactive to light, mask in place.   Pulmonary - Respirations regular, Lungs clear to auscultation, no rales, wheezes, or rhonchi    Cardiac - Heart sounds regular,  S1&S2 without murmur, rub or gallop. Apical heart rate = 106  Lower extremities - trace to 1+ edema bilateral ankles   Musculoskeletal - Ambulates with steady gait   Abdomen - Obses  Integument - Skin intact without rash    Assessment/Plan:  Asthma - Much better after course of prednisone.  Continue Advair inhaler daily and albuterol inhaler/nebulizer as needed.      Cardiomyopathy with atrial tachycardia - Stressed the importance of compliance with medications and follow up with cardiology to avoid hospitalization for heart failure.  She will increase carvedilol back to twice per day.  Her heart rate is a little better today then last office visit due to addition of medications. Continue spironolactone.  She resumed 20 mg lasix yesterday. I asked her to repeat a BMP in 7-10 days to check electrolytes and renal function.  She does not want to follow up with her cardiologist in  [place] and refuses to do this.  She states she will call her cardiologist in [place] for appointment when she arrives.  She verbalizes understanding of importance.     High dose Flu vaccine given today     Call for concerns  Follow up in the spring when she returns back to [place].      Fredderick Erb, NP

## 2019-11-17 NOTE — Patient Instructions (Addendum)
Increase coreg to twice per day     You need follow up with your cardiologist, either here in [place] before you leave or as soon as you get to [place]     Continue the same spironolactone   Continue lasix 20 mg daily     Check blood test to monitor renal function and electrolytes in the next 7-10 days (before you leave)

## 2019-12-21 DIAGNOSIS — M79631 Pain in right forearm: Secondary | ICD-10-CM | POA: Diagnosis not present

## 2019-12-21 DIAGNOSIS — M25521 Pain in right elbow: Secondary | ICD-10-CM | POA: Diagnosis not present

## 2019-12-21 DIAGNOSIS — J984 Other disorders of lung: Secondary | ICD-10-CM | POA: Diagnosis not present

## 2019-12-21 DIAGNOSIS — R6889 Other general symptoms and signs: Secondary | ICD-10-CM | POA: Diagnosis not present

## 2019-12-21 DIAGNOSIS — S0083XA Contusion of other part of head, initial encounter: Secondary | ICD-10-CM | POA: Diagnosis not present

## 2019-12-21 DIAGNOSIS — R918 Other nonspecific abnormal finding of lung field: Secondary | ICD-10-CM | POA: Diagnosis not present

## 2019-12-21 DIAGNOSIS — Z79899 Other long term (current) drug therapy: Secondary | ICD-10-CM | POA: Diagnosis not present

## 2019-12-21 DIAGNOSIS — I509 Heart failure, unspecified: Secondary | ICD-10-CM | POA: Diagnosis not present

## 2019-12-21 DIAGNOSIS — Z7982 Long term (current) use of aspirin: Secondary | ICD-10-CM | POA: Diagnosis not present

## 2019-12-21 DIAGNOSIS — R079 Chest pain, unspecified: Secondary | ICD-10-CM | POA: Diagnosis not present

## 2019-12-21 DIAGNOSIS — S0990XA Unspecified injury of head, initial encounter: Secondary | ICD-10-CM | POA: Diagnosis not present

## 2019-12-21 DIAGNOSIS — Z743 Need for continuous supervision: Secondary | ICD-10-CM | POA: Diagnosis not present

## 2019-12-21 DIAGNOSIS — S0081XA Abrasion of other part of head, initial encounter: Secondary | ICD-10-CM | POA: Diagnosis not present

## 2019-12-21 DIAGNOSIS — Z041 Encounter for examination and observation following transport accident: Secondary | ICD-10-CM | POA: Diagnosis not present

## 2019-12-23 ENCOUNTER — Encounter: Payer: Self-pay | Admitting: Family Medicine

## 2019-12-23 ENCOUNTER — Ambulatory Visit (INDEPENDENT_AMBULATORY_CARE_PROVIDER_SITE_OTHER): Payer: Medicare Other | Admitting: Family Medicine

## 2019-12-23 ENCOUNTER — Other Ambulatory Visit: Payer: Self-pay

## 2019-12-23 VITALS — BP 135/93 | HR 114 | Ht 67.0 in | Wt 274.0 lb

## 2019-12-23 DIAGNOSIS — S0591XA Unspecified injury of right eye and orbit, initial encounter: Secondary | ICD-10-CM | POA: Diagnosis not present

## 2019-12-23 DIAGNOSIS — J349 Unspecified disorder of nose and nasal sinuses: Secondary | ICD-10-CM

## 2019-12-23 DIAGNOSIS — R0781 Pleurodynia: Secondary | ICD-10-CM | POA: Diagnosis not present

## 2019-12-23 DIAGNOSIS — M25531 Pain in right wrist: Secondary | ICD-10-CM | POA: Diagnosis not present

## 2019-12-23 DIAGNOSIS — S0081XA Abrasion of other part of head, initial encounter: Secondary | ICD-10-CM

## 2019-12-23 MED ORDER — AMOXICILLIN-POT CLAVULANATE 875-125 MG PO TABS: 1.0000 | ORAL_TABLET | Freq: Two times a day (BID) | ORAL | 0 refills | Status: DC

## 2019-12-23 MED ORDER — AMBULATORY NON FORMULARY MEDICATION: 0 refills | Status: DC

## 2019-12-23 NOTE — Patient Instructions (Signed)
Apply Vaseline 2-3 times a day to the open wounds on your head.  Try to ice the area on your face for about 4 to 5 minutes multiple times a day for about the next 4 days.  Okay to try heat or ice to the rib area as well as her right wrist to see whichever feels better.  Would probably recommend starting with ice for the wrist first.
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
## 2019-12-28 DIAGNOSIS — H2513 Age-related nuclear cataract, bilateral: Secondary | ICD-10-CM | POA: Diagnosis not present

## 2020-01-02 ENCOUNTER — Ambulatory Visit (INDEPENDENT_AMBULATORY_CARE_PROVIDER_SITE_OTHER): Payer: Medicare Other | Admitting: Family Medicine

## 2020-01-02 ENCOUNTER — Other Ambulatory Visit: Payer: Self-pay

## 2020-01-02 ENCOUNTER — Encounter: Payer: Self-pay | Admitting: Family Medicine

## 2020-01-02 VITALS — BP 130/89 | HR 105 | Ht 67.0 in | Wt 270.0 lb

## 2020-01-02 DIAGNOSIS — S0591XA Unspecified injury of right eye and orbit, initial encounter: Secondary | ICD-10-CM | POA: Diagnosis not present

## 2020-01-02 DIAGNOSIS — J349 Unspecified disorder of nose and nasal sinuses: Secondary | ICD-10-CM

## 2020-01-02 DIAGNOSIS — M25531 Pain in right wrist: Secondary | ICD-10-CM

## 2020-01-02 MED ORDER — DOXYCYCLINE HYCLATE 100 MG PO TABS: 100.0000 mg | ORAL_TABLET | Freq: Two times a day (BID) | ORAL | 0 refills | Status: DC
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
## 2020-03-14 DIAGNOSIS — J90 Pleural effusion, not elsewhere classified: Secondary | ICD-10-CM | POA: Diagnosis not present

## 2020-03-14 DIAGNOSIS — R0600 Dyspnea, unspecified: Secondary | ICD-10-CM | POA: Insufficient documentation

## 2020-03-14 DIAGNOSIS — I10 Essential (primary) hypertension: Secondary | ICD-10-CM | POA: Diagnosis not present

## 2020-03-14 DIAGNOSIS — Z79899 Other long term (current) drug therapy: Secondary | ICD-10-CM | POA: Diagnosis not present

## 2020-03-14 DIAGNOSIS — R0602 Shortness of breath: Secondary | ICD-10-CM | POA: Diagnosis not present

## 2020-03-14 DIAGNOSIS — Z20822 Contact with and (suspected) exposure to covid-19: Secondary | ICD-10-CM | POA: Diagnosis not present

## 2020-03-14 DIAGNOSIS — R0989 Other specified symptoms and signs involving the circulatory and respiratory systems: Secondary | ICD-10-CM | POA: Diagnosis not present

## 2020-03-14 DIAGNOSIS — I5023 Acute on chronic systolic (congestive) heart failure: Secondary | ICD-10-CM | POA: Diagnosis not present

## 2020-03-14 DIAGNOSIS — I110 Hypertensive heart disease with heart failure: Secondary | ICD-10-CM | POA: Diagnosis not present

## 2020-03-14 DIAGNOSIS — R7989 Other specified abnormal findings of blood chemistry: Secondary | ICD-10-CM | POA: Diagnosis not present

## 2020-03-14 DIAGNOSIS — R600 Localized edema: Secondary | ICD-10-CM | POA: Diagnosis not present

## 2020-03-14 DIAGNOSIS — G4733 Obstructive sleep apnea (adult) (pediatric): Secondary | ICD-10-CM | POA: Diagnosis not present

## 2020-03-14 DIAGNOSIS — R000 Tachycardia, unspecified: Secondary | ICD-10-CM | POA: Diagnosis not present

## 2020-03-14 DIAGNOSIS — J4530 Mild persistent asthma, uncomplicated: Secondary | ICD-10-CM | POA: Diagnosis not present

## 2020-03-14 DIAGNOSIS — I517 Cardiomegaly: Secondary | ICD-10-CM | POA: Diagnosis not present

## 2020-03-14 DIAGNOSIS — I5033 Acute on chronic diastolic (congestive) heart failure: Secondary | ICD-10-CM | POA: Diagnosis not present

## 2020-03-14 DIAGNOSIS — I5043 Acute on chronic combined systolic (congestive) and diastolic (congestive) heart failure: Secondary | ICD-10-CM | POA: Diagnosis not present

## 2020-03-14 DIAGNOSIS — Z888 Allergy status to other drugs, medicaments and biological substances status: Secondary | ICD-10-CM | POA: Diagnosis not present

## 2020-03-14 DIAGNOSIS — Z7982 Long term (current) use of aspirin: Secondary | ICD-10-CM | POA: Diagnosis not present

## 2020-03-15 DIAGNOSIS — I5043 Acute on chronic combined systolic (congestive) and diastolic (congestive) heart failure: Secondary | ICD-10-CM | POA: Diagnosis not present

## 2020-03-15 DIAGNOSIS — I081 Rheumatic disorders of both mitral and tricuspid valves: Secondary | ICD-10-CM | POA: Diagnosis not present

## 2020-03-15 DIAGNOSIS — I517 Cardiomegaly: Secondary | ICD-10-CM | POA: Diagnosis not present

## 2020-03-15 DIAGNOSIS — G4733 Obstructive sleep apnea (adult) (pediatric): Secondary | ICD-10-CM | POA: Diagnosis not present

## 2020-03-15 DIAGNOSIS — I5189 Other ill-defined heart diseases: Secondary | ICD-10-CM | POA: Diagnosis not present

## 2020-03-15 DIAGNOSIS — I10 Essential (primary) hypertension: Secondary | ICD-10-CM | POA: Diagnosis not present

## 2020-03-15 DIAGNOSIS — R0989 Other specified symptoms and signs involving the circulatory and respiratory systems: Secondary | ICD-10-CM | POA: Diagnosis not present

## 2020-03-15 DIAGNOSIS — R7989 Other specified abnormal findings of blood chemistry: Secondary | ICD-10-CM | POA: Diagnosis not present

## 2020-03-15 DIAGNOSIS — J4530 Mild persistent asthma, uncomplicated: Secondary | ICD-10-CM | POA: Diagnosis not present

## 2020-03-15 DIAGNOSIS — R0602 Shortness of breath: Secondary | ICD-10-CM | POA: Diagnosis not present

## 2020-03-16 DIAGNOSIS — I5043 Acute on chronic combined systolic (congestive) and diastolic (congestive) heart failure: Secondary | ICD-10-CM | POA: Diagnosis not present

## 2020-03-16 DIAGNOSIS — G4733 Obstructive sleep apnea (adult) (pediatric): Secondary | ICD-10-CM | POA: Diagnosis not present

## 2020-03-16 DIAGNOSIS — I10 Essential (primary) hypertension: Secondary | ICD-10-CM | POA: Diagnosis not present

## 2020-03-16 DIAGNOSIS — J4530 Mild persistent asthma, uncomplicated: Secondary | ICD-10-CM | POA: Diagnosis not present

## 2020-03-17 DIAGNOSIS — I5043 Acute on chronic combined systolic (congestive) and diastolic (congestive) heart failure: Secondary | ICD-10-CM | POA: Diagnosis not present

## 2020-03-17 DIAGNOSIS — I10 Essential (primary) hypertension: Secondary | ICD-10-CM | POA: Diagnosis not present

## 2020-03-17 DIAGNOSIS — G4733 Obstructive sleep apnea (adult) (pediatric): Secondary | ICD-10-CM | POA: Diagnosis not present

## 2020-03-17 DIAGNOSIS — J4530 Mild persistent asthma, uncomplicated: Secondary | ICD-10-CM | POA: Diagnosis not present

## 2020-03-22 ENCOUNTER — Other Ambulatory Visit: Payer: Self-pay

## 2020-03-22 ENCOUNTER — Ambulatory Visit (INDEPENDENT_AMBULATORY_CARE_PROVIDER_SITE_OTHER): Payer: Medicare Other | Admitting: Family Medicine

## 2020-03-22 ENCOUNTER — Encounter: Payer: Self-pay | Admitting: Family Medicine

## 2020-03-22 VITALS — BP 126/80 | HR 104 | Ht 67.0 in | Wt 268.0 lb

## 2020-03-22 DIAGNOSIS — H8112 Benign paroxysmal vertigo, left ear: Secondary | ICD-10-CM | POA: Diagnosis not present

## 2020-03-22 DIAGNOSIS — I10 Essential (primary) hypertension: Secondary | ICD-10-CM

## 2020-03-22 DIAGNOSIS — R531 Weakness: Secondary | ICD-10-CM | POA: Diagnosis not present

## 2020-03-22 DIAGNOSIS — I5032 Chronic diastolic (congestive) heart failure: Secondary | ICD-10-CM

## 2020-03-22 DIAGNOSIS — I5033 Acute on chronic diastolic (congestive) heart failure: Secondary | ICD-10-CM

## 2020-03-22 NOTE — Progress Notes (Signed)
Established Patient Office Visit  Subjective:  Patient ID: Briana Silva, female    DOB: 03/26/1945  Age: 75 y.o. MRN: [id number]  CC:  Chief Complaint  Patient presents with  . Hospitalization Follow-up    CHF    HPI HALLEY SHEPHEARD presents for scheduled hospital follow-up.   She says her symptoms initially started because she had food poisoning so she had vomiting and diarrhea for about 2 days and then for about 2 to 3 weeks after that she felt dizzy.  She said she when she returned she would feel like things were moving or spinning so she had actually held her diuretic when she first became sick.  But then started to feel more short of breath and have some lower leg edema she ended up going to the emergency department on March 2.  She was given Lasix 60 mg IV for pulmonary edema on her chest x-ray.  Her pro BNP at that time was greater than 6000. He has a history of combined systolic and diastolic heart failure.  Last echo was March 15, 2020 with an EF of 47 to 50%, mild global hypokinesis and restrictive filling pattern.  She was discharged home on oral Lasix 40 mg daily her spironolactone was discontinued and she was started on lisinopril 2.5 mg.  She continued her Coreg 6.25 mg.  Since being home she still just feels very dizzy especially with any kind of movement or activity.  She also just feels weak she wonders if her electrolytes could be off.  She denies any cramping in the extremities.  She does feel like she has diuresed well.    Denies any ear pain or pressure.  No recent upper respiratory symptoms.   Past Medical History:  Diagnosis Date  . Abnormal echocardiogram 09/18/2016   Mild LVH, EF 55-65%, moderately dilated LA and RA  . Allergy   . Asthma   . CHF (congestive heart failure) (HCC)   . Depression   . Hyperlipidemia   . Hypertension   . Obesity     Past Surgical History:  Procedure Laterality Date  . LEFT OOPHORECTOMY  1967  . TUBAL LIGATION  1967     Family History  Problem Relation Age of Onset  . Hypertension Mother   . Alzheimer's disease Mother   . Pancreatic cancer Father   . Hypertension Father   . Diabetes Brother   . Cancer Daughter 61       Brain tumor  . Non-Hodgkin's lymphoma Brother   . Leukemia Brother   . Other Sister        brain tumor    Social History   Socioeconomic History  . Marital status: Widowed    Spouse name: Not on file  . Number of children: 2  . Years of education: Not on file  . Highest education level: Not on file  Occupational History  . Occupation: Retired.   Tobacco Use  . Smoking status: Never Smoker  . Smokeless tobacco: Never Used  Substance and Sexual Activity  . Alcohol use: No  . Drug use: No  . Sexual activity: Not on file  Other Topics Concern  . Not on file  Social History Narrative   No regular exercise.  Going to a nutrition center.    Social Determinants of Health   Financial Resource Strain: Not on file  Food Insecurity: Not on file  Transportation Needs: Not on file  Physical Activity: Not on file  Stress: Not  on file  Social Connections: Not on file  Intimate Partner Violence: Not on file    Outpatient Medications Prior to Visit  Medication Sig Dispense Refill  . albuterol (PROAIR HFA) 108 (90 Base) MCG/ACT inhaler Inhale 2 puffs into the lungs every 4 (four) hours as needed for wheezing or shortness of breath. 6.7 g 5  . albuterol (PROVENTIL) (2.5 MG/3ML) 0.083% nebulizer solution Take 3 mLs (2.5 mg total) by nebulization every 4 (four) hours as needed for wheezing or shortness of breath. Dx:J45.41 75 mL 6  . albuterol (PROVENTIL) (5 MG/ML) 0.5% nebulizer solution Inhale into the lungs.    . carvedilol (COREG) 6.25 MG tablet Take 6.25 mg by mouth 2 (two) times daily.    . cetirizine (ZYRTEC) 10 MG tablet Take 1 tablet (10 mg total) by mouth daily as needed for allergies or rhinitis. 30 tablet 5  . fluticasone (FLONASE) 50 MCG/ACT nasal spray Place 2  sprays into both nostrils daily. 16 g 6  . Fluticasone-Salmeterol (ADVAIR DISKUS) 250-50 MCG/DOSE AEPB Inhale 1 puff into the lungs 2 (two) times daily. 180 each 1  . furosemide (LASIX) 40 MG tablet Take 1 tablet (40 mg total) by mouth daily. 90 tablet 1  . ipratropium (ATROVENT) 0.03 % nasal spray Place 2 sprays into both nostrils 3 (three) times daily as needed for rhinitis (nasal congestion). 30 mL 0  . ipratropium-albuterol (DUONEB) 0.5-2.5 (3) MG/3ML SOLN Take 3 mLs by nebulization every 2 (two) hours as needed. 360 mL 0  . lisinopril (ZESTRIL) 2.5 MG tablet Take 2.5 mg by mouth daily.    . Omega-3 Fatty Acids (FISH OIL) 1000 MG CAPS Take 1 capsule by mouth daily.    . AMBULATORY NON FORMULARY MEDICATION Medication Name: rollator 1 each 0  . AMBULATORY NON FORMULARY MEDICATION Medication Name: Bedside commode 1 each 0  . doxycycline (VIBRA-TABS) 100 MG tablet Take 1 tablet (100 mg total) by mouth 2 (two) times daily. 20 tablet 0  . spironolactone (ALDACTONE) 25 MG tablet Take 25 mg by mouth daily.     No facility-administered medications prior to visit.    Allergies  Allergen Reactions  . Metoprolol Swelling    Other reaction(s): Other (See Comments) Severe fatigue, "tightness around my neck", severe leg weakness     ROS Review of Systems    Objective:    Physical Exam Constitutional:      Appearance: She is well-developed.  HENT:     Head: Normocephalic and atraumatic.     Right Ear: Tympanic membrane and ear canal normal.     Left Ear: Tympanic membrane and ear canal normal.     Nose: Nose normal.  Cardiovascular:     Rate and Rhythm: Normal rate and regular rhythm.     Heart sounds: Normal heart sounds.  Pulmonary:     Effort: Pulmonary effort is normal.     Breath sounds: Normal breath sounds.  Skin:    General: Skin is warm and dry.  Neurological:     Mental Status: She is alert and oriented to person, place, and time.     Comments: She did have some mild  nystagmus with Dix-Hallpike maneuver to the right but going to the left really intensely recreated her symptoms.  Psychiatric:        Behavior: Behavior normal.     BP 126/80   Pulse (!) 104   Ht 5\' 7"  (1.702 m)   Wt 268 lb (121.6 kg)   SpO2 98%  BMI 41.97 kg/m  Wt Readings from Last 3 Encounters:  03/22/20 268 lb (121.6 kg)  01/02/20 270 lb (122.5 kg)  12/23/19 274 lb (124.3 kg)     There are no preventive care reminders to display for this patient.  There are no preventive care reminders to display for this patient.  Lab Results  Component Value Date   TSH 1.518 01/17/2015   Lab Results  Component Value Date   WBC 5.4 06/20/2019   HGB 13.8 06/20/2019   HCT 42.5 06/20/2019   MCV 90.2 06/20/2019   PLT 273 06/20/2019   Lab Results  Component Value Date   NA 139 06/20/2019   K 5.0 06/20/2019   CO2 30 06/20/2019   GLUCOSE 96 06/20/2019   BUN 16 06/20/2019   CREATININE 0.82 06/20/2019   BILITOT 0.7 06/20/2019   ALKPHOS 76 06/25/2015   AST 25 06/20/2019   ALT 16 06/20/2019   PROT 7.4 06/20/2019   ALBUMIN 3.8 06/25/2015   CALCIUM 9.6 06/20/2019   Lab Results  Component Value Date   CHOL 233 (H) 06/20/2019   Lab Results  Component Value Date   HDL 81 06/20/2019   Lab Results  Component Value Date   LDLCALC 137 (H) 06/20/2019   Lab Results  Component Value Date   TRIG 48 06/20/2019   Lab Results  Component Value Date   CHOLHDL 2.9 06/20/2019   Lab Results  Component Value Date   HGBA1C 5.5 06/20/2019      Assessment & Plan:   Problem List Items Addressed This Visit      Cardiovascular and Mediastinum   Essential hypertension    Well controlled. Continue current regimen. Follow up in  6 mo       Relevant Medications   lisinopril (ZESTRIL) 2.5 MG tablet   Congestive heart failure (HCC) - Primary   Relevant Medications   lisinopril (ZESTRIL) 2.5 MG tablet   Other Relevant Orders   Ambulatory referral to Cardiology   COMPLETE  METABOLIC PANEL WITH GFR   Magnesium   Acute on chronic diastolic (congestive) heart failure (HCC)    I do feel like she is out of the acute failure.  She still has some trace pitting edema in both lower legs but she feels like it is back to baseline and her weight looks stable today.  Need to monitor weights.  Tolerating low-dose ACE inhibitor so far.  She can let me know when she is due for refill.  They did take her off the spironolactone so would like to check her potassium levels she is still feeling very weak and tired.      Relevant Medications   lisinopril (ZESTRIL) 2.5 MG tablet    Other Visit Diagnoses    BPPV (benign paroxysmal positional vertigo), left       Weakness         Sxs consistent with BPPV, worse on the left though she did have some mild symptoms to the right as well.  We discussed home exercises versus formal vestibular rehab she is can to try to do the home exercises through the weekend and if she is not improving she will give me a call back.  No orders of the defined types were placed in this encounter.   Follow-up: No follow-ups on file.    Nani Gasser, MD

## 2020-03-22 NOTE — Patient Instructions (Signed)
Please call us back on Monday if the vertigo is not getting better. Please weigh yourself daily so that you can keep an eye on volume overload.  If your weight goes up more than 2 pounds then we may need to adjust your diuretic or at least temporarily give you an extra dose.

## 2020-03-22 NOTE — Assessment & Plan Note (Addendum)
I do feel like she is out of the acute failure.  She still has some trace pitting edema in both lower legs but she feels like it is back to baseline and her weight looks stable today.  Need to monitor weights.  Tolerating low-dose ACE inhibitor so far.  She can let me know when she is due for refill.  They did take her off the spironolactone so would like to check her potassium levels she is still feeling very weak and tired.

## 2020-03-22 NOTE — Assessment & Plan Note (Signed)
Well controlled. Continue current regimen. Follow up in  6 mo  

## 2020-03-23 LAB — COMPLETE METABOLIC PANEL WITH GFR
AG Ratio: 1.1 (calc) (ref 1.0–2.5)
ALT: 24 U/L (ref 6–29)
AST: 20 U/L (ref 10–35)
Albumin: 3.9 g/dL (ref 3.6–5.1)
Alkaline phosphatase (APISO): 93 U/L (ref 37–153)
BUN: 16 mg/dL (ref 7–25)
CO2: 32 mmol/L (ref 20–32)
Calcium: 9.9 mg/dL (ref 8.6–10.4)
Chloride: 100 mmol/L (ref 98–110)
Creat: 0.87 mg/dL (ref 0.60–0.93)
GFR, Est African American: 75 mL/min/{1.73_m2} (ref >=60)
GFR, Est Non African American: 65 mL/min/{1.73_m2} (ref >=60)
Globulin: 3.4 g/dL (calc) (ref 1.9–3.7)
Glucose, Bld: 105 mg/dL — ABNORMAL HIGH (ref 65–99)
Potassium: 4.8 mmol/L (ref 3.5–5.3)
Sodium: 141 mmol/L (ref 135–146)
Total Bilirubin: 0.8 mg/dL (ref 0.2–1.2)
Total Protein: 7.3 g/dL (ref 6.1–8.1)

## 2020-03-23 LAB — MAGNESIUM: Magnesium: 2.4 mg/dL (ref 1.5–2.5)

## 2020-03-23 NOTE — Progress Notes (Signed)
All labs are normal. 

## 2020-05-01 NOTE — Progress Notes (Deleted)
Referring-Briana Metheney MD Reason for referral-CHF  HPI: 75 year old female for evaluation of chronic diastolic congestive heart failure at request of Nani Gasser MD.  Patient seen previously but not since February 2017.  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  Patient previously followed in [place] and felt possible tachycardia mediated LV dysfunction.  This was treated with carvedilol with some improvement.  Echocardiogram March 2022 at [facility] interpreted as mild LV dysfunction with ejection fraction 47 to 50%, restrictive filling, mild biatrial enlargement, mild mitral regurgitation, mild tricuspid regurgitation, mild pulmonary hypertension.  CTA March 2022 showed vascular congestion, no pulmonary embolus and mediastinal adenopathy and follow-up recommended.  Current Outpatient Medications  Medication Sig Dispense Refill  . albuterol (PROAIR HFA) 108 (90 Base) MCG/ACT inhaler Inhale 2 puffs into the lungs every 4 (four) hours as needed for wheezing or shortness of breath. 6.7 g 5  . albuterol (PROVENTIL) (2.5 MG/3ML) 0.083% nebulizer solution Take 3 mLs (2.5 mg total) by nebulization every 4 (four) hours as needed for wheezing or shortness of breath. Dx:J45.41 75 mL 6  . albuterol (PROVENTIL) (5 MG/ML) 0.5% nebulizer solution Inhale into the lungs.    . carvedilol (COREG) 6.25 MG tablet Take 6.25 mg by mouth 2 (two) times daily.    . cetirizine (ZYRTEC) 10 MG tablet Take 1 tablet (10 mg total) by mouth daily as needed for allergies or rhinitis. 30 tablet 5  . fluticasone (FLONASE) 50 MCG/ACT nasal spray Place 2 sprays into both nostrils daily. 16 g 6  . Fluticasone-Salmeterol (ADVAIR DISKUS) 250-50 MCG/DOSE AEPB Inhale 1 puff into the lungs 2 (two) times daily. 180 each 1  . furosemide (LASIX) 40 MG tablet Take 1 tablet  (40 mg total) by mouth daily. 90 tablet 1  . ipratropium (ATROVENT) 0.03 % nasal spray Place 2 sprays into both nostrils 3 (three) times daily as needed for rhinitis (nasal congestion). 30 mL 0  . ipratropium-albuterol (DUONEB) 0.5-2.5 (3) MG/3ML SOLN Take 3 mLs by nebulization every 2 (two) hours as needed. 360 mL 0  . lisinopril (ZESTRIL) 2.5 MG tablet Take 2.5 mg by mouth daily.    . Omega-3 Fatty Acids (FISH OIL) 1000 MG CAPS Take 1 capsule by mouth daily.     No current facility-administered medications for this visit.    Allergies  Allergen Reactions  . Metoprolol Swelling    Other reaction(s): Other (See Comments) Severe fatigue, "tightness around my neck", severe leg weakness     Past Medical History:  Diagnosis Date  . Abnormal echocardiogram 09/18/2016   Mild LVH, EF 55-65%, moderately dilated LA and RA  . Allergy   . Asthma   . CHF (congestive heart failure) (HCC)   . Depression   . Hyperlipidemia   . Hypertension   . Obesity     Past Surgical History:  Procedure Laterality Date  . LEFT OOPHORECTOMY  1967  . TUBAL LIGATION  1967    Social History   Socioeconomic History  . Marital status: Widowed    Spouse name: Not on file  . Number of children: 2  . Years of education: Not on file  . Highest education level: Not on file  Occupational History  . Occupation: Retired.   Tobacco Use  . Smoking status: Never Smoker  . Smokeless tobacco: Never Used  Substance and Sexual Activity  . Alcohol use: No  .  Drug use: No  . Sexual activity: Not on file  Other Topics Concern  . Not on file  Social History Narrative   No regular exercise.  Going to a nutrition center.    Social Determinants of Health   Financial Resource Strain: Not on file  Food Insecurity: Not on file  Transportation Needs: Not on file  Physical Activity: Not on file  Stress: Not on file  Social Connections: Not on file  Intimate Partner Violence: Not on file    Family History   Problem Relation Age of Onset  . Hypertension Mother   . Alzheimer's disease Mother   . Pancreatic cancer Father   . Hypertension Father   . Diabetes Brother   . Cancer Daughter 61       Brain tumor  . Non-Hodgkin's lymphoma Brother   . Leukemia Brother   . Other Sister        brain tumor    ROS: no fevers or chills, productive cough, hemoptysis, dysphasia, odynophagia, melena, hematochezia, dysuria, hematuria, rash, seizure activity, orthopnea, PND, pedal edema, claudication. Remaining systems are negative.  Physical Exam:   There were no vitals taken for this visit.  General:  Well developed/well nourished in NAD Skin warm/dry Patient not depressed No peripheral clubbing Back-normal HEENT-normal/normal eyelids Neck supple/normal carotid upstroke bilaterally; no bruits; no JVD; no thyromegaly chest - CTA/ normal expansion CV - RRR/normal S1 and S2; no murmurs, rubs or gallops;  PMI nondisplaced Abdomen -NT/ND, no HSM, no mass, + bowel sounds, no bruit 2+ femoral pulses, no bruits Ext-no edema, chords, 2+ DP Neuro-grossly nonfocal  ECG - personally reviewed  A/P  1 chronic combined systolic/diastolic congestive heart failure-  2 history of atrial tachycardia-this was previously felt to cause tachycardia which contributed to her cardiomyopathy.  We will continue beta-blocker at present dose.  3 hypertension-  4 hyperlipidemia-  [name], MD

## 2020-05-08 ENCOUNTER — Other Ambulatory Visit: Payer: Self-pay | Admitting: Primary Care

## 2020-05-08 DIAGNOSIS — I420 Dilated cardiomyopathy: Secondary | ICD-10-CM

## 2020-05-08 DIAGNOSIS — G4733 Obstructive sleep apnea (adult) (pediatric): Secondary | ICD-10-CM

## 2020-05-09 ENCOUNTER — Ambulatory Visit: Payer: Medicare Other | Admitting: Cardiology

## 2020-05-10 ENCOUNTER — Other Ambulatory Visit: Payer: Self-pay | Admitting: *Deleted

## 2020-05-10 MED ORDER — CARVEDILOL 6.25 MG PO TABS: 6.2500 mg | ORAL_TABLET | Freq: Two times a day (BID) | ORAL | 1 refills | Status: DC

## 2020-05-18 ENCOUNTER — Ambulatory Visit: Payer: Medicare (Managed Care) | Admitting: Primary Care

## 2020-05-22 ENCOUNTER — Encounter: Payer: Self-pay | Admitting: Primary Care

## 2020-06-01 ENCOUNTER — Telehealth: Payer: Self-pay | Admitting: Family Medicine

## 2020-06-01 NOTE — Progress Notes (Signed)
  Chronic Care Management   Outreach Note  06/01/2020 Name: CATRINA FELLENZ MRN: [id number] DOB: 09-Jul-1945  Referred by: Agapito Games, MD Reason for referral : No chief complaint on file.   An unsuccessful telephone outreach was attempted today. The patient was referred to the pharmacist for assistance with care management and care coordination.   Follow Up Plan:   Carmell [place] Upstream Scheduler

## 2020-06-19 ENCOUNTER — Telehealth: Payer: Self-pay | Admitting: Family Medicine

## 2020-06-19 NOTE — Chronic Care Management (AMB) (Signed)
  Chronic Care Management   Note  06/19/2020 Name: ASLIN FARINAS MRN: [id number] DOB: [birth year]  LIZVETTE LIGHTSEY is a 75 y.o. year old female who is a primary care patient of Metheney, Barbarann Ehlers, MD. I reached out to Germaine Pomfret by phone today in response to a referral sent by Ms. Wille Glaser Ekdahl's PCP, Agapito Games, MD.   Ms. Frysinger was given information about Chronic Care Management services today including:  1. CCM service includes personalized support from designated clinical staff supervised by her physician, including individualized plan of care and coordination with other care providers 2. 24/7 contact phone numbers for assistance for urgent and routine care needs. 3. Service will only be billed when office clinical staff spend 20 minutes or more in a month to coordinate care. 4. Only one practitioner may furnish and bill the service in a calendar month. 5. The patient may stop CCM services at any time (effective at the end of the month) by phone call to the office staff.   Patient agreed to services and verbal consent obtained.   Follow up plan:   Carmell Austria Upstream Scheduler

## 2020-07-26 ENCOUNTER — Telehealth: Payer: Self-pay | Admitting: Pharmacist

## 2020-07-26 NOTE — Chronic Care Management (AMB) (Signed)
    Chronic Care Management Pharmacy Assistant   Name: Briana Silva  MRN: [id number] DOB: 07-24-1945  Briana Silva is an 75 y.o. year old female who presents for his initial CCM visit with the clinical pharmacist.  Reason for Encounter: Initial CCM Visit  Recent office visits:  03/22/20- Nani Gasser, MD- seen for hospitalization follow up, referral to cardiology, no medication changes, follow up 6 months   Recent consult visits:  No visits noted   Hospital visits:  Medication Reconciliation was completed by comparing discharge summary, patient's EMR and Pharmacy list, and upon discussion with patient.  Admitted to the hospital on 03/14/20 due to Acute on chronic combined systolic and diastolic CHF. Discharge date was 03/17/20. Discharged from [facility].    New?Medications Started at [facility] Discharge:?? Lisinopril 2.5 mg- one tab daily  All other medications remain the same after Hospital Discharge  Medications: Outpatient Encounter Medications as of 07/26/2020  Medication Sig   albuterol (PROAIR HFA) 108 (90 Base) MCG/ACT inhaler Inhale 2 puffs into the lungs every 4 (four) hours as needed for wheezing or shortness of breath.   albuterol (PROVENTIL) (2.5 MG/3ML) 0.083% nebulizer solution Take 3 mLs (2.5 mg total) by nebulization every 4 (four) hours as needed for wheezing or shortness of breath. Dx:J45.41   albuterol (PROVENTIL) (5 MG/ML) 0.5% nebulizer solution Inhale into the lungs.   carvedilol (COREG) 6.25 MG tablet Take 1 tablet (6.25 mg total) by mouth 2 (two) times daily.   cetirizine (ZYRTEC) 10 MG tablet Take 1 tablet (10 mg total) by mouth daily as needed for allergies or rhinitis.   fluticasone (FLONASE) 50 MCG/ACT nasal spray Place 2 sprays into both nostrils daily.   Fluticasone-Salmeterol (ADVAIR DISKUS) 250-50 MCG/DOSE AEPB Inhale 1 puff into the lungs 2 (two) times daily.   furosemide (LASIX) 40 MG tablet Take 1 tablet  (40 mg total) by mouth daily.   ipratropium (ATROVENT) 0.03 % nasal spray Place 2 sprays into both nostrils 3 (three) times daily as needed for rhinitis (nasal congestion).   ipratropium-albuterol (DUONEB) 0.5-2.5 (3) MG/3ML SOLN Take 3 mLs by nebulization every 2 (two) hours as needed.   lisinopril (ZESTRIL) 2.5 MG tablet Take 2.5 mg by mouth daily.   Omega-3 Fatty Acids (FISH OIL) 1000 MG CAPS Take 1 capsule by mouth daily.   No facility-administered encounter medications on file as of 07/26/2020.    Current Documented Medications  albuterol 108 MCG/ACT inhaler albuterol (PROVENTIL) (2.5 MG/3ML) 0.083% nebulizer solution albuterol (PROVENTIL) (5 MG/ML) 0.5% nebulizer solution carvedilol 6.25 MG- 90 DS last filled 05/10/20 cetirizine 10 MG tablet fluticasone 50 MCG/ACT nasal spray Fluticasone-Salmeterol 250-50 MCG/DOSE AEPB- 90 DS last filled 05/07/20 furosemide 40 MG- 90 DS last filled 05/04/20 ipratropium (ATROVENT) 0.03 % nasal spray ipratropium-albuterol (DUONEB) 0.5-2.5 (3) MG/3ML SOLN Omega-3 Fatty Acids (FISH OIL) 1000 MG CAPS  Star Rating Drugs: lisinopril 2.5 MG - 30 DS last filled 05/04/20  Purvis Kilts CPA,CMA

## 2020-08-03 ENCOUNTER — Telehealth: Payer: Medicare Other

## 2020-08-13 ENCOUNTER — Other Ambulatory Visit: Payer: Self-pay | Admitting: Family Medicine

## 2020-08-13 DIAGNOSIS — J4531 Mild persistent asthma with (acute) exacerbation: Secondary | ICD-10-CM

## 2020-09-12 ENCOUNTER — Other Ambulatory Visit: Payer: Self-pay | Admitting: *Deleted

## 2020-09-12 DIAGNOSIS — J4531 Mild persistent asthma with (acute) exacerbation: Secondary | ICD-10-CM

## 2020-09-12 MED ORDER — ALBUTEROL SULFATE (2.5 MG/3ML) 0.083% IN NEBU: 2.5000 mg | INHALATION_SOLUTION | RESPIRATORY_TRACT | 6 refills | Status: DC | PRN

## 2020-09-13 ENCOUNTER — Ambulatory Visit (INDEPENDENT_AMBULATORY_CARE_PROVIDER_SITE_OTHER): Payer: Medicare Other | Admitting: Pharmacist

## 2020-09-13 ENCOUNTER — Other Ambulatory Visit: Payer: Self-pay

## 2020-09-13 DIAGNOSIS — J4531 Mild persistent asthma with (acute) exacerbation: Secondary | ICD-10-CM

## 2020-09-13 DIAGNOSIS — E785 Hyperlipidemia, unspecified: Secondary | ICD-10-CM

## 2020-09-13 DIAGNOSIS — I10 Essential (primary) hypertension: Secondary | ICD-10-CM
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
## 2020-09-13 NOTE — Patient Instructions (Signed)
Visit Information   PATIENT GOALS:   Goals Addressed             This Visit's Progress    Medication Management       Patient Goals/Self-Care Activities Over the next 180 days, patient will:  take medications as prescribed and check blood pressure 1-2x per week, document, and provide at future appointments  Follow Up Plan: Telephone follow up appointment with care management team member scheduled for:  6 months         Consent to CCM Services: Briana Silva was given information about Chronic Care Management services including:  CCM service includes personalized support from designated clinical staff supervised by her physician, including individualized plan of care and coordination with other care providers 24/7 contact phone numbers for assistance for urgent and routine care needs. Service will only be billed when office clinical staff spend 20 minutes or more in a month to coordinate care. Only one practitioner may furnish and bill the service in a calendar month. The patient may stop CCM services at any time (effective at the end of the month) by phone call to the office staff. The patient will be responsible for cost sharing (co-pay) of up to 20% of the service fee (after annual deductible is met).  Patient agreed to services and verbal consent obtained.   The patient verbalized understanding of instructions, educational materials, and care plan provided today and agreed to receive a mailed copy of patient instructions, educational materials, and care plan.   Telephone follow up appointment with care management team member scheduled for: 6 months  [place]: Patient Care Plan: Medication Management     Problem Identified: HTN, HLD, asthma      Long-Range Goal: Disease Progression Prevention   Start Date: 09/13/2020  This Visit's Progress: On track  Priority: High  Note:   Current Barriers:  None at present Lisinopril appeared as  non-adherent per our fill history, but was filled in [place]. Pt does extended stretches living between [place].  Pharmacist Clinical Goal(s):  Over the next 180 days, patient will adhere to plan to optimize therapeutic regimen for chronic conditions & prevention of cardiac events as evidenced by report of adherence to recommended medication management changes through collaboration with PharmD and provider.   Interventions: 1:1 collaboration with Hali Marry, MD regarding development and update of comprehensive plan of care as evidenced by provider attestation and co-signature Inter-disciplinary care team collaboration (see longitudinal plan of care) Comprehensive medication review performed; medication list updated in electronic medical record  Hypertension:  Controlled; current treatment:coreg 6.36m BID, lisinopril 2.567mdaily;   Current home readings: states runs "lower" more often than higher but cannot remember numbers. Used to average 130s/80s  Denies hypotensive/hypertensive symptoms  Recommended continue current regimen, check BP & HR at home 1-2x per week,  Hyperlipidemia:  Uncontrolled; current treatment:fish oil OTC; LDL 137 in 2021  Medications previously tried: simvastatin, could not recall why medication was discontinued  Current dietary patterns: states she is doing much better with her diet  Educated on statin benefits, possible side effects, and drug differences within the statin family Recommended repeat lipid panel at next PCP follow up and initiate rosuvastatin 55m57maily (titrated up as tolerated) if LDL remains >70. Asthma:  Uncontrolled - states exacerbated by environment (damp outside/pollen, lives with smoker currently); current treatment:albuterol PRN, advair, atrovent & duonebs PRN  Recommended continue current regimen, states inhalers are working well for  her  Patient Goals/Self-Care Activities Over the next 180 days, patient will:  take medications as  prescribed and check blood pressure 1-2x per week, document, and provide at future appointments  Follow Up Plan: Telephone follow up appointment with care management team member scheduled for:  6 months

## 2020-09-15 ENCOUNTER — Other Ambulatory Visit: Payer: Self-pay

## 2020-09-15 ENCOUNTER — Ambulatory Visit (INDEPENDENT_AMBULATORY_CARE_PROVIDER_SITE_OTHER): Payer: Medicare Other

## 2020-09-15 DIAGNOSIS — Z5329 Procedure and treatment not carried out because of patient's decision for other reasons: Secondary | ICD-10-CM

## 2020-09-19 ENCOUNTER — Telehealth: Payer: Medicare Other

## 2020-09-28 ENCOUNTER — Ambulatory Visit (INDEPENDENT_AMBULATORY_CARE_PROVIDER_SITE_OTHER): Payer: Medicare Other | Admitting: Family Medicine

## 2020-09-28 DIAGNOSIS — Z0000 Encounter for general adult medical examination without abnormal findings: Secondary | ICD-10-CM

## 2020-09-28 DIAGNOSIS — Z780 Asymptomatic menopausal state: Secondary | ICD-10-CM

## 2020-09-28 NOTE — Patient Instructions (Addendum)
  MEDICARE ANNUAL WELLNESS VISIT Health Maintenance Summary and Written Plan of Care  [name] ,  Thank you for allowing me to perform your Medicare Annual Wellness Visit and for your ongoing commitment to your health.   Health Maintenance & Immunization History Health Maintenance  Topic Date Due   COVID-19 Vaccine (5 - Booster for Moderna series) 10/14/2020 (Originally 04/01/2020)   Zoster Vaccines- Shingrix (1 of 2) 12/28/2020 (Originally 07/04/1964)   INFLUENZA VACCINE  04/12/2021 (Originally 08/13/2020)   TETANUS/TDAP  09/09/2023   DEXA SCAN  Completed   Hepatitis C Screening  Completed   PNA vac Low Risk Adult  Completed   HPV VACCINES  Aged Out   Immunization History  Administered Date(s) Administered   Fluad Quad(high Dose 65+) 12/03/2019   Influenza Split 10/18/2010, 09/26/2011   Influenza, High Dose Seasonal PF 09/25/2016, 12/01/2017   Influenza,inj,Quad PF,6+ Mos 01/10/2013, 09/08/2013, 09/22/2014, 09/27/2015   Moderna Sars-Covid-2 Vaccination 02/24/2019, 03/24/2019, 11/17/2019, 12/03/2019   Pneumococcal Conjugate-13 06/22/2014   Pneumococcal Polysaccharide-23 01/13/2009   Tdap 09/08/2013    These are the patient goals that we discussed:  Goals Addressed               This Visit's Progress     Patient Stated (pt-stated)        To loose 10 lbs.         This is a list of Health Maintenance Items that are overdue or due now: Influenza vaccine Bone densitometry screening Shingles vaccine  Orders/Referrals Placed Today: Orders Placed This Encounter  Procedures   DEXAScan    Standing Status:   Future    Standing Expiration Date:   09/28/2021    Scheduling Instructions:     Please call patient to schedule.    Order Specific Question:   Reason for exam:    Answer:   Post menopausal    Order Specific Question:   Preferred imaging location?    Answer:   [facility]   (Contact our referral department at [phone] if you have not spoken with  someone about your referral appointment within the next 5 days)    Follow-up Plan Follow-up with Briana Games, MD as planned Schedule your nurse visit for your flu shot.  Let us know about your shingles vaccine dates.  Referral for your bone density has been sent and they will you to schedule.  Medicare wellness visit in one year. AVS printed and mailed to the patient.

## 2020-09-28 NOTE — Progress Notes (Signed)
MEDICARE ANNUAL WELLNESS VISIT  09/28/2020  Telephone Visit Disclaimer This Medicare AWV was conducted by telephone due to national recommendations for restrictions regarding the COVID-19 Pandemic (e.g. social distancing).  I verified, using two identifiers, that I am speaking with Briana Silva or their authorized healthcare agent. I discussed the limitations, risks, security, and privacy concerns of performing an evaluation and management service by telephone and the potential availability of an in-person appointment in the future. The patient expressed understanding and agreed to proceed.  Location of Patient: Home Location of Provider (nurse):  In the office.  Subjective:    Briana Silva is a 75 y.o. female patient of Metheney, Barbarann Ehlers, MD who had a Medicare Annual Wellness Visit today via telephone. Briana Silva is Retired and lives alone. she has 2 children, one has deceased. she reports that she is socially active and does interact with friends/family regularly. she is minimally physically active and enjoys writing and doing puzzles.  Patient Care Team: Agapito Games, MD as PCP - General (Family Medicine) Gabriel Carina, [facility] as Pharmacist (Pharmacist)  Advanced Directives 09/28/2020 06/20/2019  Does Patient Have a Medical Advance Directive? No No  Would patient like information on creating a medical advance directive? No - Patient declined No - Patient declined    Hospital Utilization Over the Past 12 Months: # of hospitalizations or ER visits: 2 # of surgeries: 1  Review of Systems    Patient reports that her overall health is better compared to last year.  History obtained from chart review and the patient  Patient Reported Readings (BP, Pulse, CBG, Weight, etc) none  Pain Assessment Pain : No/denies pain     Current Medications & Allergies (verified) Allergies as of 09/28/2020       Reactions   Metoprolol Swelling   Other reaction(s): Other (See  Comments) Severe fatigue, "tightness around my neck", severe leg weakness   Dust Mite Mixed Allergen Ext [mite (d. Farinae)]         Medication List        Accurate as of September 28, 2020 10:06 AM. If you have any questions, ask your nurse or doctor.          albuterol 108 (90 Base) MCG/ACT inhaler Commonly known as: ProAir HFA Inhale 2 puffs into the lungs every 4 (four) hours as needed for wheezing or shortness of breath.   albuterol (5 MG/ML) 0.5% nebulizer solution Commonly known as: PROVENTIL Inhale into the lungs.   albuterol (2.5 MG/3ML) 0.083% nebulizer solution Commonly known as: PROVENTIL Take 3 mLs (2.5 mg total) by nebulization every 4 (four) hours as needed for wheezing or shortness of breath. Dx:J45.41   aspirin EC 81 MG tablet Take 81 mg by mouth daily. Swallow whole.   carvedilol 6.25 MG tablet Commonly known as: COREG Take 1 tablet (6.25 mg total) by mouth 2 (two) times daily.   cetirizine 10 MG tablet Commonly known as: ZYRTEC Take 1 tablet (10 mg total) by mouth daily as needed for allergies or rhinitis.   ferrous sulfate 324 MG Tbec Take 324 mg by mouth.   Fish Oil 1000 MG Caps Take 1 capsule by mouth daily.   fluticasone 50 MCG/ACT nasal spray Commonly known as: FLONASE Place 2 sprays into both nostrils daily.   fluticasone-salmeterol 250-50 MCG/ACT Aepb Commonly known as: ADVAIR INHALE 1 DOSE BY MOUTH INTO THE LUNGS TWICE DAILY   furosemide 40 MG tablet Commonly known as: LASIX Take 1 tablet (40  mg total) by mouth daily.   ipratropium 0.03 % nasal spray Commonly known as: ATROVENT Place 2 sprays into both nostrils 3 (three) times daily as needed for rhinitis (nasal congestion).   lisinopril 2.5 MG tablet Commonly known as: ZESTRIL Take 2.5 mg by mouth daily.   multivitamin with minerals tablet Take 1 tablet by mouth daily.   QC Vitamin D3 50 MCG (2000 UT) Tabs Generic drug: Cholecalciferol Take 1 tablet by mouth daily.         History (reviewed): Past Medical History:  Diagnosis Date   Abnormal echocardiogram 09/18/2016   Mild LVH, EF 55-65%, moderately dilated LA and RA   Allergy    Asthma    CHF (congestive heart failure) (HCC)    Depression    Hyperlipidemia    Hypertension    Obesity    Past Surgical History:  Procedure Laterality Date   LEFT OOPHORECTOMY  1967   TUBAL LIGATION  1967   Family History  Problem Relation Age of Onset   Hypertension Mother    Alzheimer's disease Mother    Pancreatic cancer Father    Hypertension Father    Diabetes Brother    Cancer Daughter 63       Brain tumor   Non-Hodgkin's lymphoma Brother    Leukemia Brother    Other Sister        brain tumor   Social History   Socioeconomic History   Marital status: Widowed    Spouse name: Not on file   Number of children: 2   Years of education: 14   Highest education level: Associate degree: academic program  Occupational History   Occupation: Retired.   Tobacco Use   Smoking status: Never   Smokeless tobacco: Never  Substance and Sexual Activity   Alcohol use: No   Drug use: No   Sexual activity: Not on file  Other Topics Concern   Not on file  Social History Narrative   Lives alone. She had two children, one has decreased. She enjoys writing and doing puzzles. She did get a new puppy recently.   Social Determinants of Health   Financial Resource Strain: Low Risk    Difficulty of Paying Living Expenses: Not hard at all  Food Insecurity: No Food Insecurity   Worried About Programme researcher, broadcasting/film/video in the Last Year: Never true   Ran Out of Food in the Last Year: Never true  Transportation Needs: No Transportation Needs   Lack of Transportation (Medical): No   Lack of Transportation (Non-Medical): No  Physical Activity: Inactive   Days of Exercise per Week: 0 days   Minutes of Exercise per Session: 0 min  Stress: No Stress Concern Present   Feeling of Stress : Not at all  Social Connections:  Socially Isolated   Frequency of Communication with Friends and Family: More than three times a week   Frequency of Social Gatherings with Friends and Family: Once a week   Attends Religious Services: Never   Database administrator or Organizations: No   Attends Banker Meetings: Never   Marital Status: Widowed    Activities of Daily Living In your present state of health, do you have any difficulty performing the following activities: 09/28/2020  Hearing? Y  Comment Left ear hearing loss.  Vision? N  Difficulty concentrating or making decisions? N  Walking or climbing stairs? N  Dressing or bathing? N  Doing errands, shopping? N  Preparing Food and eating ?  N  Using the Toilet? N  In the past six months, have you accidently leaked urine? N  Do you have problems with loss of bowel control? N  Managing your Medications? N  Managing your Finances? N  Housekeeping or managing your Housekeeping? N  Some recent data might be hidden    Patient Education/ Literacy How often do you need to have someone help you when you read instructions, pamphlets, or other written materials from your doctor or pharmacy?: 1 - Never What is the last grade level you completed in school?: associates degree  Exercise Current Exercise Habits: Home exercise routine, Type of exercise: stretching, Time (Minutes): 30, Frequency (Times/Week): 4, Weekly Exercise (Minutes/Week): 120, Intensity: Mild, Exercise limited by: None identified  Diet Patient reports consuming 2 meals a day and 1 snack(s) a day Patient reports that her primary diet is: Regular Patient reports that she does have regular access to food.   Depression Screen PHQ 2/9 Scores 09/28/2020 03/27/2016 06/25/2015 01/10/2013  PHQ - 2 Score 0 1 0 0     Fall Risk Fall Risk  09/28/2020 06/20/2019 03/27/2016 01/10/2013  Falls in the past year? 0 0 No No  Number falls in past yr: 0 - - -  Injury with Fall? 0 - - -  Risk for fall due to : No  Fall Risks No Fall Risks - -  Follow up Falls evaluation completed - - -     Objective:  Briana Silva seemed alert and oriented and she participated appropriately during our telephone visit.  Blood Pressure Weight BMI  BP Readings from Last 3 Encounters:  03/22/20 126/80  01/02/20 130/89  12/23/19 (!) 135/93   Wt Readings from Last 3 Encounters:  03/22/20 268 lb (121.6 kg)  01/02/20 270 lb (122.5 kg)  12/23/19 274 lb (124.3 kg)   BMI Readings from Last 1 Encounters:  03/22/20 41.97 kg/m    *Unable to obtain current vital signs, weight, and BMI due to telephone visit type  Hearing/Vision  Briana Silva did not seem to have difficulty with hearing/understanding during the telephone conversation Reports that she has not had a formal eye exam by an eye care professional within the past year Reports that she has not had a formal hearing evaluation within the past year *Unable to fully assess hearing and vision during telephone visit type  Cognitive Function: 6CIT Screen 09/28/2020 06/20/2019  What Year? 0 points 0 points  What month? 0 points 0 points  What time? 0 points 0 points  Count back from 20 0 points 0 points  Months in reverse 0 points 0 points  Repeat phrase 0 points 4 points  Total Score 0 4   (Normal:0-7, Significant for Dysfunction: >8)  Normal Cognitive Function Screening: Yes   Immunization & Health Maintenance Record Immunization History  Administered Date(s) Administered   Fluad Quad(high Dose 65+) 12/03/2019   Influenza Split 10/18/2010, 09/26/2011   Influenza, High Dose Seasonal PF 09/25/2016, 12/01/2017   Influenza,inj,Quad PF,6+ Mos 01/10/2013, 09/08/2013, 09/22/2014, 09/27/2015   Moderna Sars-Covid-2 Vaccination 02/24/2019, 03/24/2019, 11/17/2019, 12/03/2019   Pneumococcal Conjugate-13 06/22/2014   Pneumococcal Polysaccharide-23 01/13/2009   Tdap 09/08/2013    Health Maintenance  Topic Date Due   COVID-19 Vaccine (5 - Booster for Moderna series)  10/14/2020 (Originally 04/01/2020)   Zoster Vaccines- Shingrix (1 of 2) 12/28/2020 (Originally 07/04/1964)   INFLUENZA VACCINE  04/12/2021 (Originally 08/13/2020)   TETANUS/TDAP  09/09/2023   DEXA SCAN  Completed   Hepatitis C Screening  Completed  PNA vac Low Risk Adult  Completed   HPV VACCINES  Aged Out       Assessment  This is a routine wellness examination for Briana Silva.  Health Maintenance: Due or Overdue There are no preventive care reminders to display for this patient.   Briana Silva does not need a referral for Community Assistance: Care Management:   no Social Work:    no Prescription Assistance:  no Nutrition/Diabetes Education:  no   Plan:  Personalized Goals  Goals Addressed               This Visit's Progress     Patient Stated (pt-stated)        To loose 10 lbs.       Personalized Health Maintenance & Screening Recommendations  Influenza vaccine Bone densitometry screening Shingles vaccine  Lung Cancer Screening Recommended: no (Low Dose CT Chest recommended if Age 78-80 years, 30 pack-year currently smoking OR have quit w/in past 15 years) Hepatitis C Screening recommended: no HIV Screening recommended: no  Advanced Directives: Written information was not prepared per patient's request.  Referrals & Orders Orders Placed This Encounter  Procedures   DEXAScan     Follow-up Plan Follow-up with Agapito Games, MD as planned Schedule your nurse visit for your flu shot.  Let us know about your shingles vaccine dates.  Referral for your bone density has been sent and they will you to schedule.  Medicare wellness visit in one year. AVS printed and mailed to the patient.   I have personally reviewed and noted the following in the patient's chart:   Medical and social history Use of alcohol, tobacco or illicit drugs  Current medications and supplements Functional ability and status Nutritional status Physical  activity Advanced directives List of other physicians Hospitalizations, surgeries, and ER visits in previous 12 months Vitals Screenings to include cognitive, depression, and falls Referrals and appointments  In addition, I have reviewed and discussed with Briana Silva certain preventive protocols, quality metrics, and best practice recommendations. A written personalized care plan for preventive services as well as general preventive health recommendations is available and can be mailed to the patient at her request.      Modesto Charon, RN  09/28/2020

## 2020-11-28 ENCOUNTER — Other Ambulatory Visit: Payer: Self-pay

## 2020-11-28 ENCOUNTER — Ambulatory Visit (INDEPENDENT_AMBULATORY_CARE_PROVIDER_SITE_OTHER): Payer: Medicare Other

## 2020-11-28 DIAGNOSIS — Z780 Asymptomatic menopausal state: Secondary | ICD-10-CM

## 2020-11-28 DIAGNOSIS — Z0000 Encounter for general adult medical examination without abnormal findings: Secondary | ICD-10-CM

## 2020-11-28 NOTE — Progress Notes (Signed)
  The current recommendation for osteopenia (mildly thin bones) treatment includes:   #1 calcium-total of 1200 mg of calcium daily.  If you eat a very calcium rich diet you may be able to obtain that without a supplement.  If not, then I recommend calcium 500 mg twice a day.  There are several products over-the-counter such as Caltrate D and Viactiv chews which are great options that contain calcium and vitamin D. #2 vitamin D-recommend 800 international units daily. #3 exercise-recommend 30 minutes of weightbearing exercise 3 days a week.  Resistance training ,such as doing bands and light weights, can be particularly helpful.

## 2020-12-11 DIAGNOSIS — I5042 Chronic combined systolic (congestive) and diastolic (congestive) heart failure: Secondary | ICD-10-CM | POA: Insufficient documentation

## 2020-12-11 DIAGNOSIS — I509 Heart failure, unspecified: Secondary | ICD-10-CM | POA: Insufficient documentation

## 2020-12-13 DIAGNOSIS — I340 Nonrheumatic mitral (valve) insufficiency: Secondary | ICD-10-CM | POA: Insufficient documentation

## 2020-12-19 DIAGNOSIS — I2510 Atherosclerotic heart disease of native coronary artery without angina pectoris: Secondary | ICD-10-CM | POA: Insufficient documentation

## 2020-12-19 DIAGNOSIS — I2584 Coronary atherosclerosis due to calcified coronary lesion: Secondary | ICD-10-CM | POA: Insufficient documentation

## 2020-12-19 NOTE — Progress Notes (Signed)
Referring-Briana Metheney MD Reason for referral-CHF  HPI: 75 yo female for evaluation of CHF at request of [name] MD. Pt seen previously but not since 2/17. Patient previously had congestive heart failure in [place]. She was told her heart was functioning at "30%". She was treated with medications with improvement. Apparently stress test normal but no records available. Echocardiogram January 2017 showed normal LV systolic function, mild left ventricular hypertrophy and biatrial enlargement. Previously seen at [facility] and reduced LV function felt possibly related to atrial tachycardia. Echo 11/22 showed EF 35-40, mild RAE, moderate MR, mild to moderate TR. CTA 12/22 showed no pulmonary embolus; nodule at major fissure and FU recommended 6 months. Nuclear study 12/22 showed EF 38 and no ischemia.  Since she was hospitalized with CHF she has improved on present dose of Lasix.  She has some dyspnea on exertion and pedal edema but markedly improved compared to hospitalization.  There is no orthopnea or PND.  She denies chest pain or syncope.  She occasionally has palpitations described as her heart racing.  Cardiology asked to evaluate.  Current Outpatient Medications  Medication Sig Dispense Refill   albuterol (PROAIR HFA) 108 (90 Base) MCG/ACT inhaler Inhale 2 puffs into the lungs every 4 (four) hours as needed for wheezing or shortness of breath. 6.7 g 5   albuterol (PROVENTIL) (2.5 MG/3ML) 0.083% nebulizer solution Take 3 mLs (2.5 mg total) by nebulization every 4 (four) hours as needed for wheezing or shortness of breath. Dx:J45.41 75 mL 6   albuterol (PROVENTIL) (5 MG/ML) 0.5% nebulizer solution Inhale into the lungs.     aspirin EC 81 MG tablet Take 81 mg by mouth daily. Swallow whole.     carvedilol (COREG) 6.25 MG tablet Take 1 tablet (6.25 mg total) by mouth 2 (two) times daily. (Patient taking differently: Take 12.5 mg by mouth 2 (two) times daily.) 180 tablet 1    cetirizine (ZYRTEC) 10 MG tablet Take 1 tablet (10 mg total) by mouth daily as needed for allergies or rhinitis. 30 tablet 5   Cholecalciferol (QC VITAMIN D3) 50 MCG (2000 UT) TABS Take 1 tablet by mouth daily.     ferrous sulfate 324 MG TBEC Take 324 mg by mouth.     fluticasone-salmeterol (ADVAIR) 250-50 MCG/ACT AEPB INHALE 1 DOSE BY MOUTH INTO THE LUNGS TWICE DAILY 180 each 0   furosemide (LASIX) 40 MG tablet Take 1 tablet (40 mg total) by mouth daily. 90 tablet 1   Multiple Vitamins-Minerals (MULTIVITAMIN WITH MINERALS) tablet Take 1 tablet by mouth daily.     Omega-3 Fatty Acids (FISH OIL) 1000 MG CAPS Take 1 capsule by mouth daily.     fluticasone (FLONASE) 50 MCG/ACT nasal spray Place 2 sprays into both nostrils daily. (Patient not taking: Reported on 09/13/2020) 16 g 6   ipratropium (ATROVENT) 0.03 % nasal spray Place 2 sprays into both nostrils 3 (three) times daily as needed for rhinitis (nasal congestion). (Patient not taking: Reported on 09/28/2020) 30 mL 0   lisinopril (ZESTRIL) 2.5 MG tablet Take 2.5 mg by mouth daily. (Patient not taking: Reported on 12/26/2020)     No current facility-administered medications for this visit.    Allergies  Allergen Reactions   Metoprolol Swelling    Other reaction(s): Other (See Comments) Severe fatigue, "tightness around my neck", severe leg weakness    Dust Mite Mixed Allergen Ext [Mite (D. Farinae)]      Past Medical History:  Diagnosis Date  Abnormal echocardiogram 09/18/2016   Mild LVH, EF 55-65%, moderately dilated LA and RA   Allergy    Asthma    Asthma    Atrial tachycardia (HCC)    Cardiomyopathy (HCC)    CHF (congestive heart failure) (HCC)    Depression    Hyperlipidemia    Hypertension    Obesity     Past Surgical History:  Procedure Laterality Date   LEFT OOPHORECTOMY  1967   TUBAL LIGATION  1967    Social History   Socioeconomic History   Marital status: Widowed    Spouse name: Not on file   Number of  children: 2   Years of education: 14   Highest education level: Associate degree: academic program  Occupational History   Occupation: Retired.   Tobacco Use   Smoking status: Never   Smokeless tobacco: Never  Substance and Sexual Activity   Alcohol use: No   Drug use: No   Sexual activity: Not on file  Other Topics Concern   Not on file  Social History Narrative   Lives alone. She had two children, one has decreased. She enjoys writing and doing puzzles. She did get a new puppy recently.   Social Determinants of Health   Financial Resource Strain: Low Risk    Difficulty of Paying Living Expenses: Not hard at all  Food Insecurity: No Food Insecurity   Worried About Programme researcher, broadcasting/film/video in the Last Year: Never true   Ran Out of Food in the Last Year: Never true  Transportation Needs: No Transportation Needs   Lack of Transportation (Medical): No   Lack of Transportation (Non-Medical): No  Physical Activity: Inactive   Days of Exercise per Week: 0 days   Minutes of Exercise per Session: 0 min  Stress: No Stress Concern Present   Feeling of Stress : Not at all  Social Connections: Socially Isolated   Frequency of Communication with Friends and Family: More than three times a week   Frequency of Social Gatherings with Friends and Family: Once a week   Attends Religious Services: Never   Database administrator or Organizations: No   Attends Banker Meetings: Never   Marital Status: Widowed  Catering manager Violence: Not At Risk   Fear of Current or Ex-Partner: No   Emotionally Abused: No   Physically Abused: No   Sexually Abused: No    Family History  Problem Relation Age of Onset   Hypertension Mother    Alzheimer's disease Mother    Cancer Father    Pancreatic cancer Father    Hypertension Father    Other Sister        brain tumor   Diabetes Brother    Non-Hodgkin's lymphoma Brother    Leukemia Brother    Cancer Daughter 34       Brain tumor     ROS: no fevers or chills, productive cough, hemoptysis, dysphasia, odynophagia, melena, hematochezia, dysuria, hematuria, rash, seizure activity, orthopnea, PND, pedal edema, claudication. Remaining systems are negative.  Physical Exam:   Blood pressure 135/88, pulse 70, height 5' 6.25" (1.683 m), weight 253 lb (114.8 kg), SpO2 98 %.  General:  Well developed/well nourished in NAD Skin warm/dry Patient not depressed No peripheral clubbing Back-normal HEENT-normal/normal eyelids Neck supple/normal carotid upstroke bilaterally; no bruits; no JVD; no thyromegaly chest - CTA/ normal expansion CV - RRR/normal S1 and S2; no murmurs, rubs or gallops;  PMI nondisplaced Abdomen -NT/ND, no HSM, no mass, +  bowel sounds, no bruit 2+ femoral pulses, no bruits Ext-no edema, chords, 2+ DP Neuro-grossly nonfocal  ECG -normal sinus rhythm at a rate of 70, occasional PAC, no ST changes.  Personally reviewed  A/P  1 NICM-Question if atrial tachycardia is contributing to cardiomyopathy.  We will plan 3-day monitor to further assess. Will arrange CTA to R/O obstructive CAD. Continue coreg; patient states her systolic blood pressure is approximately 110 at home.  Not clear to me that she will tolerate Entresto.  I will begin losartan 25 mg daily to see if her blood pressure tolerates.  If so we will consider transition to [facility] in the future.  2 H/O atrial tachycardia-continue beta-blocker.  Plan monitor as outlined above to rule out atrial tachycardia as a contributor to cardiomyopathy.  3 chronic systolic CHF-she appears to be euvolemic on examination.  Continue Lasix at present dose.  Add spironolactone 12.5 mg daily.  Continue Farxiga.  Check potassium and renal function in 1 week.    4 hypertension-blood pressure controlled.  Medication adjustments for CHF as outlined above.  5 hyperlipidemia-Continue statin.  6 pulmonary nodule-patient instructed to follow-up primary care.  Kirk Ruths, MD

## 2020-12-20 ENCOUNTER — Inpatient Hospital Stay: Payer: Medicare Other | Admitting: Family Medicine

## 2020-12-21 ENCOUNTER — Telehealth: Payer: Self-pay | Admitting: General Practice

## 2020-12-21 NOTE — Telephone Encounter (Signed)
Transition Care Management Unsuccessful Follow-up Telephone Call  Date of discharge and from where:  12/20/20 from [facility]  Attempts:  1st Attempt  Reason for unsuccessful TCM follow-up call:  Left voice message    

## 2020-12-24 NOTE — Telephone Encounter (Signed)
Transition Care Management Follow-up Telephone Call Date of discharge and from where: 12/20/20 from [facility] How have you been since you were released from the hospital? Doing better.  Any questions or concerns? No  Items Reviewed: Did the pt receive and understand the discharge instructions provided? Yes  Medications obtained and verified? No  Other? No  Any new allergies since your discharge? No  Dietary orders reviewed? Yes Do you have support at home? Yes   Home Care and Equipment/Supplies: Were home health services ordered? yes If so, what is the name of the agency? [facility]  Has the agency set up a time to come to the patient's home? yes Were any new equipment or medical supplies ordered?  No Were you able to get the supplies/equipment? not applicable Do you have any questions related to the use of the equipment or supplies? No  Functional Questionnaire: (I = Independent and D = Dependent) ADLs: I  Bathing/Dressing- I  Meal Prep- D (her daughter is helping)  Eating- I  Maintaining continence- I  Transferring/Ambulation- I with walker.  Managing Meds- I  Follow up appointments reviewed:  PCP Hospital f/u appt confirmed? Yes  Scheduled to see Dr. Linford Arnold on 01/03/21  Specialist Hospital f/u appt confirmed? Yes  Scheduled to see Dr. Jens Som on 12/26/20 @ 3pm. Are transportation arrangements needed? No  If their condition worsens, is the pt aware to call PCP or go to the Emergency Dept.? Yes Was the patient provided with contact information for the PCP's office or ED? Yes Was to pt encouraged to call back with questions or concerns? Yes

## 2020-12-25 ENCOUNTER — Telehealth: Payer: Self-pay | Admitting: *Deleted

## 2020-12-25 NOTE — Telephone Encounter (Signed)
Calling for VO for continuation of PT for strengthening.   VO given.   Briana Silva w/[facility] called and stated that she refused the nurse visit today. [phone]. She was calling to give notification of this.

## 2020-12-26 ENCOUNTER — Encounter: Payer: Self-pay | Admitting: Cardiology

## 2020-12-26 ENCOUNTER — Other Ambulatory Visit: Payer: Self-pay | Admitting: Cardiology

## 2020-12-26 ENCOUNTER — Ambulatory Visit (INDEPENDENT_AMBULATORY_CARE_PROVIDER_SITE_OTHER): Payer: Medicare Other

## 2020-12-26 ENCOUNTER — Ambulatory Visit: Payer: Medicare Other | Admitting: Cardiology

## 2020-12-26 ENCOUNTER — Other Ambulatory Visit: Payer: Self-pay

## 2020-12-26 VITALS — BP 135/88 | HR 70 | Ht 66.25 in | Wt 253.0 lb

## 2020-12-26 DIAGNOSIS — I4719 Other supraventricular tachycardia: Secondary | ICD-10-CM

## 2020-12-26 DIAGNOSIS — I429 Cardiomyopathy, unspecified: Secondary | ICD-10-CM

## 2020-12-26 DIAGNOSIS — I10 Essential (primary) hypertension: Secondary | ICD-10-CM | POA: Diagnosis not present

## 2020-12-26 DIAGNOSIS — R002 Palpitations: Secondary | ICD-10-CM

## 2020-12-26 DIAGNOSIS — E7800 Pure hypercholesterolemia, unspecified: Secondary | ICD-10-CM | POA: Diagnosis not present

## 2020-12-26 DIAGNOSIS — I5042 Chronic combined systolic (congestive) and diastolic (congestive) heart failure: Secondary | ICD-10-CM | POA: Diagnosis not present

## 2020-12-26 DIAGNOSIS — I471 Supraventricular tachycardia: Secondary | ICD-10-CM

## 2020-12-26 MED ORDER — LOSARTAN POTASSIUM 25 MG PO TABS: 25.0000 mg | ORAL_TABLET | Freq: Every day | ORAL | 3 refills | Status: DC

## 2020-12-26 MED ORDER — SPIRONOLACTONE 25 MG PO TABS: 12.5000 mg | ORAL_TABLET | Freq: Every day | ORAL | 3 refills | Status: DC

## 2020-12-26 MED ORDER — METOPROLOL TARTRATE 100 MG PO TABS: ORAL_TABLET | ORAL | 0 refills | Status: DC

## 2020-12-26 NOTE — Patient Instructions (Addendum)
Medication Instructions:   START LOSARTAN 25 MG ONCE DAILY  START SPIRONOLACTONE 12.5 MG ONCE DAILY= 1/2 OF THE 25 MG TABLET ONCE DAILY  *If you need a refill on your cardiac medications before your next appointment, please call your pharmacy*   Lab Work:  Your physician recommends that you return for lab work in: ONE WEEK-DO NOT HAVE TO FAST  If you have labs (blood work) drawn today and your tests are completely normal, you will receive your results only by: MyChart Message (if you have MyChart) OR A paper copy in the mail If you have any lab test that is abnormal or we need to change your treatment, we will call you to review the results.   Testing/Procedures:  Your cardiac CT will be scheduled at   [facility] [address] [place], [place] [address] [phone]  If scheduled at [facility], please arrive at the [facility] main entrance (entrance A) of [facility] 30 minutes prior to test start time. You can use the FREE valet parking offered at the main entrance (encouraged to control the heart rate for the test) Proceed to the [facility] Radiology Department (first floor) to check-in and test prep.  Please follow these instructions carefully (unless otherwise directed):  On the Night Before the Test: Be sure to Drink plenty of water. Do not consume any caffeinated/decaffeinated beverages or chocolate 12 hours prior to your test. Do not take any antihistamines 12 hours prior to your test.   On the Day of the Test: Drink plenty of water until 1 hour prior to the test. Do not eat any food 4 hours prior to the test. You may take your regular medications prior to the test.  Take metoprolol (Lopressor) 100 MG two hours prior to test. HOLD Furosemide/Hydrochlorothiazide morning of the test. FEMALES- please wear underwire-free bra if available, avoid dresses & tight clothing        After the Test: Drink plenty of water. After receiving  IV contrast, you may experience a mild flushed feeling. This is normal. On occasion, you may experience a mild rash up to 24 hours after the test. This is not dangerous. If this occurs, you can take Benadryl 25 mg and increase your fluid intake. If you experience trouble breathing, this can be serious. If it is severe call 911 IMMEDIATELY. If it is mild, please call our office. If you take any of these medications: Glipizide/Metformin, Avandament, Glucavance, please do not take 48 hours after completing test unless otherwise instructed.  Please allow 2-4 weeks for scheduling of routine cardiac CTs. Some insurance companies require a pre-authorization which may delay scheduling of this test.   For non-scheduling related questions, please contact the cardiac imaging nurse navigator should you have any questions/concerns: Rockwell Alexandria, Cardiac Imaging Nurse Navigator [name], Cardiac Imaging Nurse Navigator [facility] Direct Office Dial: [phone]   For scheduling needs, including cancellations and rescheduling, please call [place], [phone].   ZIO XT- Long Term Monitor Instructions  Your physician has requested you wear a ZIO patch monitor for 3 days.  This is a single patch monitor. [employer] supplies one patch monitor per enrollment. Additional stickers are not available. Please do not apply patch if you will be having a Nuclear Stress Test,  Echocardiogram, Cardiac CT, MRI, or Chest Xray during the period you would be wearing the  monitor. The patch cannot be worn during these tests. You cannot remove and re-apply the  ZIO XT patch monitor.  Your ZIO patch monitor will be mailed 3 day USPS to your address on file. It may take 3-5 days  to receive your monitor after you have been enrolled.  Once you have received your monitor, please review the enclosed instructions. Your monitor  has already been registered assigning a specific monitor serial # to  you.  Billing and Patient Assistance Program Information  We have supplied [employer] with any of your insurance information on file for billing purposes. [employer] offers a sliding scale Patient Assistance Program for patients that do not have  insurance, or whose insurance does not completely cover the cost of the ZIO monitor.  You must apply for the Patient Assistance Program to qualify for this discounted rate.  To apply, please call [employer] at [phone], select option 4, select option 2, ask to apply for  Patient Assistance Program. Meredeth Ide will ask your household income, and how many people  are in your household. They will quote your out-of-pocket cost based on that information.  [employer] will also be able to set up a 23-month, interest-free payment plan if needed.  Applying the monitor   Shave hair from upper left chest.  Hold abrader disc by orange tab. Rub abrader in 40 strokes over the upper left chest as  indicated in your monitor instructions.  Clean area with 4 enclosed alcohol pads. Let dry.  Apply patch as indicated in monitor instructions. Patch will be placed under collarbone on left  side of chest with arrow pointing upward.  Rub patch adhesive wings for 2 minutes. Remove white label marked "1". Remove the white  label marked "2". Rub patch adhesive wings for 2 additional minutes.  While looking in a mirror, press and release button in center of patch. A small green light will  flash 3-4 times. This will be your only indicator that the monitor has been turned on.  Do not shower for the first 24 hours. You may shower after the first 24 hours.  Press the button if you feel a symptom. You will hear a small click. Record Date, Time and  Symptom in the Patient Logbook.  When you are ready to remove the patch, follow instructions on the last 2 pages of Patient  Logbook. Stick patch monitor onto the last page of Patient Logbook.  Place Patient Logbook in the blue and white box.  Use locking tab on box and tape box closed  securely. The blue and white box has prepaid postage on it. Please place it in the mailbox as  soon as possible. Your physician should have your test results approximately 7 days after the  monitor has been mailed back to [facility].  Call [facility] Customer Care at [phone] if you have questions regarding  your ZIO XT patch monitor. Call them immediately if you see an orange light blinking on your  monitor.  If your monitor falls off in less than 4 days, contact our Monitor department at [phone].  If your monitor becomes loose or falls off after 4 days call [employer] at [phone] for  suggestions on securing your monitor    Follow-Up: At [facility], you and your health needs are our priority.  As part of our continuing mission to provide you with exceptional heart care, we have created designated Provider Care Teams.  These Care Teams include your primary Cardiologist (physician) and Advanced Practice Providers (APPs -  Physician Assistants and Nurse Practitioners) who all work together to provide you with  the care you need, when you need it.  We recommend signing up for the patient portal called "MyChart".  Sign up information is provided on this After Visit Summary.  MyChart is used to connect with patients for Virtual Visits (Telemedicine).  Patients are able to view lab/test results, encounter notes, upcoming appointments, etc.  Non-urgent messages can be sent to your provider as well.   To learn more about what you can do with MyChart, go to [email or web address].    Your next appointment:   3 month(s)  The format for your next appointment:   In Person  Provider:   [name] MD    Other Instructions  MAKE SURE TO TALK TO MEDICAL DOCTOR ABOUT LUNG NODULE

## 2020-12-26 NOTE — Progress Notes (Unsigned)
Enrolled patient for a 3 day Zio XT monitor to be mailed to patients home  

## 2020-12-27 ENCOUNTER — Telehealth: Payer: Self-pay | Admitting: *Deleted

## 2020-12-27 NOTE — Telephone Encounter (Signed)
Move OT to next week. Verbal order given to move appt

## 2020-12-30 ENCOUNTER — Other Ambulatory Visit: Payer: Self-pay | Admitting: Family Medicine

## 2020-12-30 DIAGNOSIS — J4530 Mild persistent asthma, uncomplicated: Secondary | ICD-10-CM

## 2020-12-30 DIAGNOSIS — I429 Cardiomyopathy, unspecified: Secondary | ICD-10-CM

## 2020-12-30 DIAGNOSIS — R7301 Impaired fasting glucose: Secondary | ICD-10-CM

## 2020-12-30 DIAGNOSIS — R002 Palpitations: Secondary | ICD-10-CM | POA: Diagnosis not present

## 2020-12-31 ENCOUNTER — Telehealth: Payer: Self-pay

## 2020-12-31 NOTE — Telephone Encounter (Signed)
[place] from [facility] called and left a message stating Briana Silva's weight has dropped to 244.7 lbs. The range they have on file is 260 lbs - 270 lbs. Please advise.

## 2020-12-31 NOTE — Telephone Encounter (Signed)
I would recommend that she make an appointment.  I have not seen her posthospitalization so I have no idea what is going on with her and why she would actually be losing weight.

## 2020-12-31 NOTE — Telephone Encounter (Signed)
I spoke with [place] and advised of recommendations. She will get in contact with the patient.   I tried to call patient, no answer, no voicemail.

## 2021-01-03 ENCOUNTER — Other Ambulatory Visit: Payer: Self-pay

## 2021-01-03 ENCOUNTER — Ambulatory Visit (INDEPENDENT_AMBULATORY_CARE_PROVIDER_SITE_OTHER): Payer: Medicare Other | Admitting: Family Medicine

## 2021-01-03 ENCOUNTER — Encounter: Payer: Self-pay | Admitting: Family Medicine

## 2021-01-03 VITALS — BP 117/74 | HR 99 | Ht 66.0 in | Wt 243.0 lb

## 2021-01-03 DIAGNOSIS — R918 Other nonspecific abnormal finding of lung field: Secondary | ICD-10-CM | POA: Diagnosis not present

## 2021-01-03 DIAGNOSIS — I5033 Acute on chronic diastolic (congestive) heart failure: Secondary | ICD-10-CM

## 2021-01-03 DIAGNOSIS — J4531 Mild persistent asthma with (acute) exacerbation: Secondary | ICD-10-CM

## 2021-01-03 DIAGNOSIS — J4530 Mild persistent asthma, uncomplicated: Secondary | ICD-10-CM | POA: Diagnosis not present

## 2021-01-03 DIAGNOSIS — I10 Essential (primary) hypertension: Secondary | ICD-10-CM

## 2021-01-03 DIAGNOSIS — R7301 Impaired fasting glucose: Secondary | ICD-10-CM | POA: Diagnosis not present

## 2021-01-03 MED ORDER — FUROSEMIDE 40 MG PO TABS
40.0000 mg | ORAL_TABLET | Freq: Every day | ORAL | 1 refills | Status: DC
Start: 2021-01-03 — End: 2021-02-28

## 2021-01-03 MED ORDER — FLUTICASONE-SALMETEROL 250-50 MCG/ACT IN AEPB: INHALATION_SPRAY | RESPIRATORY_TRACT | 3 refills | Status: DC

## 2021-01-03 NOTE — Progress Notes (Signed)
Established Patient Office Visit  Subjective:  Patient ID: Briana Silva, female    DOB: [birth year]  Age: 75 y.o. MRN: [id number]  CC:  Chief Complaint  Patient presents with   Hospitalization Follow-up    HPI Briana Silva presents for Hospital F/U.    She presented to the emergency department at [facility] on November 28 for acute systolic heart failure.  She was admitted for 9 days and then discharged home on December 8.  They started her on Farxiga, Medrol Dosepak and pravastatin.  They changed her carvedilol.  She presented initially for because of increased shortness of breath and increased lower extremity swelling.  Echocardiogram with TEE showed EF of 35 to 40% with diffuse left ventricular hypokinesis.  She was treated with IV Lasix and then transition to p.o.  Creatinine did remain stable.  She did undergo a Cardiolite stress test on December 7 which showed no ischemia.  She was also treated for pneumonia secondary to influenza A with Tamiflu. Turned in her Zio patch yesterday.  Still awaiting CT coronaries. Hasn't heard back about it yet..  She has lost 10 lbs.  She feels tired but denies excess SIB.    Past Medical History:  Diagnosis Date   Abnormal echocardiogram 09/18/2016   Mild LVH, EF 55-65%, moderately dilated LA and RA   Allergy    Asthma    Asthma    Atrial tachycardia (HCC)    Cardiomyopathy (HCC)    CHF (congestive heart failure) (HCC)    Depression    Hyperlipidemia    Hypertension    Obesity     Past Surgical History:  Procedure Laterality Date   LEFT OOPHORECTOMY  1967   TUBAL LIGATION  1967    Family History  Problem Relation Age of Onset   Hypertension Mother    Alzheimer's disease Mother    Cancer Father    Pancreatic cancer Father    Hypertension Father    Other Sister        brain tumor   Diabetes Brother    Non-Hodgkin's lymphoma Brother    Leukemia Brother    Cancer Daughter 75       Brain tumor    Social History    Socioeconomic History   Marital status: Widowed    Spouse name: Not on file   Number of children: 2   Years of education: 14   Highest education level: Associate degree: academic program  Occupational History   Occupation: Retired.   Tobacco Use   Smoking status: Never   Smokeless tobacco: Never  Substance and Sexual Activity   Alcohol use: No   Drug use: No   Sexual activity: Not on file  Other Topics Concern   Not on file  Social History Narrative   Lives alone. She had two children, one has decreased. She enjoys writing and doing puzzles. She did get a new puppy recently.   Social Determinants of Health   Financial Resource Strain: Low Risk    Difficulty of Paying Living Expenses: Not hard at all  Food Insecurity: No Food Insecurity   Worried About Programme researcher, broadcasting/film/video in the Last Year: Never true   Ran Out of Food in the Last Year: Never true  Transportation Needs: No Transportation Needs   Lack of Transportation (Medical): No   Lack of Transportation (Non-Medical): No  Physical Activity: Inactive   Days of Exercise per Week: 0 days   Minutes of Exercise per Session:  0 min  Stress: No Stress Concern Present   Feeling of Stress : Not at all  Social Connections: Socially Isolated   Frequency of Communication with Friends and Family: More than three times a week   Frequency of Social Gatherings with Friends and Family: Once a week   Attends Religious Services: Never   Database administrator or Organizations: No   Attends Banker Meetings: Never   Marital Status: Widowed  Catering manager Violence: Not At Risk   Fear of Current or Ex-Partner: No   Emotionally Abused: No   Physically Abused: No   Sexually Abused: No    Outpatient Medications Prior to Visit  Medication Sig Dispense Refill   albuterol (PROAIR HFA) 108 (90 Base) MCG/ACT inhaler Inhale 2 puffs into the lungs every 4 (four) hours as needed for wheezing or shortness of breath. 6.7 g 5    albuterol (PROVENTIL) (2.5 MG/3ML) 0.083% nebulizer solution Take 3 mLs (2.5 mg total) by nebulization every 4 (four) hours as needed for wheezing or shortness of breath. Dx:J45.41 75 mL 6   aspirin EC 81 MG tablet Take 81 mg by mouth daily. Swallow whole.     carvedilol (COREG) 12.5 MG tablet Take 12.5 mg by mouth 2 (two) times daily with a meal.     cetirizine (ZYRTEC) 10 MG tablet Take 1 tablet (10 mg total) by mouth daily as needed for allergies or rhinitis. 30 tablet 5   Cholecalciferol (QC VITAMIN D3) 50 MCG (2000 UT) TABS Take 1 tablet by mouth daily.     dapagliflozin propanediol (FARXIGA) 10 MG TABS tablet Take 10 mg by mouth daily.     ferrous sulfate 324 MG TBEC Take 324 mg by mouth.     losartan (COZAAR) 25 MG tablet Take 1 tablet (25 mg total) by mouth daily. 90 tablet 3   Multiple Vitamins-Minerals (MULTIVITAMIN WITH MINERALS) tablet Take 1 tablet by mouth daily.     Omega-3 Fatty Acids (FISH OIL) 1000 MG CAPS Take 1 capsule by mouth daily.     spironolactone (ALDACTONE) 25 MG tablet Take 0.5 tablets (12.5 mg total) by mouth daily. 45 tablet 3   fluticasone-salmeterol (ADVAIR) 250-50 MCG/ACT AEPB INHALE 1 DOSE BY MOUTH INTO THE LUNGS TWICE DAILY 180 each 0   furosemide (LASIX) 40 MG tablet Take 1 tablet (40 mg total) by mouth daily. 90 tablet 1   metoprolol tartrate (LOPRESSOR) 100 MG tablet TAKE 1 TABLET 2 HOURS PRIOR TO CT SCAN 1 tablet 0   No facility-administered medications prior to visit.    Allergies  Allergen Reactions   Metoprolol Swelling    Other reaction(s): Other (See Comments) Severe fatigue, "tightness around my neck", severe leg weakness    Dust Mite Mixed Allergen Ext [Mite (D. Farinae)]     ROS Review of Systems    Objective:    Physical Exam Constitutional:      Appearance: Normal appearance. She is well-developed.  HENT:     Head: Normocephalic and atraumatic.  Cardiovascular:     Rate and Rhythm: Normal rate and regular rhythm.     Heart  sounds: Normal heart sounds.  Pulmonary:     Effort: Pulmonary effort is normal.     Breath sounds: Normal breath sounds.  Skin:    General: Skin is warm and dry.  Neurological:     Mental Status: She is alert and oriented to person, place, and time.  Psychiatric:        Behavior: Behavior  normal.    BP 117/74    Pulse 99    Ht 5\' 6"  (1.676 m)    Wt 243 lb (110.2 kg)    SpO2 97%    BMI 39.22 kg/m  Wt Readings from Last 3 Encounters:  01/03/21 243 lb (110.2 kg)  12/26/20 253 lb (114.8 kg)  03/22/20 268 lb (121.6 kg)     Health Maintenance Due  Topic Date Due   Zoster Vaccines- Shingrix (1 of 2) Never done   COVID-19 Vaccine (5 - Booster for Moderna series) 01/28/2020    There are no preventive care reminders to display for this patient.  Lab Results  Component Value Date   TSH 1.518 01/17/2015   Lab Results  Component Value Date   WBC 5.4 06/20/2019   HGB 13.8 06/20/2019   HCT 42.5 06/20/2019   MCV 90.2 06/20/2019   PLT 273 06/20/2019   Lab Results  Component Value Date   NA 141 03/22/2020   K 4.8 03/22/2020   CO2 32 03/22/2020   GLUCOSE 105 (H) 03/22/2020   BUN 16 03/22/2020   CREATININE 0.87 03/22/2020   BILITOT 0.8 03/22/2020   ALKPHOS 76 06/25/2015   AST 20 03/22/2020   ALT 24 03/22/2020   PROT 7.3 03/22/2020   ALBUMIN 3.8 06/25/2015   CALCIUM 9.9 03/22/2020   Lab Results  Component Value Date   CHOL 233 (H) 06/20/2019   Lab Results  Component Value Date   HDL 81 06/20/2019   Lab Results  Component Value Date   LDLCALC 137 (H) 06/20/2019   Lab Results  Component Value Date   TRIG 48 06/20/2019   Lab Results  Component Value Date   CHOLHDL 2.9 06/20/2019   Lab Results  Component Value Date   HGBA1C 5.5 06/20/2019      Assessment & Plan:   Problem List Items Addressed This Visit       Cardiovascular and Mediastinum   Essential hypertension    Several BPs have been low under 110 and one BP under 100.  Discussed adjust her lasix  with new addition of the spironolactone.  Pulse still rapid so didn't adjust the carvedilol.  F/U in 1 months.       Relevant Medications   furosemide (LASIX) 40 MG tablet   Acute on chronic diastolic (congestive) heart failure (HCC)    Brought in weights and BPS from home.  She is doing really well. Weight is down to 241 at home.  She has had some fluid loss and weight loss.  Now on  Farxiga, spironolactone, lasix, losartan, and carvedilol.    Cut lasix down to 1 tab every other day and 1/2 tab daily in between.   Monitor weights daily.  If go up more than 3 lbs from today's weight then go back to whole tab daily until the 3 lbs is back off.        Relevant Medications   furosemide (LASIX) 40 MG tablet     Respiratory   Mild persistent asthma without complication   Relevant Medications   fluticasone-salmeterol (ADVAIR) 250-50 MCG/ACT AEPB   furosemide (LASIX) 40 MG tablet   Mild persistent asthma with acute exacerbation   Relevant Medications   fluticasone-salmeterol (ADVAIR) 250-50 MCG/ACT AEPB     Endocrine   IFG (impaired fasting glucose)      Lab Results  Component Value Date   HGBA1C 5.5 06/20/2019         Relevant Medications   furosemide (LASIX)  40 MG tablet     Other   Pulmonary nodules - Primary    Plan to recheck in 6 months based on scan att the hospital. .    CT Angio from [facility]:  "There is a stable nodule within the major fissure. This could represent a fissural lymph node, but will need subsequent follow-up due to its size within 6 months. "      CT coronary test ordered but she hasn't heard back about scheduling yet. We will check on that for her.   Meds ordered this encounter  Medications   fluticasone-salmeterol (ADVAIR) 250-50 MCG/ACT AEPB    Sig: INHALE 1 DOSE BY MOUTH INTO THE LUNGS TWICE DAILY    Dispense:  180 each    Refill:  3   furosemide (LASIX) 40 MG tablet    Sig: Take 1 tablet (40 mg total) by mouth daily.    Dispense:  90  tablet    Refill:  1    Follow-up: No follow-ups on file.    Nani Gasser, MD

## 2021-01-03 NOTE — Assessment & Plan Note (Signed)
Several BPs have been low under 110 and one BP under 100.  Discussed adjust her lasix with new addition of the spironolactone.  Pulse still rapid so didn't adjust the carvedilol.  F/U in 1 months.

## 2021-01-03 NOTE — Patient Instructions (Signed)
Cut lasix down to 1 tab every other day and 1/2 tab daily in between.   Monitor weights daily.  If go up more than 3 lbs from today's weight then go back to whole tab daily until the 3 lbs is back off.

## 2021-01-03 NOTE — Assessment & Plan Note (Addendum)
Plan to recheck in 6 months based on scan att the hospital. .    CT Angio from [facility]:  "There is a stable nodule within the major fissure. This could represent a fissural lymph node, but will need subsequent follow-up due to its size within 6 months. "

## 2021-01-03 NOTE — Assessment & Plan Note (Addendum)
Brought in weights and BPS from home.  She is doing really well. Weight is down to 241 at home.  She has had some fluid loss and weight loss.  Now on  Farxiga, spironolactone, lasix, losartan, and carvedilol.    Cut lasix down to 1 tab every other day and 1/2 tab daily in between.   Monitor weights daily.  If go up more than 3 lbs from today's weight then go back to whole tab daily until the 3 lbs is back off.

## 2021-01-04 NOTE — Assessment & Plan Note (Signed)
° °  Lab Results  Component Value Date   HGBA1C 5.5 06/20/2019

## 2021-01-09 ENCOUNTER — Telehealth: Payer: Self-pay | Admitting: Family Medicine

## 2021-01-09 NOTE — Telephone Encounter (Signed)
Patient is got an order in the system for CT of coronary arteries [place].  If you continue call imaging to just check to see where they are at in the process that they are waiting for approval or if they have been trying to maybe reach out to her I am not really sure that that would be really helpful.  Thank you so much.

## 2021-01-10 ENCOUNTER — Telehealth: Payer: Self-pay | Admitting: Cardiology

## 2021-01-10 NOTE — Telephone Encounter (Signed)
Patient is returning call to discuss monitor results. 

## 2021-01-10 NOTE — Telephone Encounter (Signed)
Can you give the patient a call when we get a chance to let her know that they have been trying to reach out.

## 2021-01-10 NOTE — Telephone Encounter (Signed)
Pt updated and verbalized understanding.   Lewayne Bunting, MD  01/10/2021 10:06 AM EST     Brief PAT; continue coreg at present dose. [name]

## 2021-01-11 NOTE — Telephone Encounter (Signed)
Called pt and informed her that they have been trying to reach her about the CT. She stated that she has been experiencing problems with her phone.

## 2021-01-12 LAB — BASIC METABOLIC PANEL
BUN: 9 mg/dL (ref 7–25)
CO2: 31 mmol/L (ref 20–32)
Calcium: 9.9 mg/dL (ref 8.6–10.4)
Chloride: 101 mmol/L (ref 98–110)
Creat: 0.94 mg/dL (ref 0.60–1.00)
Glucose, Bld: 83 mg/dL (ref 65–139)
Potassium: 4.6 mmol/L (ref 3.5–5.3)
Sodium: 141 mmol/L (ref 135–146)

## 2021-01-15 ENCOUNTER — Encounter: Payer: Self-pay | Admitting: *Deleted

## 2021-01-22 ENCOUNTER — Telehealth: Payer: Self-pay | Admitting: *Deleted

## 2021-01-22 NOTE — Telephone Encounter (Addendum)
Tur[place] with [facility] called and wanted  to inform Dr. Linford Arnold about Ms. Blanchet. She stated that she has dropped in her weight. She is currently 238 lbs she was 250 lbs.  Her parameters were set at 260-280. She asked that Dr. Linford Arnold call her to update/change these. She has a f/u appointment with Dr. Linford Arnold on 1/19  Sending to pcp for advice

## 2021-01-23 NOTE — Telephone Encounter (Signed)
Okay, I know she has been working on losing weight in addition to just the water weight.  How is her blood pressure doing.  We may need to decrease her Lasix I believe she is alternating 1mg  and 0.5mg .  If blood pressures are running lower than 110 systolic then have her decrease to just a half a tab of furosemide daily.  When she gets low or runs out let me know and I can switch her to the 20 mg instead of the 40 mg.  Believes she supposed to have an appointment at the end of the month with me so that will help to make sure that we are on the right track

## 2021-01-24 NOTE — Telephone Encounter (Signed)
Attempted to contact [place].  Phone number provided is incorrect.  Charyl Bigger, CMA

## 2021-01-28 NOTE — Telephone Encounter (Signed)
Attempted to contact pt.  No answer and no voicemail.  Charyl Bigger, CMA

## 2021-01-29 NOTE — Telephone Encounter (Signed)
Attempted to contact pt.  No answer and no voicemail.  T. Jaime Grizzell, CMA °

## 2021-01-30 ENCOUNTER — Telehealth (HOSPITAL_COMMUNITY): Payer: Self-pay | Admitting: Emergency Medicine

## 2021-01-30 NOTE — Telephone Encounter (Signed)
Attempted to call patient regarding upcoming cardiac CT appointment. °Left message on voicemail with name and callback number °Engelbert Sevin RN Navigator Cardiac Imaging °[facility] °[phone] Office °[phone] Cell ° °

## 2021-01-31 ENCOUNTER — Ambulatory Visit (INDEPENDENT_AMBULATORY_CARE_PROVIDER_SITE_OTHER): Payer: Medicare Other | Admitting: Family Medicine

## 2021-01-31 ENCOUNTER — Other Ambulatory Visit: Payer: Self-pay

## 2021-01-31 ENCOUNTER — Telehealth (HOSPITAL_COMMUNITY): Payer: Self-pay | Admitting: *Deleted

## 2021-01-31 ENCOUNTER — Encounter: Payer: Self-pay | Admitting: Family Medicine

## 2021-01-31 VITALS — BP 136/89 | HR 100 | Ht 66.0 in | Wt 236.0 lb

## 2021-01-31 DIAGNOSIS — I10 Essential (primary) hypertension: Secondary | ICD-10-CM

## 2021-01-31 DIAGNOSIS — I5033 Acute on chronic diastolic (congestive) heart failure: Secondary | ICD-10-CM

## 2021-01-31 DIAGNOSIS — J4531 Mild persistent asthma with (acute) exacerbation: Secondary | ICD-10-CM

## 2021-01-31 MED ORDER — ALBUTEROL SULFATE HFA 108 (90 BASE) MCG/ACT IN AERS: 2.0000 | INHALATION_SPRAY | RESPIRATORY_TRACT | 5 refills | Status: DC | PRN

## 2021-01-31 NOTE — Progress Notes (Addendum)
Established Patient Office Visit  Subjective:  Patient ID: Briana Silva, female    DOB: 09-22-1945  Age: 76 y.o. MRN: [id number]  CC:  Chief Complaint  Patient presents with   Follow-up    HPI Briana Silva presents for 1 month follow-up for heart failure.  Follow-up of hypertension.  She was having hypotension when I last saw her after recent hospitalization where she had an acute on chronic diastolic heart failure episode.  They did add spironolactone to her regimen so we had discussed adjusting her Lasix.  She cut down to 1 tab every other day and a half a tab on days in between.  For her heart failure she was currently on Farxiga, spironolactone, Lasix, losartan, carvedilol.  The home health nurse, [place] with [facility] had called about 2 weeks ago letting us know that her weight had dropped down to about 2 and 38 pounds.  We have tried to reach back out multiple times to adjust her diuretic but were unable to get in touch with the representative from [facility] or the patient herself.  Is really getting her strength back and is now able to use a cane instead of a walker she said she actually drove herself here which is the first time she has been out of the house by herself since she was in the hospital.  Past Medical History:  Diagnosis Date   Abnormal echocardiogram 09/18/2016   Mild LVH, EF 55-65%, moderately dilated LA and RA   Allergy    Asthma    Asthma    Atrial tachycardia (HCC)    Cardiomyopathy ([place])    CHF (congestive heart failure) ([place])    Depression    Hyperlipidemia    Hypertension    Obesity     Past Surgical History:  Procedure Laterality Date   LEFT OOPHORECTOMY  1967   TUBAL LIGATION  1967    Family History  Problem Relation Age of Onset   Hypertension Mother    Alzheimer's disease Mother    Cancer Father    Pancreatic cancer Father    Hypertension Father    Other Sister        brain tumor   Diabetes Brother    Non-Hodgkin's lymphoma Brother     Leukemia Brother    Cancer Daughter 57       Brain tumor    Social History   Socioeconomic History   Marital status: Widowed    Spouse name: Not on file   Number of children: 2   Years of education: 14   Highest education level: Associate degree: academic program  Occupational History   Occupation: Retired.   Tobacco Use   Smoking status: Never   Smokeless tobacco: Never  Substance and Sexual Activity   Alcohol use: No   Drug use: No   Sexual activity: Not on file  Other Topics Concern   Not on file  Social History Narrative   Lives alone. She had two children, one has decreased. She enjoys writing and doing puzzles. She did get a new puppy recently.   Social Determinants of Health   Financial Resource Strain: Low Risk    Difficulty of Paying Living Expenses: Not hard at all  Food Insecurity: No Food Insecurity   Worried About Charity fundraiser in the Last Year: Never true   [place] in the Last Year: Never true  Transportation Needs: No Transportation Needs   Lack of Transportation (Medical): No  Lack of Transportation (Non-Medical): No  Physical Activity: Inactive   Days of Exercise per Week: 0 days   Minutes of Exercise per Session: 0 min  Stress: No Stress Concern Present   Feeling of Stress : Not at all  Social Connections: Socially Isolated   Frequency of Communication with Friends and Family: More than three times a week   Frequency of Social Gatherings with Friends and Family: Once a week   Attends Religious Services: Never   Marine scientist or Organizations: No   Attends Archivist Meetings: Never   Marital Status: Widowed  Human resources officer Violence: Not At Risk   Fear of Current or Ex-Partner: No   Emotionally Abused: No   Physically Abused: No   Sexually Abused: No    Outpatient Medications Prior to Visit  Medication Sig Dispense Refill   albuterol (PROVENTIL) (2.5 MG/3ML) 0.083% nebulizer solution Take 3 mLs (2.5 mg  total) by nebulization every 4 (four) hours as needed for wheezing or shortness of breath. Dx:J45.41 75 mL 6   aspirin EC 81 MG tablet Take 81 mg by mouth daily. Swallow whole.     carvedilol (COREG) 12.5 MG tablet Take 12.5 mg by mouth 2 (two) times daily with a meal.     cetirizine (ZYRTEC) 10 MG tablet Take 1 tablet (10 mg total) by mouth daily as needed for allergies or rhinitis. 30 tablet 5   Cholecalciferol (QC VITAMIN D3) 50 MCG (2000 UT) TABS Take 1 tablet by mouth daily.     dapagliflozin propanediol (FARXIGA) 10 MG TABS tablet Take 10 mg by mouth daily.     ferrous sulfate 324 MG TBEC Take 324 mg by mouth.     fluticasone-salmeterol (ADVAIR) 250-50 MCG/ACT AEPB INHALE 1 DOSE BY MOUTH INTO THE LUNGS TWICE DAILY 180 each 3   furosemide (LASIX) 40 MG tablet Take 1 tablet (40 mg total) by mouth daily. 90 tablet 1   losartan (COZAAR) 25 MG tablet Take 1 tablet (25 mg total) by mouth daily. 90 tablet 3   Multiple Vitamins-Minerals (MULTIVITAMIN WITH MINERALS) tablet Take 1 tablet by mouth daily.     Omega-3 Fatty Acids (FISH OIL) 1000 MG CAPS Take 1 capsule by mouth daily.     spironolactone (ALDACTONE) 25 MG tablet Take 0.5 tablets (12.5 mg total) by mouth daily. 45 tablet 3   albuterol (PROAIR HFA) 108 (90 Base) MCG/ACT inhaler Inhale 2 puffs into the lungs every 4 (four) hours as needed for wheezing or shortness of breath. 6.7 g 5   No facility-administered medications prior to visit.    Allergies  Allergen Reactions   Metoprolol Swelling    Other reaction(s): Other (See Comments) Severe fatigue, "tightness around my neck", severe leg weakness    Dust Mite Mixed Allergen Ext [Mite (D. Farinae)]     ROS Review of Systems    Objective:    Physical Exam Constitutional:      Appearance: Normal appearance. She is well-developed.  HENT:     Head: Normocephalic and atraumatic.  Cardiovascular:     Rate and Rhythm: Normal rate and regular rhythm.     Heart sounds: Normal heart  sounds.  Pulmonary:     Effort: Pulmonary effort is normal.     Breath sounds: Normal breath sounds.  Skin:    General: Skin is warm and dry.  Neurological:     Mental Status: She is alert and oriented to person, place, and time.  Psychiatric:  Behavior: Behavior normal.    BP 136/89    Pulse 100    Ht 5' 6"  (1.676 m)    Wt 236 lb (107 kg)    SpO2 100%    BMI 38.09 kg/m  Wt Readings from Last 3 Encounters:  01/31/21 236 lb (107 kg)  01/03/21 243 lb (110.2 kg)  12/26/20 253 lb (114.8 kg)     Health Maintenance Due  Topic Date Due   COVID-19 Vaccine (5 - Booster for Moderna series) 01/28/2020     There are no preventive care reminders to display for this patient.  Lab Results  Component Value Date   TSH 1.518 01/17/2015   Lab Results  Component Value Date   WBC 5.4 06/20/2019   HGB 13.8 06/20/2019   HCT 42.5 06/20/2019   MCV 90.2 06/20/2019   PLT 273 06/20/2019   Lab Results  Component Value Date   NA 141 01/31/2021   K 4.5 01/31/2021   CO2 32 01/31/2021   GLUCOSE 84 01/31/2021   BUN 15 01/31/2021   CREATININE 0.89 01/31/2021   BILITOT 0.8 03/22/2020   ALKPHOS 76 06/25/2015   AST 20 03/22/2020   ALT 24 03/22/2020   PROT 7.3 03/22/2020   ALBUMIN 3.8 06/25/2015   CALCIUM 9.9 01/31/2021   EGFR 67 01/31/2021   Lab Results  Component Value Date   CHOL 233 (H) 06/20/2019   Lab Results  Component Value Date   HDL 81 06/20/2019   Lab Results  Component Value Date   LDLCALC 137 (H) 06/20/2019   Lab Results  Component Value Date   TRIG 48 06/20/2019   Lab Results  Component Value Date   CHOLHDL 2.9 06/20/2019   Lab Results  Component Value Date   HGBA1C 5.5 06/20/2019      Assessment & Plan:   Problem List Items Addressed This Visit       Cardiovascular and Mediastinum   Essential hypertension    Well controlled. Continue current regimen. Follow up in  6 mo       Relevant Orders   BASIC METABOLIC PANEL WITH GFR (Completed)    Acute on chronic diastolic (congestive) heart failure ([place]) - Primary    She is actually doing really well overall.  Reports home blood pressures have been running in the 120s to low 130s.  She is down to 236 pounds.  Would like to see if we could hopefully decrease her Lasix down to 20 mg daily she is currently alternating between 40 and 20.  With the significant weight loss I suspect she may not need as much diuretic.  Also with the addition of the [place] and spironolactone.  We will check renal function today.      Relevant Orders   BASIC METABOLIC PANEL WITH GFR (Completed)     Other   Severe obesity (BMI 35.0-39.9) with comorbidity ([place])    She is really doing fantastic and trying to do better with lifestyle changes.       Plan to follow back up in 1 month.  Disorder make sure that if she is continuing to lose weight that we make any adjustments needed.  Do think we have some wiggle room on her losartan to potentially increase her dose based on her blood pressure reading today.  No orders of the defined types were placed in this encounter.   Follow-up: Return in about 4 weeks (around 02/28/2021) for BP and heart check .    [name], MD

## 2021-01-31 NOTE — Assessment & Plan Note (Signed)
She is really doing fantastic and trying to do better with lifestyle changes.

## 2021-01-31 NOTE — Assessment & Plan Note (Signed)
Well controlled. Continue current regimen. Follow up in  6 mo  

## 2021-01-31 NOTE — Telephone Encounter (Signed)
Patient was seen in office today with PCP.

## 2021-01-31 NOTE — Assessment & Plan Note (Addendum)
She is actually doing really well overall.  Reports home blood pressures have been running in the 120s to low 130s.  She is down to 236 pounds.  Would like to see if we could hopefully decrease her Lasix down to 20 mg daily she is currently alternating between 40 and 20.  With the significant weight loss I suspect she may not need as much diuretic.  Also with the addition of the [place] and spironolactone.  We will check renal function today.

## 2021-01-31 NOTE — Telephone Encounter (Signed)
Reaching out to patient to offer assistance regarding upcoming cardiac imaging study; pt verbalizes understanding of appt date/time, parking situation and where to check in, pre-test NPO status and medications ordered; name and call back number provided for further questions should they arise  [name] RN Navigator Cardiac Imaging [facility] [phone] office [phone] cell  Patient to take 100mg  metoprolol tartrate two hours prior to cardaic CT scan. She is aware to arrive at 9am for her 9:30am scan.

## 2021-02-01 ENCOUNTER — Ambulatory Visit (HOSPITAL_COMMUNITY): Payer: Medicare Other

## 2021-02-01 ENCOUNTER — Ambulatory Visit (HOSPITAL_COMMUNITY)
Admission: RE | Admit: 2021-02-01 | Discharge: 2021-02-01 | Disposition: A | Discharge status: Home / Self Care | Payer: Medicare Other | Source: Ambulatory Visit | Attending: Cardiology | Admitting: Cardiology

## 2021-02-01 DIAGNOSIS — I429 Cardiomyopathy, unspecified: Secondary | ICD-10-CM

## 2021-02-01 LAB — BASIC METABOLIC PANEL WITH GFR
BUN: 15 mg/dL (ref 7–25)
CO2: 32 mmol/L (ref 20–32)
Calcium: 9.9 mg/dL (ref 8.6–10.4)
Chloride: 101 mmol/L (ref 98–110)
Creat: 0.89 mg/dL (ref 0.60–1.00)
Glucose, Bld: 84 mg/dL (ref 65–99)
Potassium: 4.5 mmol/L (ref 3.5–5.3)
Sodium: 141 mmol/L (ref 135–146)
eGFR: 67 mL/min/{1.73_m2} (ref >=60)

## 2021-02-01 MED ORDER — NITROGLYCERIN 0.4 MG SL SUBL
0.8000 mg | SUBLINGUAL_TABLET | Freq: Once | SUBLINGUAL | Status: AC
  Administered 2021-02-01: 0.8 mg via SUBLINGUAL

## 2021-02-01 MED ORDER — DILTIAZEM HCL 25 MG/5ML IV SOLN
INTRAVENOUS | Status: AC
  Filled 2021-02-01: qty 5

## 2021-02-01 MED ORDER — IOHEXOL 350 MG/ML SOLN
100.0000 mL | Freq: Once | INTRAVENOUS | Status: AC | PRN
  Administered 2021-02-01: 100 mL via INTRAVENOUS

## 2021-02-01 MED ORDER — DILTIAZEM HCL 25 MG/5ML IV SOLN
5.0000 mg | Freq: Once | INTRAVENOUS | Status: AC
  Administered 2021-02-01: 5 mg via INTRAVENOUS

## 2021-02-01 NOTE — Progress Notes (Signed)
Labs look great.

## 2021-02-04 ENCOUNTER — Telehealth: Payer: Self-pay | Admitting: Family Medicine

## 2021-02-04 NOTE — Telephone Encounter (Signed)
Please call patient: We received a note from cardiology wanting Briana Silva to address possible findings of cirrhosis on her recent scan.  I like to get her set up for an ultrasound with elastography specifically of the liver this will give Briana Silva a slightly different view to see what is going on if she is okay with that then please let me know and we will get that scheduled.

## 2021-02-06 NOTE — Telephone Encounter (Signed)
Attempted to contact patient twice regarding labs and message. No answer/voicemail.

## 2021-02-06 NOTE — Progress Notes (Signed)
Attempted to contact patient. Unable to leave message/No voicemail. Labs mailed to patient.

## 2021-02-08 ENCOUNTER — Encounter: Payer: Self-pay | Admitting: *Deleted

## 2021-02-08 NOTE — Telephone Encounter (Signed)
Task completed. Patient is agreeable with provider's referral recommendation. Patient informed that the imaging dept will contact her for scheduling once referral request is completed.

## 2021-02-28 ENCOUNTER — Ambulatory Visit (INDEPENDENT_AMBULATORY_CARE_PROVIDER_SITE_OTHER): Payer: Medicare Other | Admitting: Family Medicine

## 2021-02-28 ENCOUNTER — Encounter: Payer: Self-pay | Admitting: Family Medicine

## 2021-02-28 ENCOUNTER — Other Ambulatory Visit: Payer: Self-pay

## 2021-02-28 VITALS — BP 111/75 | HR 83 | Resp 16 | Ht 66.0 in | Wt 232.0 lb

## 2021-02-28 DIAGNOSIS — R7301 Impaired fasting glucose: Secondary | ICD-10-CM | POA: Diagnosis not present

## 2021-02-28 DIAGNOSIS — R609 Edema, unspecified: Secondary | ICD-10-CM

## 2021-02-28 DIAGNOSIS — I10 Essential (primary) hypertension: Secondary | ICD-10-CM | POA: Diagnosis not present

## 2021-02-28 DIAGNOSIS — K760 Fatty (change of) liver, not elsewhere classified: Secondary | ICD-10-CM | POA: Diagnosis not present

## 2021-02-28 DIAGNOSIS — J4530 Mild persistent asthma, uncomplicated: Secondary | ICD-10-CM

## 2021-02-28 DIAGNOSIS — I5032 Chronic diastolic (congestive) heart failure: Secondary | ICD-10-CM | POA: Diagnosis not present

## 2021-02-28 MED ORDER — FUROSEMIDE 20 MG PO TABS: 20.0000 mg | ORAL_TABLET | Freq: Every day | ORAL | 3 refills | Status: DC

## 2021-02-28 NOTE — Progress Notes (Signed)
Established Patient Office Visit  Subjective:  Patient ID: Briana Silva, female    DOB: 1945-04-05  Age: 76 y.o. MRN: [id number]  CC:  Chief Complaint  Patient presents with   Hypertension    Follow up    Discuss CT Results     HPI Briana Silva presents for   Follow-up hypertension-after recent hospitalization for acute heart failure she was actually having some low blood pressures.  We decreased her Lasix to 20 mg daily.  Follow-up heart failure -overall she is doing really well she is been taking her medications consistently.  She is taking 40 mg of furosemide every other day.  She says she just got a refill on the medication.  She still continuing to work on Dealer.  He did want to go over the notation about the liver on the CT angio chest scan that she had done at [facility].  Notation from CT as below.  "UPPER ABDOMEN: Evaluation of the upper abdomen is limited by the arterial phase. The visualized hepatic parenchyma is diffusely low in attenuation, suggesting underlying hepatic steatosis. A small hiatal hernia is present."   Past Medical History:  Diagnosis Date   Abnormal echocardiogram 09/18/2016   Mild LVH, EF 55-65%, moderately dilated LA and RA   Allergy    Asthma    Asthma    Atrial tachycardia (HCC)    Cardiomyopathy (HCC)    CHF (congestive heart failure) ([place])    Depression    Hyperlipidemia    Hypertension    Obesity     Past Surgical History:  Procedure Laterality Date   LEFT OOPHORECTOMY  1967   TUBAL LIGATION  1967    Family History  Problem Relation Age of Onset   Hypertension Mother    Alzheimer's disease Mother    Cancer Father    Pancreatic cancer Father    Hypertension Father    Other Sister        brain tumor   Diabetes Brother    Non-Hodgkin's lymphoma Brother    Leukemia Brother    Cancer Daughter 94       Brain tumor    Social History   Socioeconomic History   Marital status: Widowed    Spouse name: Not on  file   Number of children: 2   Years of education: 14   Highest education level: Associate degree: academic program  Occupational History   Occupation: Retired.   Tobacco Use   Smoking status: Never   Smokeless tobacco: Never  Substance and Sexual Activity   Alcohol use: No   Drug use: No   Sexual activity: Not on file  Other Topics Concern   Not on file  Social History Narrative   Lives alone. She had two children, one has decreased. She enjoys writing and doing puzzles. She did get a new puppy recently.   Social Determinants of Health   Financial Resource Strain: Low Risk    Difficulty of Paying Living Expenses: Not hard at all  Food Insecurity: No Food Insecurity   Worried About Charity fundraiser in the Last Year: Never true   [place] in the Last Year: Never true  Transportation Needs: No Transportation Needs   Lack of Transportation (Medical): No   Lack of Transportation (Non-Medical): No  Physical Activity: Inactive   Days of Exercise per Week: 0 days   Minutes of Exercise per Session: 0 min  Stress: No Stress Concern Present  Feeling of Stress : Not at all  Social Connections: Socially Isolated   Frequency of Communication with Friends and Family: More than three times a week   Frequency of Social Gatherings with Friends and Family: Once a week   Attends Religious Services: Never   Marine scientist or Organizations: No   Attends Archivist Meetings: Never   Marital Status: Widowed  Human resources officer Violence: Not At Risk   Fear of Current or Ex-Partner: No   Emotionally Abused: No   Physically Abused: No   Sexually Abused: No    Outpatient Medications Prior to Visit  Medication Sig Dispense Refill   albuterol (PROAIR HFA) 108 (90 Base) MCG/ACT inhaler Inhale 2 puffs into the lungs every 4 (four) hours as needed for wheezing or shortness of breath. 6.7 g 5   albuterol (PROVENTIL) (2.5 MG/3ML) 0.083% nebulizer solution Take 3 mLs (2.5 mg  total) by nebulization every 4 (four) hours as needed for wheezing or shortness of breath. Dx:J45.41 75 mL 6   aspirin EC 81 MG tablet Take 81 mg by mouth daily. Swallow whole.     carvedilol (COREG) 12.5 MG tablet Take 12.5 mg by mouth 2 (two) times daily with a meal.     cetirizine (ZYRTEC) 10 MG tablet Take 1 tablet (10 mg total) by mouth daily as needed for allergies or rhinitis. 30 tablet 5   Cholecalciferol (QC VITAMIN D3) 50 MCG (2000 UT) TABS Take 1 tablet by mouth daily.     dapagliflozin propanediol (FARXIGA) 10 MG TABS tablet Take 10 mg by mouth daily.     ferrous sulfate 324 MG TBEC Take 324 mg by mouth.     fluticasone-salmeterol (ADVAIR) 250-50 MCG/ACT AEPB INHALE 1 DOSE BY MOUTH INTO THE LUNGS TWICE DAILY 180 each 3   losartan (COZAAR) 25 MG tablet Take 1 tablet (25 mg total) by mouth daily. 90 tablet 3   Multiple Vitamins-Minerals (MULTIVITAMIN WITH MINERALS) tablet Take 1 tablet by mouth daily.     Omega-3 Fatty Acids (FISH OIL) 1000 MG CAPS Take 1 capsule by mouth daily.     spironolactone (ALDACTONE) 25 MG tablet Take 0.5 tablets (12.5 mg total) by mouth daily. 45 tablet 3   furosemide (LASIX) 40 MG tablet Take 1 tablet (40 mg total) by mouth daily. 90 tablet 1   No facility-administered medications prior to visit.    Allergies  Allergen Reactions   Metoprolol Swelling    Other reaction(s): Other (See Comments) Severe fatigue, "tightness around my neck", severe leg weakness    Dust Mite Mixed Allergen Ext [Mite (D. Farinae)]     ROS Review of Systems    Objective:    Physical Exam Constitutional:      Appearance: Normal appearance. She is well-developed.  HENT:     Head: Normocephalic and atraumatic.  Cardiovascular:     Rate and Rhythm: Normal rate and regular rhythm.     Heart sounds: Normal heart sounds.  Pulmonary:     Effort: Pulmonary effort is normal.     Breath sounds: Normal breath sounds.  Musculoskeletal:        General: No swelling.      Comments: No LE edema.   Skin:    General: Skin is warm and dry.  Neurological:     Mental Status: She is alert and oriented to person, place, and time.  Psychiatric:        Behavior: Behavior normal.    BP 111/75  Pulse 83    Resp 16    Ht 5' 6"  (1.676 m)    Wt 232 lb (105.2 kg)    SpO2 93%    BMI 37.45 kg/m  Wt Readings from Last 3 Encounters:  02/28/21 232 lb (105.2 kg)  01/31/21 236 lb (107 kg)  01/03/21 243 lb (110.2 kg)     There are no preventive care reminders to display for this patient.   There are no preventive care reminders to display for this patient.  Lab Results  Component Value Date   TSH 1.518 01/17/2015   Lab Results  Component Value Date   WBC 5.4 06/20/2019   HGB 13.8 06/20/2019   HCT 42.5 06/20/2019   MCV 90.2 06/20/2019   PLT 273 06/20/2019   Lab Results  Component Value Date   NA 141 01/31/2021   K 4.5 01/31/2021   CO2 32 01/31/2021   GLUCOSE 84 01/31/2021   BUN 15 01/31/2021   CREATININE 0.89 01/31/2021   BILITOT 0.8 03/22/2020   ALKPHOS 76 06/25/2015   AST 20 03/22/2020   ALT 24 03/22/2020   PROT 7.3 03/22/2020   ALBUMIN 3.8 06/25/2015   CALCIUM 9.9 01/31/2021   EGFR 67 01/31/2021   Lab Results  Component Value Date   CHOL 233 (H) 06/20/2019   Lab Results  Component Value Date   HDL 81 06/20/2019   Lab Results  Component Value Date   LDLCALC 137 (H) 06/20/2019   Lab Results  Component Value Date   TRIG 48 06/20/2019   Lab Results  Component Value Date   CHOLHDL 2.9 06/20/2019   Lab Results  Component Value Date   HGBA1C 5.5 06/20/2019      Assessment & Plan:   Problem List Items Addressed This Visit       Cardiovascular and Mediastinum   Essential hypertension - Primary    Pressure looks absolutely fantastic and she is not feeling symptomatic.  Continue current regimen though I did suggest taking 20 mg of Lasix daily instead of 40 mg every other day.      Relevant Medications   furosemide (LASIX) 20  MG tablet   Other Relevant Orders   BASIC METABOLIC PANEL WITH GFR   Congestive heart failure (HCC)    Adjust Lasix to 20 mg daily instead of 40 mg every other day.  No significant edema on exam.  No sign of volume overload.  Continue ARB, Farxiga, spironolactone, carvedilol.      Relevant Medications   furosemide (LASIX) 20 MG tablet   Other Relevant Orders   BASIC METABOLIC PANEL WITH GFR     Respiratory   Mild persistent asthma without complication     Digestive   Fatty liver    CT imaging of chest indicated possible fatty liver.  We discussed that this is typically followed by ultrasound.  And that the main treatment is eating healthy, and weight loss which she is currently working on and doing a Chief Technology Officer job with.  Consider repeat ultrasound in 1 to 2 years.  We will follow-up on that pulmonary nodule in May and will likely get some view of the liver at that point as well.        Endocrine   IFG (impaired fasting glucose)     Other   Peripheral edema   Relevant Medications   furosemide (LASIX) 20 MG tablet    Since we are still adjusting her Lasix I do want a recheck a BMP today and  if everything looks great then plan to recheck again at follow-up in 3 months.  Meds ordered this encounter  Medications   furosemide (LASIX) 20 MG tablet    Sig: Take 1 tablet (20 mg total) by mouth daily.    Dispense:  90 tablet    Refill:  3    Pls d/c the 9m dose. Ok to place on file    Follow-up: Return in about 3 months (around 05/28/2021) for Hypertension.    CBeatrice Lecher MD

## 2021-02-28 NOTE — Patient Instructions (Signed)
Take half a tab of your furosemide daily.

## 2021-02-28 NOTE — Assessment & Plan Note (Signed)
Adjust Lasix to 20 mg daily instead of 40 mg every other day.  No significant edema on exam.  No sign of volume overload.  Continue ARB, Farxiga, spironolactone, carvedilol.

## 2021-02-28 NOTE — Assessment & Plan Note (Signed)
Pressure looks absolutely fantastic and she is not feeling symptomatic.  Continue current regimen though I did suggest taking 20 mg of Lasix daily instead of 40 mg every other day.

## 2021-02-28 NOTE — Assessment & Plan Note (Signed)
CT imaging of chest indicated possible fatty liver.  We discussed that this is typically followed by ultrasound.  And that the main treatment is eating healthy, and weight loss which she is currently working on and doing a Insurance account manager job with.  Consider repeat ultrasound in 1 to 2 years.  We will follow-up on that pulmonary nodule in May and will likely get some view of the liver at that point as well.

## 2021-03-01 LAB — BASIC METABOLIC PANEL WITH GFR
BUN/Creatinine Ratio: 17 (calc) (ref 6–22)
BUN: 17 mg/dL (ref 7–25)
CO2: 30 mmol/L (ref 20–32)
Calcium: 9.8 mg/dL (ref 8.6–10.4)
Chloride: 103 mmol/L (ref 98–110)
Creat: 1.01 mg/dL — ABNORMAL HIGH (ref 0.60–1.00)
Glucose, Bld: 91 mg/dL (ref 65–99)
Potassium: 4.4 mmol/L (ref 3.5–5.3)
Sodium: 141 mmol/L (ref 135–146)
eGFR: 57 mL/min/{1.73_m2} — ABNORMAL LOW (ref >=60)

## 2021-03-01 NOTE — Progress Notes (Signed)
Hi Briana Silva,  Kidney function shifted just slightly.  Not unexpected with the diuretics but it looks okay.  Again we will try doing 20 mg of furosemide daily.

## 2021-03-02 ENCOUNTER — Other Ambulatory Visit: Payer: Self-pay

## 2021-03-07 ENCOUNTER — Telehealth: Payer: Self-pay | Admitting: *Deleted

## 2021-03-07 NOTE — Chronic Care Management (AMB) (Signed)
°  Care Management   Note  03/07/2021 Name: Briana Silva MRN: [id number] DOB: [birth year]  Briana Silva is a 76 y.o. year old female who is a primary care patient of Agapito Games, MD and is actively engaged with the care management team. I reached out to Germaine Pomfret by phone today to assist with re-scheduling a follow up visit with the Pharmacist  Follow up plan: Spoke with pt who was driving and couldn't reschedule at this time and requested a call back in the next few days, aware that we would be cx 03/19/2021 appt with pharmacist - will call pt back at a later date   Burman Nieves, CCMA Care Guide, Embedded Care Coordination [facility]   Care Management  Direct Dial: [phone]

## 2021-03-12 NOTE — Progress Notes (Signed)
? ? ? ? ?HPI: FU CHF. Patient previously had congestive heart failure in [place]. She was told her heart was functioning at "30%". She was treated with medications with improvement. Apparently stress test normal but no records available. Echocardiogram January 2017 showed normal LV systolic function, mild left ventricular hypertrophy and biatrial enlargement. Previously seen at [facility] and reduced LV function felt possibly related to atrial tachycardia. Echo 11/22 showed EF 35-40, mild RAE, moderate MR, mild to moderate TR. CTA 12/22 showed no pulmonary embolus; nodule at major fissure and FU recommended 6 months. Nuclear study 12/22 showed EF 38 and no ischemia.  Monitor December 2022 showed sinus rhythm with occasional PAC, short burst of PAT, occasional PVC and rare couplet.  Coronary CTA January 2023 showed calcium score 0; study limited in distal right coronary artery, PDA and mid distal circumflex/ramus were not visualized.  Interpreted as could not rule out calcified plaque in the mid LAD.  Note there was pulmonary artery enlargement suggestive of pulmonary hypertension and findings suspicious for cirrhosis.  Since last seen, she denies dyspnea, chest pain, palpitations, syncope, pedal edema. ? ?Current Outpatient Medications  ?Medication Sig Dispense Refill  ? albuterol (PROAIR HFA) 108 (90 Base) MCG/ACT inhaler Inhale 2 puffs into the lungs every 4 (four) hours as needed for wheezing or shortness of breath. 6.7 g 5  ? albuterol (PROVENTIL) (2.5 MG/3ML) 0.083% nebulizer solution Take 3 mLs (2.5 mg total) by nebulization every 4 (four) hours as needed for wheezing or shortness of breath. Dx:J45.41 75 mL 6  ? aspirin EC 81 MG tablet Take 81 mg by mouth daily. Swallow whole.    ? carvedilol (COREG) 12.5 MG tablet Take 12.5 mg by mouth 2 (two) times daily with a meal.    ? cetirizine (ZYRTEC) 10 MG tablet Take 1 tablet (10 mg total) by mouth daily as needed for allergies or rhinitis. 30 tablet 5  ?  Cholecalciferol (QC VITAMIN D3) 50 MCG (2000 UT) TABS Take 1 tablet by mouth daily.    ? dapagliflozin propanediol (FARXIGA) 10 MG TABS tablet Take 10 mg by mouth daily.    ? ferrous sulfate 324 MG TBEC Take 324 mg by mouth.    ? fluticasone-salmeterol (ADVAIR) 250-50 MCG/ACT AEPB INHALE 1 DOSE BY MOUTH INTO THE LUNGS TWICE DAILY 180 each 3  ? furosemide (LASIX) 20 MG tablet Take 1 tablet (20 mg total) by mouth daily. 90 tablet 3  ? losartan (COZAAR) 25 MG tablet Take 1 tablet (25 mg total) by mouth daily. 90 tablet 3  ? Multiple Vitamins-Minerals (MULTIVITAMIN WITH MINERALS) tablet Take 1 tablet by mouth daily.    ? Omega-3 Fatty Acids (FISH OIL) 1000 MG CAPS Take 1 capsule by mouth daily.    ? spironolactone (ALDACTONE) 25 MG tablet Take 0.5 tablets (12.5 mg total) by mouth daily. 45 tablet 3  ? ?No current facility-administered medications for this visit.  ? ? ? ?Past Medical History:  ?Diagnosis Date  ? Abnormal echocardiogram 09/18/2016  ? Mild LVH, EF 55-65%, moderately dilated LA and RA  ? Allergy   ? Asthma   ? Asthma   ? Atrial tachycardia ([place])   ? Cardiomyopathy ([place])   ? CHF (congestive heart failure) ([place])   ? Depression   ? Hyperlipidemia   ? Hypertension   ? Obesity   ? ? ?Past Surgical History:  ?Procedure Laterality Date  ? LEFT OOPHORECTOMY  1967  ? TUBAL LIGATION  1967  ? ? ?Social History  ? ?  Socioeconomic History  ? Marital status: Widowed  ?  Spouse name: Not on file  ? Number of children: 2  ? Years of education: 38  ? Highest education level: Associate degree: academic program  ?Occupational History  ? Occupation: Retired.   ?Tobacco Use  ? Smoking status: Never  ? Smokeless tobacco: Never  ?Substance and Sexual Activity  ? Alcohol use: No  ? Drug use: No  ? Sexual activity: Not on file  ?Other Topics Concern  ? Not on file  ?Social History Narrative  ? Lives alone. She had two children, one has decreased. She enjoys writing and doing puzzles. She did get a new puppy recently.  ? ?Social  Determinants of Health  ? ?Financial Resource Strain: Low Risk   ? Difficulty of Paying Living Expenses: Not hard at all  ?Food Insecurity: No Food Insecurity  ? Worried About Charity fundraiser in the Last Year: Never true  ? Ran Out of Food in the Last Year: Never true  ?Transportation Needs: No Transportation Needs  ? Lack of Transportation (Medical): No  ? Lack of Transportation (Non-Medical): No  ?Physical Activity: Inactive  ? Days of Exercise per Week: 0 days  ? Minutes of Exercise per Session: 0 min  ?Stress: No Stress Concern Present  ? Feeling of Stress : Not at all  ?Social Connections: Socially Isolated  ? Frequency of Communication with Friends and Family: More than three times a week  ? Frequency of Social Gatherings with Friends and Family: Once a week  ? Attends Religious Services: Never  ? Active Member of Clubs or Organizations: No  ? Attends Archivist Meetings: Never  ? Marital Status: Widowed  ?Intimate Partner Violence: Not At Risk  ? Fear of Current or Ex-Partner: No  ? Emotionally Abused: No  ? Physically Abused: No  ? Sexually Abused: No  ? ? ?Family History  ?Problem Relation Age of Onset  ? Hypertension Mother   ? Alzheimer's disease Mother   ? Cancer Father   ? Pancreatic cancer Father   ? Hypertension Father   ? Other Sister   ?     brain tumor  ? Diabetes Brother   ? Non-Hodgkin's lymphoma Brother   ? Leukemia Brother   ? Cancer Daughter 66  ?     Brain tumor  ? ? ?ROS: no fevers or chills, productive cough, hemoptysis, dysphasia, odynophagia, melena, hematochezia, dysuria, hematuria, rash, seizure activity, orthopnea, PND, pedal edema, claudication. Remaining systems are negative. ? ?Physical Exam: ?Well-developed well-nourished in no acute distress.  ?Skin is warm and dry.  ?HEENT is normal.  ?Neck is supple.  ?Chest is clear to auscultation with normal expansion.  ?Cardiovascular exam is regular rate and rhythm.  ?Abdominal exam nontender or distended. No masses  palpated. ?Extremities show no edema. ?neuro grossly intact ? ?A/P ? ?1 nonischemic cardiomyopathy-cardiac CTA suboptimal but calcium score 0.  Previous nuclear study showed no ischemia.  Monitor is as outlined and not clear that atrial tachycardia is causing cardiomyopathy.  We will continue carvedilol at present dose.  Discontinue losartan and treat with Entresto 24/26 twice daily.  Check potassium and renal function in 1 week.  We will plan to repeat echocardiogram 3 months after medications titrated. ? ?2 atrial tachycardia-continue beta-blocker. ? ?3 chronic systolic congestive heart failure-continue diuretics at present dose.  Continue Farxiga. ? ?4 hypertension-patient's blood pressure is controlled.  Continue present medical regimen and follow. ? ?5 hyperlipidemia-continue statin. ? ?6 history  of pulmonary nodule-I again asked the patient to follow-up with primary care for this issue in the future. ? ?Kirk Ruths, MD ? ? ? ?

## 2021-03-19 ENCOUNTER — Telehealth: Payer: Medicare Other

## 2021-03-19 NOTE — Chronic Care Management (AMB) (Signed)
?  Care Management  ? ?Note ? ?03/19/2021 ?Name: Briana Silva MRN: [id number] DOB: [birth year] ? ?Briana Silva is a 76 y.o. year old female who is a primary care patient of Agapito Games, MD and is actively engaged with the care management team. I reached out to Germaine Pomfret by phone today to assist with re-scheduling a follow up visit with the Pharmacist ? ?Follow up plan: ?Telephone appointment with care management team member scheduled for: 07/08/2021 ? ?Donzella Carrol, CCMA ?Care Guide, Embedded Care Coordination ?[facility]  Care Management  ?Direct Dial: [phone] ? ? ?

## 2021-03-25 ENCOUNTER — Ambulatory Visit: Payer: Medicare Other | Admitting: Cardiology

## 2021-03-25 ENCOUNTER — Other Ambulatory Visit: Payer: Self-pay

## 2021-03-25 ENCOUNTER — Encounter: Payer: Self-pay | Admitting: Cardiology

## 2021-03-25 VITALS — BP 127/87 | HR 96 | Ht 66.0 in | Wt 234.0 lb

## 2021-03-25 DIAGNOSIS — I5042 Chronic combined systolic (congestive) and diastolic (congestive) heart failure: Secondary | ICD-10-CM

## 2021-03-25 DIAGNOSIS — I429 Cardiomyopathy, unspecified: Secondary | ICD-10-CM | POA: Diagnosis not present

## 2021-03-25 DIAGNOSIS — R609 Edema, unspecified: Secondary | ICD-10-CM

## 2021-03-25 DIAGNOSIS — I10 Essential (primary) hypertension: Secondary | ICD-10-CM

## 2021-03-25 DIAGNOSIS — E7800 Pure hypercholesterolemia, unspecified: Secondary | ICD-10-CM | POA: Diagnosis not present

## 2021-03-25 DIAGNOSIS — I5032 Chronic diastolic (congestive) heart failure: Secondary | ICD-10-CM

## 2021-03-25 DIAGNOSIS — I471 Supraventricular tachycardia: Secondary | ICD-10-CM

## 2021-03-25 MED ORDER — SPIRONOLACTONE 25 MG PO TABS: 12.5000 mg | ORAL_TABLET | Freq: Every day | ORAL | 3 refills | Status: DC

## 2021-03-25 MED ORDER — CARVEDILOL 12.5 MG PO TABS: 12.5000 mg | ORAL_TABLET | Freq: Two times a day (BID) | ORAL | 3 refills | Status: DC

## 2021-03-25 MED ORDER — FUROSEMIDE 20 MG PO TABS: 20.0000 mg | ORAL_TABLET | Freq: Every day | ORAL | 3 refills | Status: DC

## 2021-03-25 MED ORDER — DAPAGLIFLOZIN PROPANEDIOL 10 MG PO TABS: 10.0000 mg | ORAL_TABLET | Freq: Every day | ORAL | 12 refills | Status: DC

## 2021-03-25 MED ORDER — SACUBITRIL-VALSARTAN 24-26 MG PO TABS
1.0000 | ORAL_TABLET | Freq: Two times a day (BID) | ORAL | 11 refills | Status: DC
Start: 2021-03-25 — End: 2021-08-09

## 2021-03-25 NOTE — Patient Instructions (Signed)
Medication Instructions:  ? ?STOP LOSARTAN ? ?START ENTRESTO 24/26 MG ONE TABLET TWICE DAILY ? ?*If you need a refill on your cardiac medications before your next appointment, please call your pharmacy* ? ? ?Lab Work: ? ?Your physician recommends that you return for lab work in: ONE WEEK-DO NOT NEED TO FAST ? ?If you have labs (blood work) drawn today and your tests are completely normal, you will receive your results only by: ?MyChart Message (if you have MyChart) OR ?A paper copy in the mail ?If you have any lab test that is abnormal or we need to change your treatment, we will call you to review the results. ? ? ?Testing/Procedures: ? ?Your physician has requested that you have an echocardiogram. Echocardiography is a painless test that uses sound waves to create images of your heart. It provides your doctor with information about the size and shape of your heart and how well your heart?s chambers and valves are working. This procedure takes approximately one hour. There are no restrictions for this procedure. [place] OFFICE-SCHEDULE IN 3 MONTHS ? ? ?Follow-Up: ?At [facility], you and your health needs are our priority.  As part of our continuing mission to provide you with exceptional heart care, we have created designated Provider Care Teams.  These Care Teams include your primary Cardiologist (physician) and Advanced Practice Providers (APPs -  Physician Assistants and Nurse Practitioners) who all work together to provide you with the care you need, when you need it. ? ?We recommend signing up for the patient portal called "MyChart".  Sign up information is provided on this After Visit Summary.  MyChart is used to connect with patients for Virtual Visits (Telemedicine).  Patients are able to view lab/test results, encounter notes, upcoming appointments, etc.  Non-urgent messages can be sent to your provider as well.   ?To learn more about what you can do with MyChart, go to [email or web address].    ? ?Your next appointment:   ?6 month(s) ? ?The format for your next appointment:   ?In Person ? ?Provider:   ?[name], MD  ? ? ?

## 2021-05-29 ENCOUNTER — Ambulatory Visit: Payer: Medicare Other | Admitting: Family Medicine

## 2021-06-25 ENCOUNTER — Ambulatory Visit (HOSPITAL_BASED_OUTPATIENT_CLINIC_OR_DEPARTMENT_OTHER): Payer: Medicare Other

## 2021-07-08 ENCOUNTER — Telehealth: Payer: Medicare Other
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
## 2021-07-18 ENCOUNTER — Ambulatory Visit: Payer: Medicare Other | Admitting: Family Medicine

## 2021-07-18 NOTE — Progress Notes (Deleted)
   Established Patient Office Visit  Subjective   Patient ID: Briana Silva, female    DOB: [birth year]  Age: 76 y.o. MRN: [id number]  No chief complaint on file.   HPI  Hypertension- Pt denies chest pain, SOB, dizziness, or heart palpitations.  Taking meds as directed w/o problems.  Denies medication side effects.    Impaired fasting glucose-no increased thirst or urination. No symptoms consistent with hypoglycemia.  Possible cirrhosis seen on Cardiac CT.    CT angio from the fall performed at [facility] showed a 15 mm nodule at the right major fissure which is stable since prior imaging.  They did recommend repeat imaging in 6 months because of the size.  {History (Optional):23778}  ROS    Objective:     There were no vitals taken for this visit. {Vitals History (Optional):23777}  Physical Exam Vitals and nursing note reviewed.  Constitutional:      Appearance: She is well-developed.  HENT:     Head: Normocephalic and atraumatic.  Cardiovascular:     Rate and Rhythm: Normal rate and regular rhythm.     Heart sounds: Normal heart sounds.  Pulmonary:     Effort: Pulmonary effort is normal.     Breath sounds: Normal breath sounds.  Skin:    General: Skin is warm and dry.  Neurological:     Mental Status: She is alert and oriented to person, place, and time.  Psychiatric:        Behavior: Behavior normal.      No results found for any visits on 07/18/21.  {Labs (Optional):23779}  The 10-year ASCVD risk score (Arnett DK, et al., 2019) is: 24.1%    Assessment & Plan:   Problem List Items Addressed This Visit       Cardiovascular and Mediastinum   Essential hypertension - Primary   Acute on chronic diastolic (congestive) heart failure (HCC)    Currently on Farxiga, Entresto, and spironolactone.  Also on Lasix 20 mg daily she was able to taper off of alternating between 20 and 40.        Respiratory   Pulmonary nodules    Due for 23-month repeat to  follow-up on pulmonary nodule measuring approximately 15 mm.  They did recommend follow-up because of the size.        Endocrine   IFG (impaired fasting glucose)   Other Visit Diagnoses     Abnormal finding on imaging of liver       Solitary pulmonary nodule           No follow-ups on file.    Nani Gasser, MD

## 2021-07-18 NOTE — Assessment & Plan Note (Deleted)
Due for 54-month repeat to follow-up on pulmonary nodule measuring approximately 15 mm.  They did recommend follow-up because of the size.

## 2021-07-18 NOTE — Assessment & Plan Note (Deleted)
Currently on Farxiga, Entresto, and spironolactone.  Also on Lasix 20 mg daily she was able to taper off of alternating between 20 and 40.

## 2021-07-22 ENCOUNTER — Ambulatory Visit (HOSPITAL_BASED_OUTPATIENT_CLINIC_OR_DEPARTMENT_OTHER): Admission: RE | Admit: 2021-07-22 | Payer: Medicare Other | Source: Ambulatory Visit

## 2021-08-01 ENCOUNTER — Ambulatory Visit (INDEPENDENT_AMBULATORY_CARE_PROVIDER_SITE_OTHER): Payer: Medicare Other | Admitting: Pharmacist

## 2021-08-01 DIAGNOSIS — I5032 Chronic diastolic (congestive) heart failure: Secondary | ICD-10-CM

## 2021-08-01 DIAGNOSIS — I10 Essential (primary) hypertension: Secondary | ICD-10-CM

## 2021-08-01 DIAGNOSIS — E785 Hyperlipidemia, unspecified: Secondary | ICD-10-CM

## 2021-08-01 DIAGNOSIS — J4530 Mild persistent asthma, uncomplicated: Secondary | ICD-10-CM

## 2021-08-01 NOTE — Progress Notes (Signed)
Chronic Care Management Pharmacy Note  08/01/2021 Name:  Briana Silva MRN:  [id number] DOB:  Jul 18, 1945  Summary: addressed HTN, HLD, asthma. Feeling stressed about a recent move from [place].   Out of medication & not taking farxiga or entresto for ~1 month due to cost. Not taking/prescribed a statin.  Not checking BP on regular basis due to her moving logistics.  Spironolactone 12.52m not routinely on schedule.  Recommendations/Changes made from today's visit:   -Will provide paperwork for obtaining entresto and farxiga via cost assistance, available for Briana Silva to pick up at front desk, fill out, and return. She verbalized understanding. -Recommended repeat lipid panel at next PCP follow up and initiate rosuvastatin 553mdaily (titrated up as tolerated) if LDL remains >70.  Plan: f/u with pharmacist in 1 month  Subjective: Briana Silva an [age].o. year old female who is a primary patient of Metheney, CaRene KocherMD.  The CCM team was consulted for assistance with disease management and care coordination needs.    Engaged with patient by telephone for follow up visit in response to provider referral for pharmacy case management and/or care coordination services.   Consent to Services:  The patient was given information about Chronic Care Management services, agreed to services, and gave verbal consent prior to initiation of services.  Please see initial visit note for detailed documentation.   Patient Care Team: MeHali MarryMD as PCP - General (Family Medicine) [facility] Pharmacist (Pharmacist)  Recent office visits:  03/22/20- CaBeatrice LecherMD- seen for hospitalization follow up, referral to cardiology, no medication changes, follow up 6 months    Recent consult visits:  No visits noted    Hospital visits:  Medication Reconciliation was completed by comparing discharge summary, patient's EMR and Pharmacy list, and upon discussion  with patient.   Admitted to the hospital on 03/14/20 due to Acute on chronic combined systolic and diastolic CHF. [facility]20[facility]    [facility] Lisinopril 2.5 mg- one tab daily   All other medications remain the same after Hospital Discharge  Objective:  Lab Results  Component Value Date   CREATININE 1.01 (H) 02/28/2021   CREATININE 0.89 01/31/2021   CREATININE 0.94 01/11/2021    Lab Results  Component Value Date   HGBA1C 5.5 06/20/2019       Component Value Date/Time   CHOL 233 (H) 06/20/2019 0948   TRIG 48 06/20/2019 0948   HDL 81 06/20/2019 0948   CHOLHDL 2.9 06/20/2019 0948   VLDL 10 06/25/2015 1018   LDLCALC 137 (H) 06/20/2019 0948       Latest Ref Rng & Units 03/22/2020   10:47 AM 06/20/2019    9:48 AM 06/25/2015   10:18 AM  Hepatic Function  Total Protein 6.1 - 8.1 g/dL 7.3  7.4  7.2   Albumin 3.6 - 5.1 g/dL   3.8   AST 10 - 35 U/L 20  25  17    ALT 6 - 29 U/L 24  16  11    Alk Phosphatase 33 - 130 U/L   76   Total Bilirubin 0.2 - 1.2 mg/dL 0.8  0.7  0.6     Lab Results  Component Value Date/Time   TSH 1.518 01/17/2015 03:16 PM       Latest Ref Rng & Units 06/20/2019    9:48 AM  CBC  WBC 3.8 -  10.8 Thousand/uL 5.4   Hemoglobin 11.7 - 15.5 g/dL 13.8   Hematocrit 35.0 - 45.0 % 42.5   Platelets 140 - 400 Thousand/uL 273     No results found for: "VD25OH"  Clinical ASCVD: Yes  The 10-year ASCVD risk score (Arnett DK, et al., 2019) is: 24.1%   Values used to calculate the score:     Age: 81 years     Sex: Female     Is Non-Hispanic African American: Yes     Diabetic: No     Tobacco smoker: No     Systolic Blood Pressure: 010 mmHg     Is BP treated: Yes     HDL Cholesterol: 81 mg/dL     Total Cholesterol: 233 mg/dL    Social History   Tobacco Use  Smoking Status Never  Smokeless Tobacco Never   BP Readings from Last 3 Encounters:   03/25/21 127/87  02/28/21 111/75  02/01/21 106/72   Pulse Readings from Last 3 Encounters:  03/25/21 96  02/28/21 83  02/01/21 65   Wt Readings from Last 3 Encounters:  03/25/21 234 lb (106.1 kg)  02/28/21 232 lb (105.2 kg)  01/31/21 236 lb (107 kg)    Assessment: Review of patient past medical history, allergies, medications, health status, including review of consultants reports, laboratory and other test data, was performed as part of comprehensive evaluation and provision of chronic care management services.   SDOH:  (Social Determinants of Health) assessments and interventions performed:    CCM Care Plan  Allergies  Allergen Reactions   Metoprolol Swelling    Other reaction(s): Other (See Comments) Severe fatigue, "tightness around my neck", severe leg weakness    Dust Mite Mixed Allergen Ext [Mite (D. Farinae)]     Medications Reviewed Today     Reviewed by Lelon Perla, MD (Physician) on 03/25/21 at 1158  Med List Status: <None>   Medication Order Taking? Sig Documenting Provider Last Dose Status Informant  albuterol (PROAIR HFA) 108 (90 Base) MCG/ACT inhaler [id number] Yes Inhale 2 puffs into the lungs every 4 (four) hours as needed for wheezing or shortness of breath. Hali Marry, MD Taking Active   albuterol (PROVENTIL) (2.5 MG/3ML) 0.083% nebulizer solution [id number] Yes Take 3 mLs (2.5 mg total) by nebulization every 4 (four) hours as needed for wheezing or shortness of breath. Dx:J45.41 Hali Marry, MD Taking Active   aspirin EC 81 MG tablet [id number] Yes Take 81 mg by mouth daily. Swallow whole. [provider] Taking Active   carvedilol (COREG) 12.5 MG tablet [id number] Yes Take 12.5 mg by mouth 2 (two) times daily with a meal. [provider] Taking Active   cetirizine (ZYRTEC) 10 MG tablet [id number] Yes Take 1 tablet (10 mg total) by mouth daily as needed for allergies or rhinitis. Hali Marry, MD Taking  Active   Cholecalciferol (QC VITAMIN D3) 50 MCG (2000 UT) TABS [id number] Yes Take 1 tablet by mouth daily. [provider] Taking Active   dapagliflozin propanediol (FARXIGA) 10 MG TABS tablet [id number] Yes Take 10 mg by mouth daily. [provider] Taking Active   ferrous sulfate 324 MG TBEC [id number] Yes Take 324 mg by mouth. [provider] Taking Active   fluticasone-salmeterol (ADVAIR) 250-50 MCG/ACT AEPB [id number] Yes INHALE 1 DOSE BY MOUTH INTO THE LUNGS TWICE DAILY Hali Marry, MD Taking Active   furosemide (LASIX) 20 MG tablet [id number] Yes Take 1 tablet (20 mg total) by mouth daily. [place],  Rene Kocher, MD Taking Active   losartan (COZAAR) 25 MG tablet [id number] Yes Take 1 tablet (25 mg total) by mouth daily. Lelon Perla, MD Taking Active   Multiple Vitamins-Minerals (MULTIVITAMIN WITH MINERALS) tablet [id number] Yes Take 1 tablet by mouth daily. [provider] Taking Active   Omega-3 Fatty Acids (FISH OIL) 1000 MG CAPS [id number] Yes Take 1 capsule by mouth daily. [provider] Taking Active   spironolactone (ALDACTONE) 25 MG tablet [id number] Yes Take 0.5 tablets (12.5 mg total) by mouth daily. Lelon Perla, MD Taking Active             Patient Active Problem List   Diagnosis Date Noted   Fatty liver 02/28/2021   Pulmonary nodules 01/03/2021   Acute on chronic diastolic (congestive) heart failure ([place]) 03/23/2017   Mild persistent asthma without complication 24/09/7351   Peripheral edema 09/18/2016   Tachycardia with heart rate 121-140 beats per minute 09/18/2016   Abnormal echocardiogram 09/18/2016   IFG (impaired fasting glucose) 06/27/2015   Hearing loss in left ear 02/08/2013   Osteopenia 06/23/2011   Dietary counseling and surveillance 10/17/2010   OSA (obstructive sleep apnea) 08/08/2010   Hyperlipidemia 07/10/2009   DEPRESSION, MILD 07/10/2009   Severe obesity (BMI 35.0-39.9) with comorbidity ([place])  12/15/2008   OTHER SPECIFIED FORMS OF HEARING LOSS 12/15/2008   Congestive heart failure ([place]) 12/15/2008   ALLERGIC RHINITIS CAUSE UNSPECIFIED 12/15/2008   POSTMENOPAUSAL STATUS 12/15/2008   Essential hypertension 10/03/2008   Asthma, extrinsic 10/03/2008    Immunization History  Administered Date(s) Administered   Fluad Quad(high Dose 65+) 12/03/2019, 12/20/2020   Influenza Split 10/18/2010, 09/26/2011   Influenza, High Dose Seasonal PF 09/25/2016, 12/01/2017   Influenza,inj,Quad PF,6+ Mos 01/10/2013, 09/08/2013, 09/22/2014, 09/27/2015   Moderna Sars-Covid-2 Vaccination 02/24/2019, 03/24/2019, 11/17/2019, 12/03/2019   Pneumococcal Conjugate-13 06/22/2014   Pneumococcal Polysaccharide-23 01/13/2009   Tdap 09/08/2013    Conditions to be addressed/monitored: CHF, HTN, HLD, and Pulmonary Disease  There are no care plans that you recently modified to display for this patient.   Medication Assistance: None required.  Patient affirms current coverage meets needs.  Patient's preferred pharmacy is:  [facility] [address], [place] [address] Phone: [phone] Fax: [facility] [address] [place] [address] Phone: [phone] Fax: [phone]  Uses pill box? No - basket of bottles on top of fridge & note pad on refrigerator of a list Pt endorses 100% compliance  Follow Up:  Patient agrees to Care Plan and Follow-up.  Plan: Telephone follow up appointment with care management team member scheduled for:  1 months  Larinda Buttery, PharmD Clinical Pharmacist [facility] [phone]

## 2021-08-01 NOTE — Patient Instructions (Signed)
Visit Information  Thank you for taking time to visit with me today. Please don't hesitate to contact me if I can be of assistance to you before our next scheduled telephone appointment.  Following are the goals we discussed today:   Patient Goals/Self-Care Activities Over the next 180 days, patient will:  take medications as prescribed and work with provider on medication access solutions  Follow Up Plan: Telephone follow up appointment with care management team member scheduled for:  1 month  Please call the care guide team at [phone] if you need to cancel or reschedule your appointment.    The patient verbalized understanding of instructions, educational materials, and care plan provided today and agreed to receive a mailed copy of patient instructions, educational materials, and care plan.   Gabriel Carina

## 2021-08-08 ENCOUNTER — Telehealth: Payer: Self-pay | Admitting: *Deleted

## 2021-08-08 DIAGNOSIS — I5042 Chronic combined systolic (congestive) and diastolic (congestive) heart failure: Secondary | ICD-10-CM

## 2021-08-08 NOTE — Telephone Encounter (Addendum)
Spoke with pt, received paperwork from the pharmacy asking for an alternative for entresto as the patient can not afford. She is currently working to see of she would be able to get patient assistance. She reports she has been out of medication for about 1 month now. Aware will forward to dr Jens Som to see if there is something she can take while waiting top get approved. Aware he is not in the office but will be back in touch with her. Pt agreed with this plan. She is also not taking farxiga.

## 2021-08-09 MED ORDER — LOSARTAN POTASSIUM 50 MG PO TABS: 50.0000 mg | ORAL_TABLET | Freq: Every day | ORAL | 3 refills | Status: DC

## 2021-08-09 NOTE — Telephone Encounter (Signed)
Spoke with pt, Aware of dr Ludwig Clarks recommendations.  She already has a previous prescription for the 25 mg tablets, Patient voiced understanding to take 2 tablets once daily. She will call when a refill is needed. Lab orders mailed to the pt

## 2021-08-12 DIAGNOSIS — E785 Hyperlipidemia, unspecified: Secondary | ICD-10-CM | POA: Diagnosis not present

## 2021-08-12 DIAGNOSIS — I10 Essential (primary) hypertension: Secondary | ICD-10-CM

## 2021-08-12 DIAGNOSIS — I5032 Chronic diastolic (congestive) heart failure: Secondary | ICD-10-CM | POA: Diagnosis not present

## 2021-08-12 DIAGNOSIS — J4530 Mild persistent asthma, uncomplicated: Secondary | ICD-10-CM | POA: Diagnosis not present

## 2021-08-29 ENCOUNTER — Ambulatory Visit (INDEPENDENT_AMBULATORY_CARE_PROVIDER_SITE_OTHER): Payer: Medicare Other | Admitting: Pharmacist

## 2021-08-29 DIAGNOSIS — I10 Essential (primary) hypertension: Secondary | ICD-10-CM

## 2021-08-29 DIAGNOSIS — E785 Hyperlipidemia, unspecified: Secondary | ICD-10-CM

## 2021-08-29 DIAGNOSIS — J4530 Mild persistent asthma, uncomplicated: Secondary | ICD-10-CM

## 2021-08-29 NOTE — Progress Notes (Signed)
Chronic Care Management Pharmacy Note  08/29/2021 Name:  Briana Silva MRN:  [id number] DOB:  1945/03/06  Summary: addressed HTN, HLD, asthma.  Cardiologist changed farxiga and entresto to losartan due to patient's difficulty with medication costs. Previously provided patient with patient assistance paperwork, but with change to losartan, medication assistance is now no longer relevant.  BP check at home per patient report: 130s/80s  Recommendations/Changes made from today's visit:   -Recommended repeat lipid panel at next PCP follow up and initiate rosuvastatin 49m daily (titrated up as tolerated) if LDL remains >70.  Plan: f/u with pharmacist in 6-8 months  Subjective: Briana DOMBEKis an 76y.o. year old female who is a primary patient of Metheney, CRene Kocher MD.  The CCM team was consulted for assistance with disease management and care coordination needs.    Engaged with patient by telephone for follow up visit in response to provider referral for pharmacy case management and/or care coordination services.   Consent to Services:  The patient was given information about Chronic Care Management services, agreed to services, and gave verbal consent prior to initiation of services.  Please see initial visit note for detailed documentation.   Patient Care Team: MHali Marry MD as PCP - General (Family Medicine) KDarius Bump [facility] (Pharmacist)  Recent office visits:  03/22/20- CBeatrice Lecher MD- seen for hospitalization follow up, referral to cardiology, no medication changes, follow up 6 months    Recent consult visits:  No visits noted    Hospital visits:  Medication Reconciliation was completed by comparing discharge summary, patient's EMR and Pharmacy list, and upon discussion with patient.   Admitted to the hospital on 03/14/20 due to Acute on chronic combined systolic and diastolic CHF. Discharge date was 03/17/20. [facility]     [facility] Lis[facility]op[facility] 2.5 mg- one tab daily   All other medications remain the same after Hospital Discharge  Objective:  Lab Results  Component Value Date   CREATININE 1.01 (H) 02/28/2021   CREATININE 0.89 01/31/2021   CREATININE 0.94 01/11/2021    Lab Results  Component Value Date   HGBA1C 5.5 06/20/2019       Component Value Date/Time   CHOL 233 (H) 06/20/2019 0948   TRIG 48 06/20/2019 0948   HDL 81 06/20/2019 0948   CHOLHDL 2.9 06/20/2019 0948   VLDL 10 06/25/2015 1018   LDLCALC 137 (H) 06/20/2019 0948       Latest Ref Rng & Units 03/22/2020   10:47 AM 06/20/2019    9:48 AM 06/25/2015   10:18 AM  Hepatic Function  Total Protein 6.1 - 8.1 g/dL 7.3  7.4  7.2   Albumin 3.6 - 5.1 g/dL   3.8   AST 10 - 35 U/L 20  25  17    ALT 6 - 29 U/L 24  16  11    Alk Phosphatase 33 - 130 U/L   76   Total Bilirubin 0.2 - 1.2 mg/dL 0.8  0.7  0.6     Lab Results  Component Value Date/Time   TSH 1.518 01/17/2015 03:16 PM       Latest Ref Rng & Units 06/20/2019    9:48 AM  CBC  WBC 3.8 - 10.8 Thousand/uL 5.4   Hemoglobin 11.7 - 15.5 g/dL 13.8   Hematocrit 35.0 - 45.0 % 42.5   Platelets 140 - 400 Thousand/uL 273     No  results found for: "VD25OH"  Clinical ASCVD: Yes  The 10-year ASCVD risk score (Arnett DK, et al., 2019) is: 24.1%   Values used to calculate the score:     Age: 76 years     Sex: Female     Is Non-Hispanic African American: Yes     Diabetic: No     Tobacco smoker: No     Systolic Blood Pressure: 852 mmHg     Is BP treated: Yes     HDL Cholesterol: 81 mg/dL     Total Cholesterol: 233 mg/dL    Social History   Tobacco Use  Smoking Status Never  Smokeless Tobacco Never   BP Readings from Last 3 Encounters:  03/25/21 127/87  02/28/21 111/75  02/01/21 106/72   Pulse Readings from Last 3 Encounters:  03/25/21 96  02/28/21 83  02/01/21 65   Wt Readings from  Last 3 Encounters:  03/25/21 234 lb (106.1 kg)  02/28/21 232 lb (105.2 kg)  01/31/21 236 lb (107 kg)    Assessment: Review of patient past medical history, allergies, medications, health status, including review of consultants reports, laboratory and other test data, was performed as part of comprehensive evaluation and provision of chronic care management services.   SDOH:  (Social Determinants of Health) assessments and interventions performed:    CCM Care Plan  Allergies  Allergen Reactions   Metoprolol Swelling    Other reaction(s): Other (See Comments) Severe fatigue, "tightness around my neck", severe leg weakness    Dust Mite Mixed Allergen Ext [Mite (D. Farinae)]     Medications Reviewed Today     Reviewed by Darius Bump, [facility] (Pharmacist) on 08/01/21 at 1049  Med List Status: <None>   Medication Order Taking? Sig Documenting Provider Last Dose Status Informant  albuterol (PROAIR HFA) 108 (90 Base) MCG/ACT inhaler [id number] Yes Inhale 2 puffs into the lungs every 4 (four) hours as needed for wheezing or shortness of breath. Hali Marry, MD Taking Active   albuterol (PROVENTIL) (2.5 MG/3ML) 0.083% nebulizer solution [id number] Yes Take 3 mLs (2.5 mg total) by nebulization every 4 (four) hours as needed for wheezing or shortness of breath. Dx:J45.41 Hali Marry, MD Taking Active   aspirin EC 81 MG tablet [id number] Yes Take 81 mg by mouth daily. Swallow whole. [provider] Taking Active   carvedilol (COREG) 12.5 MG tablet [id number] Yes Take 1 tablet (12.5 mg total) by mouth 2 (two) times daily with a meal. Stanford Breed, Denice Bors, MD Taking Active   cetirizine (ZYRTEC) 10 MG tablet [id number] Yes Take 1 tablet (10 mg total) by mouth daily as needed for allergies or rhinitis. Hali Marry, MD Taking Active   Cholecalciferol (QC VITAMIN D3) 50 MCG (2000 UT) TABS [id number] Yes Take 1 tablet by mouth daily. [provider] Taking Active    dapagliflozin propanediol (FARXIGA) 10 MG TABS tablet [id number] No Take 1 tablet (10 mg total) by mouth daily.  Patient not taking: Reported on 08/01/2021   Lelon Perla, MD Not Taking Active   ferrous sulfate 324 MG TBEC [id number] Yes Take 324 mg by mouth. [provider] Taking Active   fluticasone-salmeterol (ADVAIR) 250-50 MCG/ACT AEPB [id number] Yes INHALE 1 DOSE BY MOUTH INTO THE LUNGS TWICE DAILY Hali Marry, MD Taking Active   furosemide (LASIX) 20 MG tablet [id number] Yes Take 1 tablet (20 mg total) by mouth daily. Lelon Perla, MD Taking Active   Multiple Vitamins-Minerals (MULTIVITAMIN WITH MINERALS) tablet [id number]  Yes Take 1 tablet by mouth daily. [provider] Taking Active   Omega-3 Fatty Acids (FISH OIL) 1000 MG CAPS [id number] Yes Take 1 capsule by mouth daily. [provider] Taking Active   sacubitril-valsartan (ENTRESTO) 24-26 MG [id number] No Take 1 tablet by mouth 2 (two) times daily.  Patient not taking: Reported on 08/01/2021   Lelon Perla, MD Not Taking Active   spironolactone (ALDACTONE) 25 MG tablet [id number]  Take 0.5 tablets (12.5 mg total) by mouth daily. Lelon Perla, MD  Expired 06/23/21 2359             Patient Active Problem List   Diagnosis Date Noted   Fatty liver 02/28/2021   Pulmonary nodules 01/03/2021   Acute on chronic diastolic (congestive) heart failure ([place]) 03/23/2017   Mild persistent asthma without complication 14/38/8875   Peripheral edema 09/18/2016   Tachycardia with heart rate 121-140 beats per minute 09/18/2016   Abnormal echocardiogram 09/18/2016   IFG (impaired fasting glucose) 06/27/2015   Hearing loss in left ear 02/08/2013   Osteopenia 06/23/2011   Dietary counseling and surveillance 10/17/2010   OSA (obstructive sleep apnea) 08/08/2010   Hyperlipidemia 07/10/2009   DEPRESSION, MILD 07/10/2009   Severe obesity (BMI 35.0-39.9) with comorbidity ([place]) 12/15/2008   OTHER  SPECIFIED FORMS OF HEARING LOSS 12/15/2008   Congestive heart failure ([place]) 12/15/2008   ALLERGIC RHINITIS CAUSE UNSPECIFIED 12/15/2008   POSTMENOPAUSAL STATUS 12/15/2008   Essential hypertension 10/03/2008   Asthma, extrinsic 10/03/2008    Immunization History  Administered Date(s) Administered   Fluad Quad(high Dose 65+) 12/03/2019, 12/20/2020   Influenza Split 10/18/2010, 09/26/2011   Influenza, High Dose Seasonal PF 09/25/2016, 12/01/2017   Influenza,inj,Quad PF,6+ Mos 01/10/2013, 09/08/2013, 09/22/2014, 09/27/2015   Moderna Sars-Covid-2 Vaccination 02/24/2019, 03/24/2019, 11/17/2019, 12/03/2019   Pneumococcal Conjugate-13 06/22/2014   Pneumococcal Polysaccharide-23 01/13/2009   Tdap 09/08/2013    Conditions to be addressed/monitored: CHF, HTN, HLD, and Pulmonary Disease  There are no care plans that you recently modified to display for this patient.   Medication Assistance: None required.  Patient affirms current coverage meets needs.  Patient's preferred pharmacy is:  [facility] [address], [place] [address] Phone: [phone] Fax: [facility] [address] [place] [address] Phone: [phone] Fax: [phone]  Uses pill box? No - basket of bottles on top of fridge & note pad on refrigerator of a list Pt endorses 100% compliance  Follow Up:  Patient agrees to Care Plan and Follow-up.  Plan: Telephone follow up appointment with care management team member scheduled for:  6-8 months  Larinda Buttery, PharmD Clinical Pharmacist [facility] [phone]

## 2021-08-30 NOTE — Patient Instructions (Signed)
Visit Information  Thank you for taking time to visit with me today. Please don't hesitate to contact me if I can be of assistance to you before our next scheduled telephone appointment.  Following are the goals we discussed today:   Patient Goals/Self-Care Activities Over the next 180 days, patient will:  take medications as prescribed and work with provider on medication access solutions  Follow Up Plan: Telephone follow up appointment with care management team member scheduled for:  6-8 months  Please call the care guide team at [phone] if you need to cancel or reschedule your appointment.    Patient verbalizes understanding of instructions and care plan provided today and agrees to view in MyChart. Active MyChart status and patient understanding of how to access instructions and care plan via MyChart confirmed with patient.     Gabriel Carina

## 2021-09-12 DIAGNOSIS — J4530 Mild persistent asthma, uncomplicated: Secondary | ICD-10-CM

## 2021-09-12 DIAGNOSIS — E785 Hyperlipidemia, unspecified: Secondary | ICD-10-CM

## 2021-09-12 DIAGNOSIS — I10 Essential (primary) hypertension: Secondary | ICD-10-CM

## 2021-09-25 NOTE — Progress Notes (Signed)
HPI: FU CHF. Patient previously had congestive heart failure in [place]. She was told her heart was functioning at "30%". She was treated with medications with improvement. Apparently stress test normal but no records available. Echocardiogram January 2017 showed normal LV systolic function, mild left ventricular hypertrophy and biatrial enlargement. Previously seen at [facility] and reduced LV function felt possibly related to atrial tachycardia. Echo 11/22 showed EF 35-40, mild RAE, moderate MR, mild to moderate TR. CTA 12/22 showed no pulmonary embolus; nodule at major fissure and FU recommended 6 months. Nuclear study 12/22 showed EF 38 and no ischemia.  Monitor December 2022 showed sinus rhythm with occasional PAC, short burst of PAT, occasional PVC and rare couplet.  Coronary CTA January 2023 showed calcium score 0; study limited in distal right coronary artery, PDA and mid distal circumflex/ramus were not visualized.  Interpreted as could not rule out calcified plaque in the mid LAD.  Note there was pulmonary artery enlargement suggestive of pulmonary hypertension and findings suspicious for cirrhosis.  Follow-up echocardiogram ordered at last office visit but not performed.  Since last seen, patient describes increased dyspnea on exertion for 2 weeks.  Mild orthopnea and bilateral lower extremity edema.  No chest pain, palpitations or syncope.  Current Outpatient Medications  Medication Sig Dispense Refill   albuterol (PROAIR HFA) 108 (90 Base) MCG/ACT inhaler Inhale 2 puffs into the lungs every 4 (four) hours as needed for wheezing or shortness of breath. 6.7 g 5   albuterol (PROVENTIL) (2.5 MG/3ML) 0.083% nebulizer solution Take 3 mLs (2.5 mg total) by nebulization every 4 (four) hours as needed for wheezing or shortness of breath. Dx:J45.41 75 mL 6   aspirin EC 81 MG tablet Take 81 mg by mouth daily. Swallow whole.     carvedilol (COREG) 12.5 MG tablet Take 1 tablet (12.5 mg total)  by mouth 2 (two) times daily with a meal. 180 tablet 3   cetirizine (ZYRTEC) 10 MG tablet Take 1 tablet (10 mg total) by mouth daily as needed for allergies or rhinitis. 30 tablet 5   Cholecalciferol (QC VITAMIN D3) 50 MCG (2000 UT) TABS Take 1 tablet by mouth daily.     ferrous sulfate 324 MG TBEC Take 324 mg by mouth.     fluticasone-salmeterol (ADVAIR) 250-50 MCG/ACT AEPB INHALE 1 DOSE BY MOUTH INTO THE LUNGS TWICE DAILY 180 each 3   furosemide (LASIX) 20 MG tablet Take 1 tablet (20 mg total) by mouth daily. 90 tablet 3   losartan (COZAAR) 50 MG tablet Take 1 tablet (50 mg total) by mouth daily. 90 tablet 3   Multiple Vitamins-Minerals (MULTIVITAMIN WITH MINERALS) tablet Take 1 tablet by mouth daily.     Omega-3 Fatty Acids (FISH OIL) 1000 MG CAPS Take 1 capsule by mouth daily.     No current facility-administered medications for this visit.     Past Medical History:  Diagnosis Date   Abnormal echocardiogram 09/18/2016   Mild LVH, EF 55-65%, moderately dilated LA and RA   Allergy    Asthma    Asthma    Atrial tachycardia (HCC)    Cardiomyopathy (HCC)    CHF (congestive heart failure) (HCC)    Depression    Hyperlipidemia    Hypertension    Obesity     Past Surgical History:  Procedure Laterality Date   LEFT OOPHORECTOMY  1967   TUBAL LIGATION  1967    Social History   Socioeconomic History   Marital status:  Widowed    Spouse name: Not on file   Number of children: 2   Years of education: 14   Highest education level: Some college, no degree  Occupational History   Occupation: Retired.   Tobacco Use   Smoking status: Never   Smokeless tobacco: Never  Vaping Use   Vaping Use: Never used  Substance and Sexual Activity   Alcohol use: No   Drug use: No   Sexual activity: Not on file  Other Topics Concern   Not on file  Social History Narrative   Lives alone. She had two children, one has deceased. She enjoys writing and doing puzzles. She did get a new puppy  recently.   Social Determinants of Health   Financial Resource Strain: Low Risk  (09/30/2021)   Overall Financial Resource Strain (CARDIA)    Difficulty of Paying Living Expenses: Not hard at all  Food Insecurity: No Food Insecurity (09/30/2021)   Hunger Vital Sign    Worried About Running Out of Food in the Last Year: Never true    Ran Out of Food in the Last Year: Never true  Transportation Needs: No Transportation Needs (09/30/2021)   PRAPARE - Administrator, Civil Service (Medical): No    Lack of Transportation (Non-Medical): No  Physical Activity: Inactive (09/30/2021)   Exercise Vital Sign    Days of Exercise per Week: 0 days    Minutes of Exercise per Session: 0 min  Stress: No Stress Concern Present (09/30/2021)   [employer] of Occupational Health - Occupational Stress Questionnaire    Feeling of Stress : Not at all  Social Connections: Socially Isolated (09/30/2021)   Social Connection and Isolation Panel [NHANES]    Frequency of Communication with Friends and Family: More than three times a week    Frequency of Social Gatherings with Friends and Family: Once a week    Attends Religious Services: Never    Database administrator or Organizations: No    Attends Banker Meetings: Never    Marital Status: Widowed  Intimate Partner Violence: Not At Risk (09/30/2021)   Humiliation, Afraid, Rape, and Kick questionnaire    Fear of Current or Ex-Partner: No    Emotionally Abused: No    Physically Abused: No    Sexually Abused: No    Family History  Problem Relation Age of Onset   Hypertension Mother    Alzheimer's disease Mother    Cancer Father    Pancreatic cancer Father    Hypertension Father    Other Sister        brain tumor   Diabetes Brother    Non-Hodgkin's lymphoma Brother    Leukemia Brother    Cancer Daughter 35       Brain tumor    ROS: no fevers or chills, productive cough, hemoptysis, dysphasia, odynophagia, melena,  hematochezia, dysuria, hematuria, rash, seizure activity, orthopnea, PND, pedal edema, claudication. Remaining systems are negative.  Physical Exam: Well-developed well-nourished in no acute distress.  Skin is warm and dry.  HEENT is normal.  Neck is supple.  Chest is clear to auscultation with normal expansion.  Cardiovascular exam is irregular and tachycardic Abdominal exam nontender or distended. No masses palpated. Extremities show no edema. neuro grossly intact  ECG-atrial fibrillation at a rate of 119.  Personally reviewed  A/P  1 newly diagnosed atrial fibrillation-patient is in atrial fibrillation with mildly elevated rate today.  We will discontinue aspirin and treat with apixaban  5 mg twice daily.  Check TSH and echocardiogram.  This is likely causing her dyspnea/CHF symptoms.  We will plan to proceed with cardioversion in approximately 4 weeks (once she has taken apixaban for 3 consecutive weeks uninterrupted).  If she does not hold sinus rhythm would likely need antiarrhythmic versus ablation.  I will also add digoxin 0.125 mg daily for improved rate control.  2 nonischemic cardiomyopathy-as outlined above previous cardiac CTA suboptimal but calcium score was 0 and follow-up nuclear study showed no ischemia.  Monitor results outlined above and not clear that atrial tachycardia was causing cardiomyopathy.  Continue losartan and carvedilol.  We will plan to repeat echocardiogram to reassess LV function.  If LV function significantly reduced we will transition losartan to Entresto.  2 history of atrial tachycardia-continue beta-blocker.  3 chronic systolic congestive heart failure-continue Lasix.  Add spironolactone 25 mg daily.  Check potassium and renal function in 1 week.  We will consider addition of Farxiga at next office visit.  4 hypertension-blood pressure elevated.  We are adding spironolactone.  Follow blood pressure and adjust as needed.  5 hyperlipidemia-continue  statin.  6 history of pulmonary nodule-will plan repeat noncontrast chest CT to reassess.  [name], MD

## 2021-09-30 ENCOUNTER — Ambulatory Visit (INDEPENDENT_AMBULATORY_CARE_PROVIDER_SITE_OTHER): Payer: Medicare Other | Admitting: Family Medicine

## 2021-09-30 DIAGNOSIS — Z0000 Encounter for general adult medical examination without abnormal findings: Secondary | ICD-10-CM | POA: Diagnosis not present

## 2021-09-30 NOTE — Patient Instructions (Addendum)
MEDICARE ANNUAL WELLNESS VISIT Health Maintenance Summary and Written Plan of Care  Briana Silva ,  Thank you for allowing me to perform your Medicare Annual Wellness Visit and for your ongoing commitment to your health.   Health Maintenance & Immunization History Health Maintenance  Topic Date Due  . COVID-19 Vaccine (5 - Moderna risk series) 10/16/2021 (Originally 01/28/2020)  . Zoster Vaccines- Shingrix (1 of 2) 12/30/2021 (Originally 07/04/1964)  . INFLUENZA VACCINE  04/13/2022 (Originally 08/13/2021)  . TETANUS/TDAP  09/09/2023  . Pneumonia Vaccine 57+ Years old  Completed  . DEXA SCAN  Completed  . Hepatitis C Screening  Completed  . HPV VACCINES  Aged Out  . COLONOSCOPY (Pts 45-65yrs Insurance coverage will need to be confirmed)  Discontinued   Immunization History  Administered Date(s) Administered  . Fluad Quad(high Dose 65+) 12/03/2019, 12/20/2020  . Influenza Split 10/18/2010, 09/26/2011  . Influenza, High Dose Seasonal PF 09/25/2016, 12/01/2017  . Influenza,inj,Quad PF,6+ Mos 01/10/2013, 09/08/2013, 09/22/2014, 09/27/2015  . Moderna Sars-Covid-2 Vaccination 02/24/2019, 03/24/2019, 11/17/2019, 12/03/2019  . Pneumococcal Conjugate-13 06/22/2014  . Pneumococcal Polysaccharide-23 01/13/2009  . Tdap 09/08/2013    These are the patient goals that we discussed:  Goals Addressed              This Visit's Progress   .  Patient Stated (pt-stated)        09/30/2021 AWV Goal: Exercise for General Health  Patient will verbalize understanding of the benefits of increased physical activity: Exercising regularly is important. It will improve your overall fitness, flexibility, and endurance. Regular exercise also will improve your overall health. It can help you control your weight, reduce stress, and improve your bone density. Over the next year, patient will increase physical activity as tolerated with a goal of at least 150 minutes of moderate physical activity per week.   You can tell that you are exercising at a moderate intensity if your heart starts beating faster and you start breathing faster but can still hold a conversation. Moderate-intensity exercise ideas include: Walking 1 mile (1.6 km) in about 15 minutes Biking Hiking Golfing Dancing Water aerobics Patient will verbalize understanding of everyday activities that increase physical activity by providing examples like the following: Yard work, such as: Insurance underwriter Gardening Washing windows or floors Patient will be able to explain general safety guidelines for exercising:  Before you start a new exercise program, talk with your health care provider. Do not exercise so much that you hurt yourself, feel dizzy, or get very short of breath. Wear comfortable clothes and wear shoes with good support. Drink plenty of water while you exercise to prevent dehydration or heat stroke. Work out until your breathing and your heartbeat get faster.         This is a list of Health Maintenance Items that are overdue or due now: Influenza vaccine Shingrix vaccine  Orders/Referrals Placed Today: No orders of the defined types were placed in this encounter.  (Contact our referral department at [phone] if you have not spoken with someone about your referral appointment within the next 5 days)    Follow-up Plan Follow-up with Agapito Games, MD as planned Schedule the shingles vaccine at your pharmacy.  Medicare wellness visit in one year. AVS printed and mailed to patient.      Health Maintenance, Female Adopting a healthy lifestyle and getting preventive care are important in promoting health and wellness.  Ask your health care provider about: The right schedule for you to have regular tests and exams. Things you can do on your own to prevent diseases and keep yourself healthy. What should I know  about diet, weight, and exercise? Eat a healthy diet  Eat a diet that includes plenty of vegetables, fruits, low-fat dairy products, and lean protein. Do not eat a lot of foods that are high in solid fats, added sugars, or sodium. Maintain a healthy weight Body mass index (BMI) is used to identify weight problems. It estimates body fat based on height and weight. Your health care provider can help determine your BMI and help you achieve or maintain a healthy weight. Get regular exercise Get regular exercise. This is one of the most important things you can do for your health. Most adults should: Exercise for at least 150 minutes each week. The exercise should increase your heart rate and make you sweat (moderate-intensity exercise). Do strengthening exercises at least twice a week. This is in addition to the moderate-intensity exercise. Spend less time sitting. Even light physical activity can be beneficial. Watch cholesterol and blood lipids Have your blood tested for lipids and cholesterol at 78 years of age, then have this test every 5 years. Have your cholesterol levels checked more often if: Your lipid or cholesterol levels are high. You are older than 78 years of age. You are at high risk for heart disease. What should I know about cancer screening? Depending on your health history and family history, you may need to have cancer screening at various ages. This may include screening for: Breast cancer. Cervical cancer. Colorectal cancer. Skin cancer. Lung cancer. What should I know about heart disease, diabetes, and high blood pressure? Blood pressure and heart disease High blood pressure causes heart disease and increases the risk of stroke. This is more likely to develop in people who have high blood pressure readings or are overweight. Have your blood pressure checked: Every 3-5 years if you are 72-34 years of age. Every year if you are 21 years old or older. Diabetes Have  regular diabetes screenings. This checks your fasting blood sugar level. Have the screening done: Once every three years after age 56 if you are at a normal weight and have a low risk for diabetes. More often and at a younger age if you are overweight or have a high risk for diabetes. What should I know about preventing infection? Hepatitis B If you have a higher risk for hepatitis B, you should be screened for this virus. Talk with your health care provider to find out if you are at risk for hepatitis B infection. Hepatitis C Testing is recommended for: Everyone born from [birth year] through 1967. Anyone with known risk factors for hepatitis C. Sexually transmitted infections (STIs) Get screened for STIs, including gonorrhea and chlamydia, if: You are sexually active and are younger than 78 years of age. You are older than 78 years of age and your health care provider tells you that you are at risk for this type of infection. Your sexual activity has changed since you were last screened, and you are at increased risk for chlamydia or gonorrhea. Ask your health care provider if you are at risk. Ask your health care provider about whether you are at high risk for HIV. Your health care provider may recommend a prescription medicine to help prevent HIV infection. If you choose to take medicine to prevent HIV, you should first get tested for HIV.  You should then be tested every 3 months for as long as you are taking the medicine. Pregnancy If you are about to stop having your period (premenopausal) and you may become pregnant, seek counseling before you get pregnant. Take 400 to 800 micrograms (mcg) of folic acid every day if you become pregnant. Ask for birth control (contraception) if you want to prevent pregnancy. Osteoporosis and menopause Osteoporosis is a disease in which the bones lose minerals and strength with aging. This can result in bone fractures. If you are 18 years old or older, or if you  are at risk for osteoporosis and fractures, ask your health care provider if you should: Be screened for bone loss. Take a calcium or vitamin D supplement to lower your risk of fractures. Be given hormone replacement therapy (HRT) to treat symptoms of menopause. Follow these instructions at home: Alcohol use Do not drink alcohol if: Your health care provider tells you not to drink. You are pregnant, may be pregnant, or are planning to become pregnant. If you drink alcohol: Limit how much you have to: 0-1 drink a day. Know how much alcohol is in your drink. In the [place] one drink equals one 12 oz bottle of beer (355 mL), one 5 oz glass of wine (148 mL), or one 1 oz glass of hard liquor (44 mL). Lifestyle Do not use any products that contain nicotine or tobacco. These products include cigarettes, chewing tobacco, and vaping devices, such as e-cigarettes. If you need help quitting, ask your health care provider. Do not use street drugs. Do not share needles. Ask your health care provider for help if you need support or information about quitting drugs. General instructions Schedule regular health, dental, and eye exams. Stay current with your vaccines. Tell your health care provider if: You often feel depressed. You have ever been abused or do not feel safe at home. Summary Adopting a healthy lifestyle and getting preventive care are important in promoting health and wellness. Follow your health care provider's instructions about healthy diet, exercising, and getting tested or screened for diseases. Follow your health care provider's instructions on monitoring your cholesterol and blood pressure. This information is not intended to replace advice given to you by your health care provider. Make sure you discuss any questions you have with your health care provider. Document Revised: 05/21/2020 Document Reviewed: 05/21/2020 Elsevier Patient Education  2023 [employer].

## 2021-09-30 NOTE — Progress Notes (Signed)
MEDICARE ANNUAL WELLNESS VISIT  09/30/2021  Telephone Visit Disclaimer This Medicare AWV was conducted by telephone due to national recommendations for restrictions regarding the COVID-19 Pandemic (e.g. social distancing).  I verified, using two identifiers, that I am speaking with Briana Silva or their authorized healthcare agent. I discussed the limitations, risks, security, and privacy concerns of performing an evaluation and management service by telephone and the potential availability of an in-person appointment in the future. The patient expressed understanding and agreed to proceed.  Location of Patient: Home Location of Provider (nurse):  In the office.  Subjective:    Briana Silva is a 76 y.o. female patient of Metheney, Barbarann Ehlers, MD who had a Medicare Annual Wellness Visit today via telephone. Briana Silva is Retired and lives alone. she has 2 children. she reports that she is socially active and does interact with friends/family regularly. she is minimally physically active and enjoys  writing and doing puzzles.  Patient Care Team: Agapito Games, MD as PCP - General (Family Medicine) Gabriel Carina, [facility] as Pharmacist (Pharmacist)     09/30/2021   10:26 AM 09/28/2020    9:43 AM 06/20/2019    8:49 AM  Advanced Directives  Does Patient Have a Medical Advance Directive? No No No  Would patient like information on creating a medical advance directive? No - Patient declined No - Patient declined No - Patient declined    Hospital Utilization Over the Past 12 Months: # of hospitalizations or ER visits: 1 # of surgeries: 0  Review of Systems    Patient reports that her overall health is unchanged compared to last year.  History obtained from chart review and the patient  Patient Reported Readings (BP, Pulse, CBG, Weight, etc) none  Pain Assessment Pain : No/denies pain     Current Medications & Allergies (verified) Allergies as of 09/30/2021        Reactions   Metoprolol Swelling   Other reaction(s): Other (See Comments) Severe fatigue, "tightness around my neck", severe leg weakness   Dust Mite Mixed Allergen Ext [mite (d. Farinae)]         Medication List        Accurate as of September 30, 2021 10:48 AM. If you have any questions, ask your nurse or doctor.          albuterol (2.5 MG/3ML) 0.083% nebulizer solution Commonly known as: PROVENTIL Take 3 mLs (2.5 mg total) by nebulization every 4 (four) hours as needed for wheezing or shortness of breath. Dx:J45.41   albuterol 108 (90 Base) MCG/ACT inhaler Commonly known as: ProAir HFA Inhale 2 puffs into the lungs every 4 (four) hours as needed for wheezing or shortness of breath.   aspirin EC 81 MG tablet Take 81 mg by mouth daily. Swallow whole.   carvedilol 12.5 MG tablet Commonly known as: COREG Take 1 tablet (12.5 mg total) by mouth 2 (two) times daily with a meal.   cetirizine 10 MG tablet Commonly known as: ZYRTEC Take 1 tablet (10 mg total) by mouth daily as needed for allergies or rhinitis.   ferrous sulfate 324 MG Tbec Take 324 mg by mouth.   Fish Oil 1000 MG Caps Take 1 capsule by mouth daily.   fluticasone-salmeterol 250-50 MCG/ACT Aepb Commonly known as: ADVAIR INHALE 1 DOSE BY MOUTH INTO THE LUNGS TWICE DAILY   furosemide 20 MG tablet Commonly known as: LASIX Take 1 tablet (20 mg total) by mouth daily.   losartan 50  MG tablet Commonly known as: COZAAR Take 1 tablet (50 mg total) by mouth daily.   losartan 25 MG tablet Commonly known as: COZAAR Take 25 mg by mouth daily.   multivitamin with minerals tablet Take 1 tablet by mouth daily.   QC Vitamin D3 50 MCG (2000 UT) Tabs Generic drug: Cholecalciferol Take 1 tablet by mouth daily.   spironolactone 25 MG tablet Commonly known as: ALDACTONE Take 0.5 tablets (12.5 mg total) by mouth daily.        History (reviewed): Past Medical History:  Diagnosis Date   Abnormal  echocardiogram 09/18/2016   Mild LVH, EF 55-65%, moderately dilated LA and RA   Allergy    Asthma    Asthma    Atrial tachycardia (HCC)    Cardiomyopathy (HCC)    CHF (congestive heart failure) ([place])    Depression    Hyperlipidemia    Hypertension    Obesity    Past Surgical History:  Procedure Laterality Date   LEFT OOPHORECTOMY  1967   TUBAL LIGATION  1967   Family History  Problem Relation Age of Onset   Hypertension Mother    Alzheimer's disease Mother    Cancer Father    Pancreatic cancer Father    Hypertension Father    Other Sister        brain tumor   Diabetes Brother    Non-Hodgkin's lymphoma Brother    Leukemia Brother    Cancer Daughter 38       Brain tumor   Social History   Socioeconomic History   Marital status: Widowed    Spouse name: Not on file   Number of children: 2   Years of education: 14   Highest education level: Some college, no degree  Occupational History   Occupation: Retired.   Tobacco Use   Smoking status: Never   Smokeless tobacco: Never  Vaping Use   Vaping Use: Never used  Substance and Sexual Activity   Alcohol use: No   Drug use: No   Sexual activity: Not on file  Other Topics Concern   Not on file  Social History Narrative   Lives alone. She had two children, one has deceased. She enjoys writing and doing puzzles. She did get a new puppy recently.   Social Determinants of Health   Financial Resource Strain: Low Risk  (09/30/2021)   Overall Financial Resource Strain (CARDIA)    Difficulty of Paying Living Expenses: Not hard at all  Food Insecurity: No Food Insecurity (09/30/2021)   Hunger Vital Sign    Worried About Running Out of Food in the Last Year: Never true    Ran Out of Food in the Last Year: Never true  Transportation Needs: No Transportation Needs (09/30/2021)   PRAPARE - Hydrologist (Medical): No    Lack of Transportation (Non-Medical): No  Physical Activity: Inactive  (09/30/2021)   Exercise Vital Sign    Days of Exercise per Week: 0 days    Minutes of Exercise per Session: 0 min  Stress: No Stress Concern Present (09/30/2021)   [place]    Feeling of Stress : Not at all  Social Connections: Socially Isolated (09/30/2021)   Social Connection and Isolation Panel [NHANES]    Frequency of Communication with Friends and Family: More than three times a week    Frequency of Social Gatherings with Friends and Family: Once a week    Attends  Religious Services: Never    Active Member of Clubs or Organizations: No    Attends BankerClub or Organization Meetings: Never    Marital Status: Widowed    Activities of Daily Living    09/30/2021   10:37 AM  In your present state of health, do you have any difficulty performing the following activities:  Hearing? 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  Vision? 0  Difficulty concentrating or making decisions? 0  Walking or climbing stairs? 1  Dressing or bathing? 0  Doing errands, shopping? 0  Preparing Food and eating ? N  Using the Toilet? N  In the past six months, have you accidently leaked urine? Y  Comment sometimes; when she has urgency after taking the medication  Do you have problems with loss of bowel control? N  Managing your Medications? N  Managing your Finances? N  Housekeeping or managing your Housekeeping? N    Patient Education/ Literacy How often do you need to have someone help you when you read instructions, pamphlets, or other written materials from your doctor or pharmacy?: 1 - Never What is the last grade level you completed in school?: two years of college  Exercise Current Exercise Habits: The patient does not participate in regular exercise at present, Exercise limited by: None identified  Diet Patient reports consuming 3 small meals a day and 1 snack(s) a day Patient reports that her primary diet is: Regular Patient  reports that she does have regular access to food.   Depression Screen    09/30/2021   10:28 AM 02/28/2021    2:11 PM 09/28/2020    9:48 AM 03/27/2016    1:24 PM 06/25/2015    2:52 PM 01/10/2013    4:56 PM  PHQ 2/9 Scores  PHQ - 2 Score 1 0 0 1 0 0     Fall Risk    09/30/2021   10:28 AM 02/28/2021    2:11 PM 09/28/2020    9:46 AM 06/20/2019    8:48 AM 03/27/2016    1:24 PM  Fall Risk   Falls in the past year? 0 0 0 0 No  Number falls in past yr: 0 0 0    Injury with Fall? 0 0 0    Risk for fall due to : No Fall Risks No Fall Risks No Fall Risks No Fall Risks   Follow up Falls evaluation completed Falls evaluation completed;Falls prevention discussed Falls evaluation completed       Objective:  Briana PomfretOdette M Silva seemed alert and oriented and she participated appropriately during our telephone visit.  Blood Pressure Weight BMI  BP Readings from Last 3 Encounters:  03/25/21 127/87  02/28/21 111/75  02/01/21 106/72   Wt Readings from Last 3 Encounters:  03/25/21 234 lb (106.1 kg)  02/28/21 232 lb (105.2 kg)  01/31/21 236 lb (107 kg)   BMI Readings from Last 1 Encounters:  03/25/21 37.77 kg/m    *Unable to obtain current vital signs, weight, and BMI due to telephone visit type  Hearing/Vision  Briana Silva did not seem to have difficulty with hearing/understanding during the telephone conversation Reports that she has not had a formal eye exam by an eye care professional within the past year Reports that she has not had a formal hearing evaluation within the past year *Unable to fully assess hearing and vision during telephone visit type  Cognitive Function:    09/30/2021   10:40 AM 09/28/2020    9:56 AM  06/20/2019    8:47 AM  6CIT Screen  What Year? 0 points 0 points 0 points  What month? 0 points 0 points 0 points  What time? 0 points 0 points 0 points  Count back from 20 0 points 0 points 0 points  Months in reverse 0 points 0 points 0 points  Repeat phrase 0 points 0  points 4 points  Total Score 0 points 0 points 4 points   (Normal:0-7, Significant for Dysfunction: >8)  Normal Cognitive Function Screening: Yes   Immunization & Health Maintenance Record Immunization History  Administered Date(s) Administered   Fluad Quad(high Dose 65+) 12/03/2019, 12/20/2020   Influenza Split 10/18/2010, 09/26/2011   Influenza, High Dose Seasonal PF 09/25/2016, 12/01/2017   Influenza,inj,Quad PF,6+ Mos 01/10/2013, 09/08/2013, 09/22/2014, 09/27/2015   Moderna Sars-Covid-2 Vaccination 02/24/2019, 03/24/2019, 11/17/2019, 12/03/2019   Pneumococcal Conjugate-13 06/22/2014   Pneumococcal Polysaccharide-23 01/13/2009   Tdap 09/08/2013    Health Maintenance  Topic Date Due   COVID-19 Vaccine (5 - Moderna risk series) 10/16/2021 (Originally 01/28/2020)   Zoster Vaccines- Shingrix (1 of 2) 12/30/2021 (Originally 07/04/1964)   INFLUENZA VACCINE  04/13/2022 (Originally 08/13/2021)   TETANUS/TDAP  09/09/2023   Pneumonia Vaccine 81+ Years old  Completed   DEXA SCAN  Completed   Hepatitis C Screening  Completed   HPV VACCINES  Aged Out   COLONOSCOPY (Pts 45-67yrs Insurance coverage will need to be confirmed)  Discontinued       Assessment  This is a routine wellness examination for [facility].  Health Maintenance: Due or Overdue There are no preventive care reminders to display for this patient.   Briana Silva does not need a referral for Community Assistance: Care Management:   no Social Work:    no Prescription Assistance:  no Nutrition/Diabetes Education:  no   Plan:  Personalized Goals  Goals Addressed               This Visit's Progress     Patient Stated (pt-stated)        09/30/2021 AWV Goal: Exercise for General Health  Patient will verbalize understanding of the benefits of increased physical activity: Exercising regularly is important. It will improve your overall fitness, flexibility, and endurance. Regular exercise also will  improve your overall health. It can help you control your weight, reduce stress, and improve your bone density. Over the next year, patient will increase physical activity as tolerated with a goal of at least 150 minutes of moderate physical activity per week.  You can tell that you are exercising at a moderate intensity if your heart starts beating faster and you start breathing faster but can still hold a conversation. Moderate-intensity exercise ideas include: Walking 1 mile (1.6 km) in about 15 minutes Biking Hiking Golfing Dancing Water aerobics Patient will verbalize understanding of everyday activities that increase physical activity by providing examples like the following: Yard work, such as: Sales promotion account executive Gardening Washing windows or floors Patient will be able to explain general safety guidelines for exercising:  Before you start a new exercise program, talk with your health care provider. Do not exercise so much that you hurt yourself, feel dizzy, or get very short of breath. Wear comfortable clothes and wear shoes with good support. Drink plenty of water while you exercise to prevent dehydration or heat stroke. Work out until your breathing and your heartbeat get faster.  Personalized Health Maintenance & Screening Recommendations  Influenza vaccine Shingrix vaccine  Lung Cancer Screening Recommended: no (Low Dose CT Chest recommended if Age 58-80 years, 30 pack-year currently smoking OR have quit w/in past 15 years) Hepatitis C Screening recommended: no HIV Screening recommended: no  Advanced Directives: Written information was not prepared per patient's request.  Referrals & Orders No orders of the defined types were placed in this encounter.   Follow-up Plan Follow-up with Hali Marry, MD as planned Schedule the shingles vaccine at your pharmacy.  Medicare  wellness visit in one year. AVS printed and mailed to patient.   I have personally reviewed and noted the following in the patient's chart:   Medical and social history Use of alcohol, tobacco or illicit drugs  Current medications and supplements Functional ability and status Nutritional status Physical activity Advanced directives List of other physicians Hospitalizations, surgeries, and ER visits in previous 12 months Vitals Screenings to include cognitive, depression, and falls Referrals and appointments  In addition, I have reviewed and discussed with Briana Silva certain preventive protocols, quality metrics, and best practice recommendations. A written personalized care plan for preventive services as well as general preventive health recommendations is available and can be mailed to the patient at her request.      Tinnie Gens, RN BSN  09/30/2021

## 2021-10-02 ENCOUNTER — Encounter: Payer: Self-pay | Admitting: Cardiology

## 2021-10-02 ENCOUNTER — Ambulatory Visit (INDEPENDENT_AMBULATORY_CARE_PROVIDER_SITE_OTHER): Payer: Medicare Other | Admitting: Physician Assistant

## 2021-10-02 ENCOUNTER — Ambulatory Visit: Payer: Medicare Other | Admitting: Cardiology

## 2021-10-02 VITALS — BP 128/92 | HR 119 | Ht 66.0 in | Wt 247.1 lb

## 2021-10-02 VITALS — BP 121/77 | HR 110 | Resp 20 | Ht 66.0 in | Wt 247.0 lb

## 2021-10-02 DIAGNOSIS — I4819 Other persistent atrial fibrillation: Secondary | ICD-10-CM | POA: Diagnosis not present

## 2021-10-02 DIAGNOSIS — Z23 Encounter for immunization: Secondary | ICD-10-CM | POA: Diagnosis not present

## 2021-10-02 DIAGNOSIS — R911 Solitary pulmonary nodule: Secondary | ICD-10-CM | POA: Diagnosis not present

## 2021-10-02 MED ORDER — APIXABAN 5 MG PO TABS: 5.0000 mg | ORAL_TABLET | Freq: Two times a day (BID) | ORAL | 6 refills | Status: DC

## 2021-10-02 MED ORDER — DIGOXIN 125 MCG PO TABS: 0.1250 mg | ORAL_TABLET | Freq: Every day | ORAL | 3 refills | Status: DC

## 2021-10-02 MED ORDER — SPIRONOLACTONE 25 MG PO TABS: 25.0000 mg | ORAL_TABLET | Freq: Every day | ORAL | 3 refills | Status: DC

## 2021-10-02 NOTE — Patient Instructions (Signed)
Medication Instructions:   START DIGOXIN 0.125 MG ONCE DAILY  START SPIRONOLACTONE 25 MG ONCE DAILY  STOP ASPIRIN  START ELIQUIS 5 MG ONE TABLET TWICE DAILY  *If you need a refill on your cardiac medications before your next appointment, please call your pharmacy*   Lab Work:  Your physician recommends that you return for lab work in: ONE WEEK-DO NOT NEED TO FAST  If you have labs (blood work) drawn today and your tests are completely normal, you will receive your results only by: [place] (if you have MyChart) OR A paper copy in the mail If you have any lab test that is abnormal or we need to change your treatment, we will call you to review the results.   Testing/Procedures:  Your physician has requested that you have an echocardiogram. Echocardiography is a painless test that uses sound waves to create images of your heart. It provides your doctor with information about the size and shape of your heart and how well your heart's chambers and valves are working. This procedure takes approximately one hour. There are no restrictions for this procedure. 1 ST FLOOR IMAGING DEPARTMENT [place] [facility]   CT WITHOUT CONTRAST TO FOLLOW UP ON LUNG NODULE IN THE [facility] DEPARTMENT   You are scheduled for a  Cardioversion on [facility] 11/11/21 with Dr. Audie Box.  Please arrive at the [facility] (Main Entrance A) at [facility]: [address] [place], [place] [address] at 10 am. (1 hour prior to procedure unless lab work is needed; if lab work is needed arrive 1.5 hours ahead)  DIET: Nothing to eat or drink after midnight except a sip of water with medications (see medication instructions below)  FYI: For your safety, and to allow [place] to monitor your vital signs accurately during the surgery/procedure we request that   if you have artificial nails, gel coating, SNS etc. Please have those removed prior to your surgery/procedure. Not having the nail coverings /polish  removed may result in cancellation or delay of your surgery/procedure.   Medication Instructions: TAKE ALL MEDICATIONS WITH SIPS OF WATER  Continue your anticoagulant: ELIQUIS   You must have a responsible person to drive you home and stay in the waiting area during your procedure. Failure to do so could result in cancellation.  Bring your insurance cards.  *Special Note: Every effort is made to have your procedure done on time. Occasionally there are emergencies that occur at the hospital that may cause delays. Please be patient if a delay does occur.    Follow-Up: At [facility], you and your health needs are our priority.  As part of our continuing mission to provide you with exceptional heart care, we have created designated Provider Care Teams.  These Care Teams include your primary Cardiologist (physician) and Advanced Practice Providers (APPs -  Physician Assistants and Nurse Practitioners) who all work together to provide you with the care you need, when you need it.  We recommend signing up for the patient portal called "MyChart".  Sign up information is provided on this After Visit Summary.  MyChart is used to connect with patients for Virtual Visits (Telemedicine).  Patients are able to view lab/test results, encounter notes, upcoming appointments, etc.  Non-urgent messages can be sent to your provider as well.   To learn more about what you can do with MyChart, go to [email or web address].    Your next appointment:   3 month(s)  The format for your next appointment:  In Person  Provider:   [name], MD

## 2021-10-02 NOTE — Progress Notes (Signed)
Patient here for Influenza vaccination.  Site location Right Deltoid

## 2021-10-02 NOTE — Addendum Note (Signed)
Addended by: Beverlee Nims on: 10/02/2021 03:24 PM   Modules accepted: Level of Service

## 2021-10-08 ENCOUNTER — Ambulatory Visit (HOSPITAL_BASED_OUTPATIENT_CLINIC_OR_DEPARTMENT_OTHER)
Admission: RE | Admit: 2021-10-08 | Discharge: 2021-10-08 | Disposition: A | Discharge status: Home / Self Care | Payer: Medicare Other | Source: Ambulatory Visit | Attending: Cardiology | Admitting: Cardiology

## 2021-10-08 DIAGNOSIS — I4819 Other persistent atrial fibrillation: Secondary | ICD-10-CM | POA: Insufficient documentation

## 2021-10-08 LAB — ECHOCARDIOGRAM COMPLETE
AR max vel: 1.59 cm2
AV Area VTI: 1.51 cm2
AV Area mean vel: 1.45 cm2
AV Mean grad: 4 mmHg
AV Peak grad: 7.1 mmHg
Ao pk vel: 1.33 m/s
MV M vel: 4.81 m/s
MV Peak grad: 92.5 mmHg
Radius: 0.8 cm
S' Lateral: 3.7 cm

## 2021-10-08 NOTE — Progress Notes (Signed)
  Echocardiogram 2D Echocardiogram has been performed.  Merrie Roof F 10/08/2021, 11:50 AM

## 2021-10-10 ENCOUNTER — Encounter: Payer: Self-pay | Admitting: *Deleted

## 2021-10-14 DIAGNOSIS — I4819 Other persistent atrial fibrillation: Secondary | ICD-10-CM | POA: Diagnosis not present

## 2021-10-15 ENCOUNTER — Encounter: Payer: Self-pay | Admitting: *Deleted

## 2021-10-15 LAB — BASIC METABOLIC PANEL
BUN: 15 mg/dL (ref 7–25)
CO2: 27 mmol/L (ref 20–32)
Calcium: 9.8 mg/dL (ref 8.6–10.4)
Chloride: 104 mmol/L (ref 98–110)
Creat: 0.9 mg/dL (ref 0.60–1.00)
Glucose, Bld: 94 mg/dL (ref 65–99)
Potassium: 4.7 mmol/L (ref 3.5–5.3)
Sodium: 141 mmol/L (ref 135–146)

## 2021-10-15 LAB — CBC
HCT: 43.6 % (ref 35.0–45.0)
Hemoglobin: 14 g/dL (ref 11.7–15.5)
MCH: 30.2 pg (ref 27.0–33.0)
MCHC: 32.1 g/dL (ref 32.0–36.0)
MCV: 94.2 fL (ref 80.0–100.0)
MPV: 9.6 fL (ref 7.5–12.5)
Platelets: 224 10*3/uL (ref 140–400)
RBC: 4.63 10*6/uL (ref 3.80–5.10)
RDW: 12.2 % (ref 11.0–15.0)
WBC: 4.3 10*3/uL (ref 3.8–10.8)

## 2021-10-15 LAB — TSH: TSH: 1.23 mIU/L (ref 0.40–4.50)

## 2021-10-17 ENCOUNTER — Ambulatory Visit (INDEPENDENT_AMBULATORY_CARE_PROVIDER_SITE_OTHER): Payer: Medicare Other

## 2021-10-17 ENCOUNTER — Encounter: Payer: Medicare Other | Admitting: Family Medicine

## 2021-10-17 DIAGNOSIS — R911 Solitary pulmonary nodule: Secondary | ICD-10-CM | POA: Diagnosis not present

## 2021-10-17 DIAGNOSIS — I700 Atherosclerosis of aorta: Secondary | ICD-10-CM | POA: Diagnosis not present

## 2021-10-21 ENCOUNTER — Telehealth: Payer: Self-pay | Admitting: *Deleted

## 2021-10-21 NOTE — Telephone Encounter (Signed)
Spoke with pt, aware of CT results. She reports she is feeling fine and does not want to have a DCCV right now and she has cancelled that procedure. Procedure cancelled with hospital.

## 2021-10-21 NOTE — Telephone Encounter (Signed)
-----   Message from Lelon Perla, MD sent at 10/19/2021  9:13 AM EDT ----- No nodule noted; no need for FU testing [facility]s

## 2021-11-04 ENCOUNTER — Encounter: Payer: Medicare Other | Admitting: Family Medicine

## 2021-11-11 ENCOUNTER — Encounter (HOSPITAL_COMMUNITY): Payer: Self-pay

## 2021-11-11 ENCOUNTER — Ambulatory Visit (HOSPITAL_COMMUNITY): Admit: 2021-11-11 | Payer: Medicare Other | Admitting: Cardiovascular Disease

## 2021-11-11 SURGERY — CARDIOVERSION
Anesthesia: General

## 2021-12-03 ENCOUNTER — Encounter: Payer: Self-pay | Admitting: Family Medicine

## 2021-12-03 ENCOUNTER — Ambulatory Visit (INDEPENDENT_AMBULATORY_CARE_PROVIDER_SITE_OTHER): Payer: Medicare Other | Admitting: Family Medicine

## 2021-12-03 VITALS — BP 132/82 | HR 97 | Ht 66.0 in | Wt 245.1 lb

## 2021-12-03 DIAGNOSIS — Z0000 Encounter for general adult medical examination without abnormal findings: Secondary | ICD-10-CM | POA: Diagnosis not present

## 2021-12-03 DIAGNOSIS — J4531 Mild persistent asthma with (acute) exacerbation: Secondary | ICD-10-CM

## 2021-12-03 DIAGNOSIS — R932 Abnormal findings on diagnostic imaging of liver and biliary tract: Secondary | ICD-10-CM | POA: Diagnosis not present

## 2021-12-03 DIAGNOSIS — Z23 Encounter for immunization: Secondary | ICD-10-CM | POA: Diagnosis not present

## 2021-12-03 DIAGNOSIS — E785 Hyperlipidemia, unspecified: Secondary | ICD-10-CM

## 2021-12-03 DIAGNOSIS — L819 Disorder of pigmentation, unspecified: Secondary | ICD-10-CM

## 2021-12-03 MED ORDER — FLUTICASONE-SALMETEROL 250-50 MCG/ACT IN AEPB: INHALATION_SPRAY | RESPIRATORY_TRACT | 1 refills | Status: DC

## 2021-12-03 NOTE — Patient Instructions (Signed)
Recommend get your shingles vaccine done at the local pharmacy in about 2 weeks.  It should be a free co-pay, under your Medicare part D.

## 2021-12-03 NOTE — Progress Notes (Signed)
Complete physical exam  Patient: Briana Silva   DOB: 1945-08-25   76 y.o. Female  MRN: [id number]  Subjective:    Chief Complaint  Patient presents with   Annual Exam    Briana Silva is a 76 y.o. female who presents today for a complete physical exam. She reports consuming a general diet.  She tries to stretch and do some weight resistance training regularly.  She generally feels well. She does not have additional problems to discuss today.   Also has a few scattered hyperpigmented macules on her palms.  She was concerned about it.   Most recent fall risk assessment:    12/03/2021   10:04 AM  Fall Risk   Falls in the past year? 0  Number falls in past yr: 0  Injury with Fall? 0  Risk for fall due to : No Fall Risks  Follow up Falls evaluation completed     Most recent depression screenings:    12/03/2021   10:04 AM 09/30/2021   10:28 AM  PHQ 2/9 Scores  PHQ - 2 Score 0 1        Patient Care Team: Agapito Games, MD as PCP - General (Family Medicine) Gabriel Carina, [facility] as Pharmacist (Pharmacist)   Outpatient Medications Prior to Visit  Medication Sig   albuterol (PROAIR HFA) 108 (90 Base) MCG/ACT inhaler Inhale 2 puffs into the lungs every 4 (four) hours as needed for wheezing or shortness of breath.   albuterol (PROVENTIL) (2.5 MG/3ML) 0.083% nebulizer solution Take 3 mLs (2.5 mg total) by nebulization every 4 (four) hours as needed for wheezing or shortness of breath. Dx:J45.41   apixaban (ELIQUIS) 5 MG TABS tablet Take 1 tablet (5 mg total) by mouth 2 (two) times daily.   carvedilol (COREG) 12.5 MG tablet Take 1 tablet (12.5 mg total) by mouth 2 (two) times daily with a meal.   cetirizine (ZYRTEC) 10 MG tablet Take 1 tablet (10 mg total) by mouth daily as needed for allergies or rhinitis.   Cholecalciferol (QC VITAMIN D3) 50 MCG (2000 UT) TABS Take 1 tablet by mouth daily.   digoxin (LANOXIN) 0.125 MG tablet Take 1 tablet (0.125 mg total) by  mouth daily.   ferrous sulfate 324 MG TBEC Take 324 mg by mouth.   furosemide (LASIX) 20 MG tablet Take 1 tablet (20 mg total) by mouth daily.   losartan (COZAAR) 50 MG tablet Take 1 tablet (50 mg total) by mouth daily.   Multiple Vitamins-Minerals (MULTIVITAMIN WITH MINERALS) tablet Take 1 tablet by mouth daily.   Omega-3 Fatty Acids (FISH OIL) 1000 MG CAPS Take 1 capsule by mouth daily.   spironolactone (ALDACTONE) 25 MG tablet Take 1 tablet (25 mg total) by mouth daily.   [DISCONTINUED] fluticasone-salmeterol (ADVAIR) 250-50 MCG/ACT AEPB INHALE 1 DOSE BY MOUTH INTO THE LUNGS TWICE DAILY   No facility-administered medications prior to visit.    ROS        Objective:     BP 132/82 (BP Location: Left Arm, Patient Position: Sitting, Cuff Size: Large)   Pulse 97   Ht 5\' 6"  (1.676 m)   Wt 245 lb 1.3 oz (111.2 kg)   SpO2 100%   BMI 39.56 kg/m    Physical Exam Vitals and nursing note reviewed.  Constitutional:      Appearance: She is well-developed.  HENT:     Head: Normocephalic and atraumatic.     Right Ear: Tympanic membrane, ear canal and external  ear normal.     Left Ear: Tympanic membrane, ear canal and external ear normal.     Nose: Nose normal.     Mouth/Throat:     Pharynx: Oropharynx is clear.  Eyes:     Conjunctiva/sclera: Conjunctivae normal.     Pupils: Pupils are equal, round, and reactive to light.  Neck:     Thyroid: No thyromegaly.  Cardiovascular:     Rate and Rhythm: Normal rate and regular rhythm.     Heart sounds: Normal heart sounds.  Pulmonary:     Effort: Pulmonary effort is normal.     Breath sounds: Normal breath sounds. No wheezing.  Musculoskeletal:     Cervical back: Neck supple.  Lymphadenopathy:     Cervical: No cervical adenopathy.  Skin:    General: Skin is warm and dry.     Coloration: Skin is not pale.  Neurological:     Mental Status: She is alert and oriented to person, place, and time.  Psychiatric:        Behavior:  Behavior normal.      No results found for any visits on 12/03/21.     Assessment & Plan:    Routine Health Maintenance and Physical Exam  Immunization History  Administered Date(s) Administered   COVID-19, mRNA, vaccine(Comirnaty)12 years and older 12/03/2021   Fluad Quad(high Dose 65+) 12/03/2019, 12/20/2020, 10/02/2021   Influenza Split 10/18/2010, 09/26/2011   Influenza, High Dose Seasonal PF 09/25/2016, 12/01/2017   Influenza,inj,Quad PF,6+ Mos 01/10/2013, 09/08/2013, 09/22/2014, 09/27/2015   Moderna Sars-Covid-2 Vaccination 02/24/2019, 03/24/2019, 11/17/2019, 12/03/2019   Pneumococcal Conjugate-13 06/22/2014   Pneumococcal Polysaccharide-23 01/13/2009   Tdap 09/08/2013    Health Maintenance  Topic Date Due   COVID-19 Vaccine (5 - 2023-24 season) 09/13/2021   Zoster Vaccines- Shingrix (1 of 2) 12/30/2021 (Originally 07/04/1964)   Medicare Annual Wellness (AWV)  10/01/2022   Pneumonia Vaccine 78+ Years old  Completed   INFLUENZA VACCINE  Completed   DEXA SCAN  Completed   Hepatitis C Screening  Completed   HPV VACCINES  Aged Out   COLONOSCOPY (Pts 45-1yrs Insurance coverage will need to be confirmed)  Discontinued    Discussed health benefits of physical activity, and encouraged her to engage in regular exercise appropriate for her age and condition.  Problem List Items Addressed This Visit       Other   Hyperlipidemia   Relevant Orders   Hepatic function panel   Lipid Panel w/reflex Direct LDL   Other Visit Diagnoses     Wellness examination    -  Primary   Encounter for immunization       Relevant Orders   Pfizer Fall 2023 Covid-19 Vaccine 46yrs and older (Completed)   Pigmentation abnormality of skin       Relevant Orders   Ambulatory referral to Dermatology   Abnormal liver diagnostic imaging       Relevant Orders   Hepatic function panel   Lipid Panel w/reflex Direct LDL   Mild persistent asthma with acute exacerbation       Relevant Medications    fluticasone-salmeterol (ADVAIR) 250-50 MCG/ACT AEPB     complete physical examination   Keep up a regular exercise program and make sure you are eating a healthy diet Try to eat 4 servings of dairy a day, or if you are lactose intolerant take a calcium with vitamin D daily.  Your vaccines are up to date.  COVID-vaccine given today.  Hyperlipidemia-due to recheck lipid panel.  Plan to recheck liver enzymes as well.  She had some coarse texture to the liver noted on recent CT scan.  Return in about 6 months (around 06/03/2022) for Hypertension.     Nani Gasser, MD

## 2021-12-04 LAB — LIPID PANEL W/REFLEX DIRECT LDL
Cholesterol: 175 mg/dL (ref <200)
HDL: 63 mg/dL (ref >=50)
LDL Cholesterol (Calc): 99 mg/dL (calc)
Non-HDL Cholesterol (Calc): 112 mg/dL (calc) (ref <130)
Total CHOL/HDL Ratio: 2.8 (calc) (ref <5.0)
Triglycerides: 50 mg/dL (ref <150)

## 2021-12-04 LAB — HEPATIC FUNCTION PANEL
AG Ratio: 1.2 (calc) (ref 1.0–2.5)
ALT: 15 U/L (ref 6–29)
AST: 20 U/L (ref 10–35)
Albumin: 3.8 g/dL (ref 3.6–5.1)
Alkaline phosphatase (APISO): 83 U/L (ref 37–153)
Bilirubin, Direct: 0.2 mg/dL (ref 0.0–0.2)
Globulin: 3.1 g/dL (calc) (ref 1.9–3.7)
Indirect Bilirubin: 0.9 mg/dL (calc) (ref 0.2–1.2)
Total Bilirubin: 1.1 mg/dL (ref 0.2–1.2)
Total Protein: 6.9 g/dL (ref 6.1–8.1)

## 2021-12-04 NOTE — Progress Notes (Signed)
Your lab work is within acceptable range and there are no concerning findings.   ?

## 2021-12-23 NOTE — Progress Notes (Signed)
HPI: FU CHF. Patient previously had congestive heart failure in [place]. She was told her heart was functioning at "30%". She was treated with medications with improvement. Apparently stress test normal but no records available. Echocardiogram January 2017 showed normal LV systolic function, mild left ventricular hypertrophy and biatrial enlargement. Previously seen at [facility] and reduced LV function felt possibly related to atrial tachycardia. Echo 11/22 showed EF 35-40, mild RAE, moderate MR, mild to moderate TR. Nuclear study 12/22 showed EF 38 and no ischemia.  Monitor December 2022 showed sinus rhythm with occasional PAC, short burst of PAT, occasional PVC and rare couplet.  Coronary CTA January 2023 showed calcium score 0; study limited in distal right coronary artery, PDA and mid distal circumflex/ramus were not visualized.  Interpreted as could not rule out calcified plaque in the mid LAD.  Note there was pulmonary artery enlargement suggestive of pulmonary hypertension and findings suspicious for cirrhosis.  Echocardiogram September 2023 showed ejection fraction 45 to 50%, biatrial enlargement, moderate mitral regurgitation, moderate tricuspid regurgitation.  Patient in atrial fibrillation at last office visit.  Since last seen, she has dyspnea with more vigorous activities.  No orthopnea, PND, pedal edema, chest pain, palpitations or syncope.  No bleeding.  Current Outpatient Medications  Medication Sig Dispense Refill   albuterol (PROAIR HFA) 108 (90 Base) MCG/ACT inhaler Inhale 2 puffs into the lungs every 4 (four) hours as needed for wheezing or shortness of breath. 6.7 g 5   albuterol (PROVENTIL) (2.5 MG/3ML) 0.083% nebulizer solution Take 3 mLs (2.5 mg total) by nebulization every 4 (four) hours as needed for wheezing or shortness of breath. Dx:J45.41 75 mL 6   apixaban (ELIQUIS) 5 MG TABS tablet Take 1 tablet (5 mg total) by mouth 2 (two) times daily. 60 tablet 6   carvedilol  (COREG) 12.5 MG tablet Take 1 tablet (12.5 mg total) by mouth 2 (two) times daily with a meal. 180 tablet 3   cetirizine (ZYRTEC) 10 MG tablet Take 1 tablet (10 mg total) by mouth daily as needed for allergies or rhinitis. 30 tablet 5   Cholecalciferol (QC VITAMIN D3) 50 MCG (2000 UT) TABS Take 1 tablet by mouth daily.     digoxin (LANOXIN) 0.125 MG tablet Take 1 tablet (0.125 mg total) by mouth daily. 90 tablet 3   ferrous sulfate 324 MG TBEC Take 324 mg by mouth.     fluticasone-salmeterol (ADVAIR) 250-50 MCG/ACT AEPB INHALE 1 DOSE BY MOUTH INTO THE LUNGS TWICE DAILY 180 each 1   furosemide (LASIX) 20 MG tablet Take 1 tablet (20 mg total) by mouth daily. 90 tablet 3   losartan (COZAAR) 50 MG tablet Take 1 tablet (50 mg total) by mouth daily. 90 tablet 3   Multiple Vitamins-Minerals (MULTIVITAMIN WITH MINERALS) tablet Take 1 tablet by mouth daily.     Omega-3 Fatty Acids (FISH OIL) 1000 MG CAPS Take 1 capsule by mouth daily.     spironolactone (ALDACTONE) 25 MG tablet Take 1 tablet (25 mg total) by mouth daily. 90 tablet 3   No current facility-administered medications for this visit.     Past Medical History:  Diagnosis Date   Abnormal echocardiogram 09/18/2016   Mild LVH, EF 55-65%, moderately dilated LA and RA   Allergy    Asthma    Asthma    Atrial tachycardia    Cardiomyopathy ([place])    CHF (congestive heart failure) (HCC)    Depression    Hyperlipidemia  Hypertension    Obesity     Past Surgical History:  Procedure Laterality Date   LEFT OOPHORECTOMY  1967   TUBAL LIGATION  1967    Social History   Socioeconomic History   Marital status: Widowed    Spouse name: Not on file   Number of children: 2   Years of education: 14   Highest education level: Some college, no degree  Occupational History   Occupation: Retired.   Tobacco Use   Smoking status: Never   Smokeless tobacco: Never  Vaping Use   Vaping Use: Never used  Substance and Sexual Activity   Alcohol  use: No   Drug use: No   Sexual activity: Not on file  Other Topics Concern   Not on file  Social History Narrative   Lives alone. She had two children, one has deceased. She enjoys writing and doing puzzles. She did get a new puppy recently.   Social Determinants of Health   Financial Resource Strain: Low Risk  (09/30/2021)   Overall Financial Resource Strain (CARDIA)    Difficulty of Paying Living Expenses: Not hard at all  Food Insecurity: No Food Insecurity (09/30/2021)   Hunger Vital Sign    Worried About Running Out of Food in the Last Year: Never true    Ran Out of Food in the Last Year: Never true  Transportation Needs: No Transportation Needs (09/30/2021)   PRAPARE - Hydrologist (Medical): No    Lack of Transportation (Non-Medical): No  Physical Activity: Inactive (09/30/2021)   Exercise Vital Sign    Days of Exercise per Week: 0 days    Minutes of Exercise per Session: 0 min  Stress: No Stress Concern Present (09/30/2021)   [place]    Feeling of Stress : Not at all  Social Connections: Socially Isolated (09/30/2021)   Social Connection and Isolation Panel [NHANES]    Frequency of Communication with Friends and Family: More than three times a week    Frequency of Social Gatherings with Friends and Family: Once a week    Attends Religious Services: Never    Marine scientist or Organizations: No    Attends Archivist Meetings: Never    Marital Status: Widowed  Intimate Partner Violence: Not At Risk (09/30/2021)   Humiliation, Afraid, Rape, and Kick questionnaire    Fear of Current or Ex-Partner: No    Emotionally Abused: No    Physically Abused: No    Sexually Abused: No    Family History  Problem Relation Age of Onset   Hypertension Mother    Alzheimer's disease Mother    Cancer Father    Pancreatic cancer Father    Hypertension Father    Other Sister         brain tumor   Diabetes Brother    Non-Hodgkin's lymphoma Brother    Leukemia Brother    Cancer Daughter 87       Brain tumor    ROS: no fevers or chills, productive cough, hemoptysis, dysphasia, odynophagia, melena, hematochezia, dysuria, hematuria, rash, seizure activity, orthopnea, PND, pedal edema, claudication. Remaining systems are negative.  Physical Exam: Well-developed well-nourished in no acute distress.  Skin is warm and dry.  HEENT is normal.  Neck is supple.  Chest is clear to auscultation with normal expansion.  Cardiovascular exam is irregular Abdominal exam nontender or distended. No masses palpated. Extremities show no edema. neuro  grossly intact  ECG-atrial fibrillation with PVCs or aberrantly conducted beats, cannot rule out anterior infarct.  Personally reviewed  A/P  1 atrial fibrillation-plan to continue apixaban 5 mg twice daily.  Continue digoxin and carvedilol.  Will arrange cardioversion in 4 to 6 weeks (patient missed a dose of apixaban she thinks).  If she does not hold sinus rhythm we will consider referral for ablation versus antiarrhythmic.  Addendum-prior to leaving the office the patient decided against proceeding with cardioversion.  She would prefer rate control and anticoagulation at this point.  I explained that the longer she is in atrial fibrillation there is less likely we would be able to restore sinus rhythm.  I will see her back in 3 months to make sure that she is stable and we can reconsider cardioversion at that point.  2 nonischemic cardiomyopathy-plan to continue losartan and carvedilol.  Note previous cardiac CTA suboptimal but calcium score was 0 and follow-up nuclear study showed no ischemia.  Heart rate controlled on previous monitor.  3 history of atrial tachycardia-continue beta-blocker.  4 hypertension-blood pressure elevated; increase losartan to 100 mg daily and follow.  Check potassium and renal function in 2 weeks.  5  hyperlipidemia-continue statin.  6 chronic systolic congestive heart failure-continue Lasix and spironolactone at present dose.  She is having difficulties affording medications and I have therefore not not added [place].  7 obstructive sleep apnea-I have encouraged continued use of CPAP.  [name], MD

## 2022-01-01 ENCOUNTER — Encounter: Payer: Self-pay | Admitting: Cardiology

## 2022-01-01 ENCOUNTER — Ambulatory Visit: Payer: Medicare Other | Admitting: Cardiology

## 2022-01-01 VITALS — BP 138/91 | HR 79 | Ht 66.0 in | Wt 245.4 lb

## 2022-01-01 DIAGNOSIS — I4819 Other persistent atrial fibrillation: Secondary | ICD-10-CM | POA: Diagnosis not present

## 2022-01-01 DIAGNOSIS — E7800 Pure hypercholesterolemia, unspecified: Secondary | ICD-10-CM | POA: Diagnosis not present

## 2022-01-01 DIAGNOSIS — I4719 Other supraventricular tachycardia: Secondary | ICD-10-CM | POA: Diagnosis not present

## 2022-01-01 DIAGNOSIS — I429 Cardiomyopathy, unspecified: Secondary | ICD-10-CM | POA: Diagnosis not present

## 2022-01-01 DIAGNOSIS — I5042 Chronic combined systolic (congestive) and diastolic (congestive) heart failure: Secondary | ICD-10-CM

## 2022-01-01 DIAGNOSIS — I10 Essential (primary) hypertension: Secondary | ICD-10-CM

## 2022-01-01 MED ORDER — LOSARTAN POTASSIUM 100 MG PO TABS: 100.0000 mg | ORAL_TABLET | Freq: Every day | ORAL | 3 refills | Status: DC

## 2022-01-01 NOTE — Patient Instructions (Addendum)
Medication Instructions:   INCREASE LOSARTAN TO 100 MG ONCE DAILY= 2 OF THE 50 MG TABLETS ONCE DAILY  *If you need a refill on your cardiac medications before your next appointment, please call your pharmacy*    Your physician recommends that you return for lab work in:ONE WEEK-DO NOT NEED TO FAST   Follow-Up: At [facility], you and your health needs are our priority.  As part of our continuing mission to provide you with exceptional heart care, we have created designated Provider Care Teams.  These Care Teams include your primary Cardiologist (physician) and Advanced Practice Providers (APPs -  Physician Assistants and Nurse Practitioners) who all work together to provide you with the care you need, when you need it.  We recommend signing up for the patient portal called "MyChart".  Sign up information is provided on this After Visit Summary.  MyChart is used to connect with patients for Virtual Visits (Telemedicine).  Patients are able to view lab/test results, encounter notes, upcoming appointments, etc.  Non-urgent messages can be sent to your provider as well.   To learn more about what you can do with MyChart, go to [email or web address].    Your next appointment:   3 month(s)  The format for your next appointment:   In Person  Provider:   [name], MD

## 2022-01-29 ENCOUNTER — Ambulatory Visit (HOSPITAL_COMMUNITY): Admit: 2022-01-29 | Payer: Medicare Other | Admitting: Cardiology

## 2022-01-29 ENCOUNTER — Encounter (HOSPITAL_COMMUNITY): Payer: Self-pay

## 2022-01-29 SURGERY — CARDIOVERSION
Anesthesia: Monitor Anesthesia Care

## 2022-02-27 ENCOUNTER — Telehealth: Payer: Medicare Other

## 2022-02-27 ENCOUNTER — Other Ambulatory Visit: Payer: Medicare Other | Admitting: Pharmacist

## 2022-02-27 DIAGNOSIS — R609 Edema, unspecified: Secondary | ICD-10-CM

## 2022-02-27 DIAGNOSIS — J4531 Mild persistent asthma with (acute) exacerbation: Secondary | ICD-10-CM

## 2022-02-27 DIAGNOSIS — I10 Essential (primary) hypertension: Secondary | ICD-10-CM

## 2022-02-27 DIAGNOSIS — R600 Localized edema: Secondary | ICD-10-CM

## 2022-02-27 DIAGNOSIS — I5042 Chronic combined systolic (congestive) and diastolic (congestive) heart failure: Secondary | ICD-10-CM

## 2022-02-27 DIAGNOSIS — I5032 Chronic diastolic (congestive) heart failure: Secondary | ICD-10-CM

## 2022-02-27 NOTE — Progress Notes (Signed)
02/27/2022 Name: Briana Silva MRN: [id number] DOB: Dec 17, 1945  Chief Complaint  Patient presents with   Hypertension   Medication Assistance    Briana Silva is a 77 y.o. year old female who presented for a telephone visit.   They were referred to the pharmacist by their PCP for assistance in managing hypertension, hyperlipidemia, and medication access.   Patient brings up the following in regards to her medication: Patient requesting more albuterol nebs refill. Furosemide is low due to advised to take 7m occasionally Losartan is completely out, instructed to be taking 1092mdaily per cardiology which was updated on medication list as "no print" instead of RX.   Subjective:  Care Team: Primary Care Provider: MeHali MarryMD ; Next Scheduled Visit: 06/03/22 Cardiologist: Dr CrStanford BreedNext Scheduled Visit: 04/02/22  Medication Access/Adherence  [facility]hone: [phone]: [id number] [place] [id number]: [phone]: [phone] Patient reports affordability concerns with their medications: Yes , eliquis and advair Patient reports access/transportation concerns to their pharmacy: No  Patient reports adherence concerns with their medications:  No , with exception of dose increase/running out of medication with losartan.   Hypertension:  Current medications: completely out of losartan for ~4 days.  Medications previously tried:    Patient does not have a validated, automated, upper arm home BP cuff - uses wrist cuff Current blood pressure readings readings: 124/81, 134/85, 142/91, off of losartan.  105/67 HR 59 (on feb 2nd), 137/65 HR 85, but was running out of tablets so had reduced tablets. Large inconsistencies and wide BP  variability noted. Highest 156/83  Patient denies hypotensive s/sx including dizziness, lightheadedness.  Patient denies hypertensive symptoms including headache, chest pain, shortness of breath   Atrial Fibrillation:  Current medications:  Rate Control: carvedilol 12.12m76mID Rhythm Control: digoxin 0.1212m48miy Anticoagulation Regimen: eliquis 12mg 41m   Current medication access support: none at present   Objective:  Lab Results  Component Value Date   HGBA1C 5.5 06/20/2019    Lab Results  Component Value Date   CREATININE 0.90 10/14/2021   BUN 15 10/14/2021   NA 141 10/14/2021   K 4.7 10/14/2021   CL 104 10/14/2021   CO2 27 10/14/2021    Lab Results  Component Value Date   CHOL 175 12/03/2021   HDL 63 12/03/2021   LDLCALC 99 12/03/2021   TRIG 50 12/03/2021   CHOLHDL 2.8 12/03/2021    Medications Reviewed Today     Reviewed by KlineDarius Bump (Pharmacist) on 02/27/22 at 1123  Med List Status: <None>   Medication Order Taking? Sig Documenting Provider Last Dose Status Informant  albuterol (PROAIR HFA) 108 (90 Base) MCG/ACT inhaler [id number] 2 puffs into the lungs every 4 (four) hours as needed for wheezing or shortness of breath. MetheHali MarryTaking Active   albuterol (PROVENTIL) (2.5 MG/3ML) 0.083% nebulizer solution [id number] 3 mLs (2.5 mg total) by nebulization every 4 (four) hours as needed for wheezing or shortness of breath. Dx:J45.41 MetheHali MarryTaking Active   apixaban (ELIQUIS) 5 MG TABS tablet [id number] 1 tablet (5 mg total) by mouth 2 (two) times daily. CrensLelon PerlaTaking Active   carvedilol (COREG) 12.5 MG tablet [id number] 1  tablet (12.5 mg total) by mouth 2 (two) times daily with a meal. Stanford Breed, Denice Bors, MD Taking Active   cetirizine (ZYRTEC) 10 MG tablet [id number] Yes Take 1 tablet (10 mg total) by mouth daily as needed for allergies or rhinitis. Hali Marry, MD  Taking Active   Cholecalciferol (QC VITAMIN D3) 50 MCG (2000 UT) TABS [id number] Yes Take 1 tablet by mouth daily. [provider] Taking Active   digoxin (LANOXIN) 0.125 MG tablet [id number] Yes Take 1 tablet (0.125 mg total) by mouth daily. Lelon Perla, MD Taking Active   ferrous sulfate 324 MG TBEC [id number] Yes Take 324 mg by mouth. [provider] Taking Active   fluticasone-salmeterol (ADVAIR) 250-50 MCG/ACT AEPB [id number] Yes INHALE 1 DOSE BY MOUTH INTO THE LUNGS TWICE DAILY Hali Marry, MD Taking Active   furosemide (LASIX) 20 MG tablet [id number] Yes Take 1 tablet (20 mg total) by mouth daily. Lelon Perla, MD Taking Active   losartan (COZAAR) 100 MG tablet [id number] Yes Take 1 tablet (100 mg total) by mouth daily. Lelon Perla, MD Taking Active   Multiple Vitamins-Minerals (MULTIVITAMIN WITH MINERALS) tablet [id number] Yes Take 1 tablet by mouth daily. [provider] Taking Active   Omega-3 Fatty Acids (FISH OIL) 1000 MG CAPS [id number] Yes Take 1 capsule by mouth daily. [provider] Taking Active   spironolactone (ALDACTONE) 25 MG tablet [id number] Yes Take 1 tablet (25 mg total) by mouth daily. Lelon Perla, MD Taking Active               Assessment/Plan:   Hypertension: - Currently uncontrolled - Reviewed appropriate blood pressure monitoring technique and reviewed goal blood pressure. Recommended to check home blood pressure and heart rate daily - Recommend to obtain [employer] brand, upper arm BP cuff and check pressures for our next call - Patient ran out of losartan due to cardiologist verbally increasing dose by taking more tablets, but patient states did not get an updated RX at pharmacy? Will pend losartan 112m daily for PCP to review/send.     Atrial Fibrillation: - Currently controlled - Reviewed importance of adherence to anticoagulant for stroke prevention. - Recommend to continue current regimen. -  Counseled patient on eliquis, s/sx of bleeding, mechanism of action, and when to seek medical attention. - Assessed eligibility for LIS Extra Help - patient states her monthly income exceeds threshold due to retirement income. Therefore will coordinate rx med assistance team to provide [facility] for manufacturer patient assistance for Eliquis, counseled patient that it does require a percentage out-of-pocket spend prior to eligibility. She verbalized understanding.     Follow Up Plan: 2-3 weeks for BP checks  KLarinda Buttery PharmD Clinical Pharmacist CDoctors Medical Center - San PabloPrimary Care At MSelect Specialty Hospital - [facility] [phone]

## 2022-02-28 MED ORDER — ALBUTEROL SULFATE (2.5 MG/3ML) 0.083% IN NEBU: 2.5000 mg | INHALATION_SOLUTION | RESPIRATORY_TRACT | 6 refills | Status: DC | PRN

## 2022-02-28 MED ORDER — FUROSEMIDE 20 MG PO TABS: 20.0000 mg | ORAL_TABLET | Freq: Every day | ORAL | 3 refills | Status: DC

## 2022-02-28 MED ORDER — LOSARTAN POTASSIUM 100 MG PO TABS: 100.0000 mg | ORAL_TABLET | Freq: Every day | ORAL | 3 refills | Status: DC

## 2022-02-28 NOTE — Progress Notes (Signed)
Yes I will mail to pt.  Thanks!  Sandre Kitty Rx Patient Advocate

## 2022-02-28 NOTE — Progress Notes (Signed)
Meds ordered this encounter  Medications   albuterol (PROVENTIL) (2.5 MG/3ML) 0.083% nebulizer solution    Sig: Take 3 mLs (2.5 mg total) by nebulization every 4 (four) hours as needed for wheezing or shortness of breath. Dx:J45.41    Dispense:  75 mL    Refill:  6   furosemide (LASIX) 20 MG tablet    Sig: Take 1 tablet (20 mg total) by mouth daily.    Dispense:  90 tablet    Refill:  3   losartan (COZAAR) 100 MG tablet    Sig: Take 1 tablet (100 mg total) by mouth daily.    Dispense:  90 tablet    Refill:  3

## 2022-03-27 NOTE — Progress Notes (Signed)
HPI: FU CHF. Patient previously had congestive heart failure in [place]. She was told her heart was functioning at "30%". She was treated with medications with improvement. Apparently stress test normal but no records available. Echocardiogram January 2017 showed normal LV systolic function, mild left ventricular hypertrophy and biatrial enlargement. Previously seen at [facility] and reduced LV function felt possibly related to atrial tachycardia. Echo 11/22 showed EF 35-40, mild RAE, moderate MR, mild to moderate TR. Nuclear study 12/22 showed EF 38 and no ischemia.  Monitor December 2022 showed sinus rhythm with occasional PAC, short burst of PAT, occasional PVC and rare couplet.  Coronary CTA January 2023 showed calcium score 0; study limited in distal right coronary artery, PDA and mid distal circumflex/ramus were not visualized.  Interpreted as could not rule out calcified plaque in the mid LAD.  Note there was pulmonary artery enlargement suggestive of pulmonary hypertension and findings suspicious for cirrhosis.  Echocardiogram September 2023 showed ejection fraction 45 to 50%, biatrial enlargement, moderate mitral regurgitation, moderate tricuspid regurgitation.  Cardioversion of atrial fibrillation had been planned at last office visit in December.  However patient then decided to continue with rate control and anticoagulation.  Since last seen, she has some dyspnea on exertion but no orthopnea, PND, pedal edema, chest pain or syncope.  No palpitations.  No bleeding.  Current Outpatient Medications  Medication Sig Dispense Refill   albuterol (PROAIR HFA) 108 (90 Base) MCG/ACT inhaler Inhale 2 puffs into the lungs every 4 (four) hours as needed for wheezing or shortness of breath. 6.7 g 5   albuterol (PROVENTIL) (2.5 MG/3ML) 0.083% nebulizer solution Take 3 mLs (2.5 mg total) by nebulization every 4 (four) hours as needed for wheezing or shortness of breath. Dx:J45.41 75 mL 6   apixaban  (ELIQUIS) 5 MG TABS tablet Take 1 tablet (5 mg total) by mouth 2 (two) times daily. 60 tablet 6   carvedilol (COREG) 12.5 MG tablet Take 1 tablet (12.5 mg total) by mouth 2 (two) times daily with a meal. 180 tablet 3   cetirizine (ZYRTEC) 10 MG tablet Take 1 tablet (10 mg total) by mouth daily as needed for allergies or rhinitis. 30 tablet 5   Cholecalciferol (QC VITAMIN D3) 50 MCG (2000 UT) TABS Take 1 tablet by mouth daily.     digoxin (LANOXIN) 0.125 MG tablet Take 1 tablet (0.125 mg total) by mouth daily. 90 tablet 3   ferrous sulfate 324 MG TBEC Take 324 mg by mouth.     fluticasone-salmeterol (ADVAIR) 250-50 MCG/ACT AEPB INHALE 1 DOSE BY MOUTH INTO THE LUNGS TWICE DAILY 180 each 1   furosemide (LASIX) 20 MG tablet Take 1 tablet (20 mg total) by mouth daily. 90 tablet 3   losartan (COZAAR) 100 MG tablet Take 1 tablet (100 mg total) by mouth daily. 90 tablet 3   Omega-3 Fatty Acids (FISH OIL) 1000 MG CAPS Take 1 capsule by mouth daily.     Prenatal Vit-Fe Fumarate-FA (PRENATAL MULTIVITAMIN) TABS tablet Take 1 tablet by mouth daily at 12 noon.     spironolactone (ALDACTONE) 25 MG tablet Take 1 tablet (25 mg total) by mouth daily. 90 tablet 3   No current facility-administered medications for this visit.     Past Medical History:  Diagnosis Date   Abnormal echocardiogram 09/18/2016   Mild LVH, EF 55-65%, moderately dilated LA and RA   Allergy    Asthma    Asthma    Atrial tachycardia  Cardiomyopathy ([place])    CHF (congestive heart failure) ([place])    Depression    Hyperlipidemia    Hypertension    Obesity     Past Surgical History:  Procedure Laterality Date   LEFT OOPHORECTOMY  1967   TUBAL LIGATION  1967    Social History   Socioeconomic History   Marital status: Widowed    Spouse name: Not on file   Number of children: 2   Years of education: 14   Highest education level: Some college, no degree  Occupational History   Occupation: Retired.   Tobacco Use   Smoking  status: Never   Smokeless tobacco: Never  Vaping Use   Vaping Use: Never used  Substance and Sexual Activity   Alcohol use: No   Drug use: No   Sexual activity: Not on file  Other Topics Concern   Not on file  Social History Narrative   Lives alone. She had two children, one has deceased. She enjoys writing and doing puzzles. She did get a new puppy recently.   Social Determinants of Health   Financial Resource Strain: Low Risk  (09/30/2021)   Overall Financial Resource Strain (CARDIA)    Difficulty of Paying Living Expenses: Not hard at all  Food Insecurity: No Food Insecurity (09/30/2021)   Hunger Vital Sign    Worried About Running Out of Food in the Last Year: Never true    Ran Out of Food in the Last Year: Never true  Transportation Needs: No Transportation Needs (09/30/2021)   PRAPARE - Hydrologist (Medical): No    Lack of Transportation (Non-Medical): No  Physical Activity: Inactive (09/30/2021)   Exercise Vital Sign    Days of Exercise per Week: 0 days    Minutes of Exercise per Session: 0 min  Stress: No Stress Concern Present (09/30/2021)   [place]    Feeling of Stress : Not at all  Social Connections: Socially Isolated (09/30/2021)   Social Connection and Isolation Panel [NHANES]    Frequency of Communication with Friends and Family: More than three times a week    Frequency of Social Gatherings with Friends and Family: Once a week    Attends Religious Services: Never    Marine scientist or Organizations: No    Attends Archivist Meetings: Never    Marital Status: Widowed  Intimate Partner Violence: Not At Risk (09/30/2021)   Humiliation, Afraid, Rape, and Kick questionnaire    Fear of Current or Ex-Partner: No    Emotionally Abused: No    Physically Abused: No    Sexually Abused: No    Family History  Problem Relation Age of Onset   Hypertension  Mother    Alzheimer's disease Mother    Cancer Father    Pancreatic cancer Father    Hypertension Father    Other Sister        brain tumor   Diabetes Brother    Non-Hodgkin's lymphoma Brother    Leukemia Brother    Cancer Daughter 50       Brain tumor    ROS: no fevers or chills, productive cough, hemoptysis, dysphasia, odynophagia, melena, hematochezia, dysuria, hematuria, rash, seizure activity, orthopnea, PND, pedal edema, claudication. Remaining systems are negative.  Physical Exam: Well-developed well-nourished in no acute distress.  Skin is warm and dry.  HEENT is normal.  Neck is supple.  Chest is clear to auscultation  with normal expansion.  Cardiovascular exam is irregular Abdominal exam nontender or distended. No masses palpated. Extremities show no edema. neuro grossly intact   A/P  1 atrial fibrillation-patient remains in atrial fibrillation on examination today.  Continue carvedilol and digoxin for rate control.  Continue apixaban.  I again discussed cardioversion with the patient today.  She is concerned about the experience and declines.  I explained that the longer she is in atrial fibrillation the more difficult it may be to reestablish sinus rhythm in the future.  She would prefer rate control and anticoagulation.  2 history of nonischemic cardiomyopathy-continue losartan and carvedilol.  She had been on Entresto in the past but discontinued due to expense.  She now states that it is similar to losartan and when she runs out of this medication we will begin Entresto 24/26 twice daily.  Check potassium and renal function 1 week later.  As outlined in previous notes CTA was suboptimal to rule out coronary disease but calcium score was 0 and follow-up nuclear study showed no ischemia.  Heart rate was controlled on present medications on previous monitor.  3 atrial tachycardia-no recent episodes.  Continue beta-blocker.  4 chronic systolic congestive heart  failure-continue Lasix and spironolactone at present dose.  I did not add [place] or Vania Rea previously due to expense.  5 hypertension-blood pressure controlled.  Continue present medications.  6 hyperlipidemia-continue statin.  7 obstructive sleep apnea-patient needs to use CPAP.  Kirk Ruths, MD

## 2022-04-02 ENCOUNTER — Encounter: Payer: Self-pay | Admitting: Cardiology

## 2022-04-02 ENCOUNTER — Ambulatory Visit: Payer: Medicare Other | Admitting: Cardiology

## 2022-04-02 VITALS — BP 142/88 | HR 86 | Ht 66.0 in | Wt 233.1 lb

## 2022-04-02 DIAGNOSIS — I4719 Other supraventricular tachycardia: Secondary | ICD-10-CM

## 2022-04-02 DIAGNOSIS — E7800 Pure hypercholesterolemia, unspecified: Secondary | ICD-10-CM

## 2022-04-02 DIAGNOSIS — I10 Essential (primary) hypertension: Secondary | ICD-10-CM | POA: Diagnosis not present

## 2022-04-02 DIAGNOSIS — I429 Cardiomyopathy, unspecified: Secondary | ICD-10-CM

## 2022-04-02 DIAGNOSIS — I5042 Chronic combined systolic (congestive) and diastolic (congestive) heart failure: Secondary | ICD-10-CM

## 2022-04-02 DIAGNOSIS — I4819 Other persistent atrial fibrillation: Secondary | ICD-10-CM

## 2022-04-02 MED ORDER — APIXABAN 5 MG PO TABS: 5.0000 mg | ORAL_TABLET | Freq: Two times a day (BID) | ORAL | 6 refills | Status: DC

## 2022-04-02 MED ORDER — CARVEDILOL 12.5 MG PO TABS: 12.5000 mg | ORAL_TABLET | Freq: Two times a day (BID) | ORAL | 3 refills | Status: DC

## 2022-04-02 NOTE — Patient Instructions (Signed)
Medication Instructions:   CONTACT us WHEN YOU ARE CLOSE TO RUNNING OUT OF LOSARTAN  *If you need a refill on your cardiac medications before your next appointment, please call your pharmacy*   Follow-Up: At [facility], you and your health needs are our priority.  As part of our continuing mission to provide you with exceptional heart care, we have created designated Provider Care Teams.  These Care Teams include your primary Cardiologist (physician) and Advanced Practice Providers (APPs -  Physician Assistants and Nurse Practitioners) who all work together to provide you with the care you need, when you need it.  We recommend signing up for the patient portal called "MyChart".  Sign up information is provided on this After Visit Summary.  MyChart is used to connect with patients for Virtual Visits (Telemedicine).  Patients are able to view lab/test results, encounter notes, upcoming appointments, etc.  Non-urgent messages can be sent to your provider as well.   To learn more about what you can do with MyChart, go to [email or web address].    Your next appointment:   6 month(s)  Provider:   Kirk Ruths, MD

## 2022-05-05 ENCOUNTER — Telehealth: Payer: Self-pay | Admitting: *Deleted

## 2022-05-05 DIAGNOSIS — I5042 Chronic combined systolic (congestive) and diastolic (congestive) heart failure: Secondary | ICD-10-CM

## 2022-05-05 MED ORDER — SACUBITRIL-VALSARTAN 24-26 MG PO TABS
1.0000 | ORAL_TABLET | Freq: Two times a day (BID) | ORAL | 6 refills | Status: DC
Start: 2022-05-05 — End: 2022-10-08

## 2022-05-05 NOTE — Telephone Encounter (Signed)
Spoke with pt, per dr Ludwig Clarks last office visit the patient was changing from losartan to entresto 24/26 mg twice daily. She reports she is ready to change. New script sent to the pharmacy and Lab orders mailed to the pt for bmp in one week.

## 2022-05-07 ENCOUNTER — Telehealth: Payer: Self-pay | Admitting: Pharmacist

## 2022-05-07 NOTE — Progress Notes (Signed)
Patient appearing on report for quality metrics: med adherence diabetes (MAD). Appears farxiga was prescribed in 2023 but discontinued likely related to cost. Of note, recent change from losartan to entresto by cardiologist.  Outreached patient to discuss medication management and support through medication changes. Left voicemail for patient to return my call at their convenience.   Lynnda Shields, PharmD, BCPS Clinical Pharmacist [facility]

## 2022-05-20 DIAGNOSIS — I5042 Chronic combined systolic (congestive) and diastolic (congestive) heart failure: Secondary | ICD-10-CM | POA: Diagnosis not present

## 2022-05-21 ENCOUNTER — Encounter: Payer: Self-pay | Admitting: *Deleted

## 2022-05-21 LAB — BASIC METABOLIC PANEL
BUN/Creatinine Ratio: 15 (ref 12–28)
BUN: 14 mg/dL (ref 8–27)
CO2: 25 mmol/L (ref 20–29)
Calcium: 9.6 mg/dL (ref 8.7–10.3)
Chloride: 100 mmol/L (ref 96–106)
Creatinine, Ser: 0.93 mg/dL (ref 0.57–1.00)
Glucose: 95 mg/dL (ref 70–99)
Potassium: 4.8 mmol/L (ref 3.5–5.2)
Sodium: 141 mmol/L (ref 134–144)
eGFR: 63 mL/min/{1.73_m2} (ref >59)

## 2022-06-03 ENCOUNTER — Encounter: Payer: Self-pay | Admitting: Family Medicine

## 2022-06-03 ENCOUNTER — Ambulatory Visit (INDEPENDENT_AMBULATORY_CARE_PROVIDER_SITE_OTHER): Payer: Medicare Other | Admitting: Family Medicine

## 2022-06-03 VITALS — BP 132/68 | HR 74 | Ht 66.0 in | Wt 242.0 lb

## 2022-06-03 DIAGNOSIS — J4540 Moderate persistent asthma, uncomplicated: Secondary | ICD-10-CM | POA: Diagnosis not present

## 2022-06-03 DIAGNOSIS — R7301 Impaired fasting glucose: Secondary | ICD-10-CM | POA: Diagnosis not present

## 2022-06-03 DIAGNOSIS — I5032 Chronic diastolic (congestive) heart failure: Secondary | ICD-10-CM

## 2022-06-03 DIAGNOSIS — I4819 Other persistent atrial fibrillation: Secondary | ICD-10-CM

## 2022-06-03 DIAGNOSIS — I10 Essential (primary) hypertension: Secondary | ICD-10-CM

## 2022-06-03 DIAGNOSIS — Z7184 Encounter for health counseling related to travel: Secondary | ICD-10-CM

## 2022-06-03 MED ORDER — AZITHROMYCIN 250 MG PO TABS: ORAL_TABLET | ORAL | 0 refills | Status: DC

## 2022-06-03 MED ORDER — CEFDINIR 300 MG PO CAPS
300.0000 mg | ORAL_CAPSULE | Freq: Two times a day (BID) | ORAL | 0 refills | Status: DC
Start: 2022-06-03 — End: 2022-10-06

## 2022-06-03 NOTE — Progress Notes (Addendum)
Established Patient Office Visit  Subjective   Patient ID: Briana Silva, female    DOB: 1945/09/01  Age: 77 y.o. MRN: [id number]  Chief Complaint  Patient presents with   Hypertension    She wanted to know if she should still continue taking the Spironolactone 25 mg.     HPI  Hypertension- Pt denies chest pain, SOB, dizziness, or heart palpitations.  Taking meds as directed w/o problems.  Denies medication side effects.    Impaired fasting glucose-no increased thirst or urination. No symptoms consistent with hypoglycemia.  Lab Results  Component Value Date   HGBA1C 5.5 06/20/2019    Been dealing with some sinus congestion over the last couple of weeks from seasonal allergies.  Also going to be traveling to the [place] in August for a cruise and wanted to know if she needs to take anything to be prepared for travel in that local area.  CHF -taking medications regularly.  Denies any excess fluid retention.   Stable on current regimen ROS    Objective:     BP 132/68   Pulse 74   Ht 5\' 6"  (1.676 m)   Wt 242 lb (109.8 kg)   SpO2 94%   BMI 39.06 kg/m    Physical Exam Vitals and nursing note reviewed.  Constitutional:      Appearance: She is well-developed.  HENT:     Head: Normocephalic and atraumatic.  Cardiovascular:     Rate and Rhythm: Normal rate and regular rhythm.     Heart sounds: Normal heart sounds.  Pulmonary:     Effort: Pulmonary effort is normal.     Breath sounds: Normal breath sounds.  Skin:    General: Skin is warm and dry.  Neurological:     Mental Status: She is alert and oriented to person, place, and time.  Psychiatric:        Behavior: Behavior normal.      No results found for any visits on 06/03/22.    The 10-year ASCVD risk score (Arnett DK, et al., 2019) is: 17.4%    Assessment & Plan:   Problem List Items Addressed This Visit       Cardiovascular and Mediastinum   Persistent atrial fibrillation (HCC)     On Eliquis but it is quite costly we might be able to see if she might qualify for some patient assistance through the drug manufacture      Essential hypertension - Primary    Well controlled. Continue current regimen. Follow up in  39mo       Congestive heart failure (HCC)    No sign of volume overload today. .  She does need to continue the spironolactone she says she doing well and recent BMP was normal.        Respiratory   Moderate persistent asthma    Doing well with her Advair.        Endocrine   IFG (impaired fasting glucose)     Other   Severe obesity (BMI 35.0-39.9) with comorbidity (HCC)    Weight down from December. Continue to stay active.       Other Visit Diagnoses     Travel advice encounter           Did discuss getting her pneumonia vaccine updated encouraged her to consider getting the Prevnar 20.  Travel advise-did discuss making sure that all her vaccines are up-to-date she should consider getting an updated COVID-vaccine as well as the  updated pneumonia vaccine.  Also given a prescription for azithromycin to use for travel for diarrhea to use PRN.   Return in about 6 months (around 12/04/2022) for Hypertension and labs .    Nani Gasser, MD

## 2022-06-03 NOTE — Progress Notes (Signed)
Pharmacy notified us that azithromycin was contraindicated with her digoxin.  New prescription sent for [facility].

## 2022-06-03 NOTE — Assessment & Plan Note (Addendum)
No sign of volume overload today. .  She does need to continue the spironolactone she says she doing well and recent BMP was normal.

## 2022-06-03 NOTE — Addendum Note (Signed)
Addended by: Nani Gasser D on: 06/03/2022 04:57 PM   Modules accepted: Orders

## 2022-06-03 NOTE — Patient Instructions (Signed)
Please consider getting your Prevnar 20 vaccine to update your pneumonia vaccines.

## 2022-06-03 NOTE — Assessment & Plan Note (Signed)
Doing well with her Advair.

## 2022-06-03 NOTE — Assessment & Plan Note (Signed)
Well controlled. Continue current regimen. Follow up in  6 mo  

## 2022-06-03 NOTE — Assessment & Plan Note (Signed)
On Eliquis but it is quite costly we might be able to see if she might qualify for some patient assistance through the drug manufacture

## 2022-06-03 NOTE — Assessment & Plan Note (Signed)
Weight down from December. Continue to stay active.

## 2022-07-10 IMAGING — CT CT HEART MORP W/ CTA COR W/ SCORE W/ CA W/CM &/OR W/O CM
3 of 8 series · 7 of 20 positions shown, 8 images · IV contrast (APPLIED)
Comparison: 09/17/2016 chest radiograph
COMPARISON: 09/17/2016 chest radiograph

Addendum:
EXAM:
OVER-READ INTERPRETATION  CT CHEST

The following report is an over-read performed by radiologist Dr.
Zam Isaacs [REDACTED] on 02/01/2021. This over-read
does not include interpretation of cardiac or coronary anatomy or
pathology. The coronary CTA interpretation by the cardiologist is
attached.
CLINICAL DATA: Chest pain
Cardiac/Coronary CTA
TECHNIQUE: A non-contrast, gated CT scan was obtained with axial slices of 3 mm
through the heart for calcium scoring. Calcium scoring was performed
using the Agatston method. A 120 kV prospective, gated, contrast
cardiac scan was obtained. Gantry rotation speed was 250 msecs and
collimation was 0.6 mm. Two sublingual nitroglycerin tablets (0.8
mg) were given. The 3D data set was reconstructed in 5% intervals of
the 35-75% of the R-R cycle. Diastolic phases were analyzed on a
dedicated workstation using MPR, MIP, and VRT modes. The patient
received 95 cc of contrast.

[Series 7: best syst · axial · 0.39mm/px · z∈[+1200,+1355]mm · 3 of 387 slices shown, 4 images]
[im 1/387  vessel]
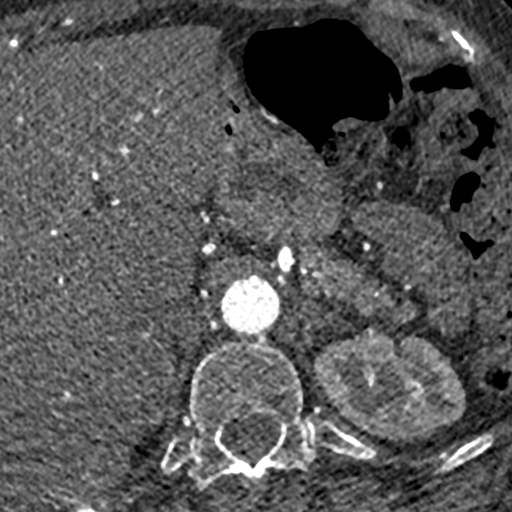
[im 1/387  lung]
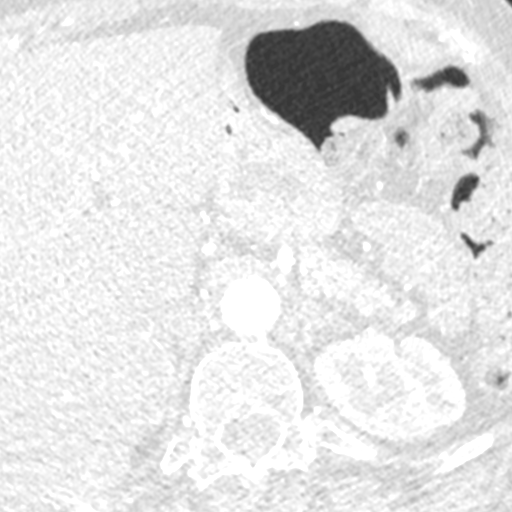
[im 194/387  vessel]
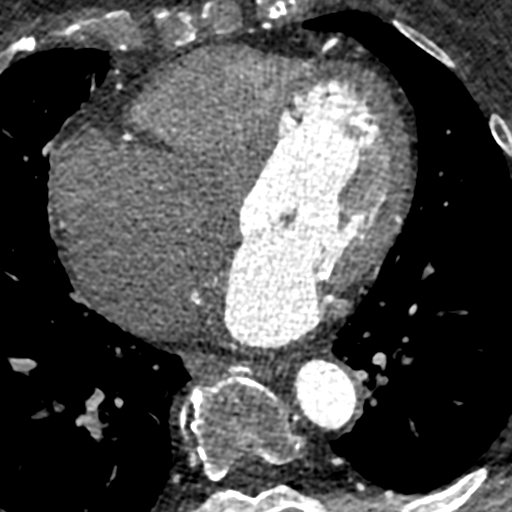
[im 387/387  vessel]
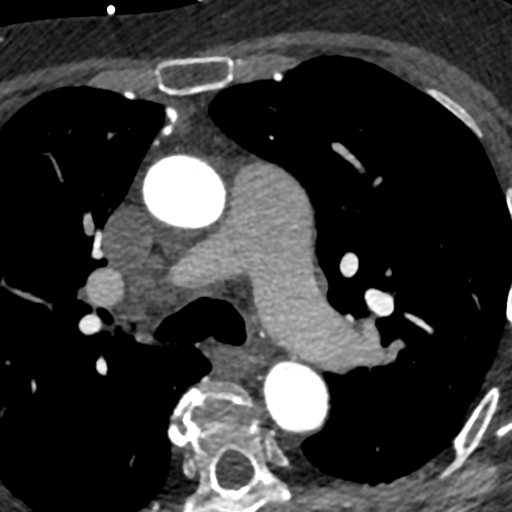

[Series 14: ts diast 69 % · axial · 0.39mm/px · z∈[+1252,+1303]mm · 2 of 387 slices shown]
[im 129/387  vessel]
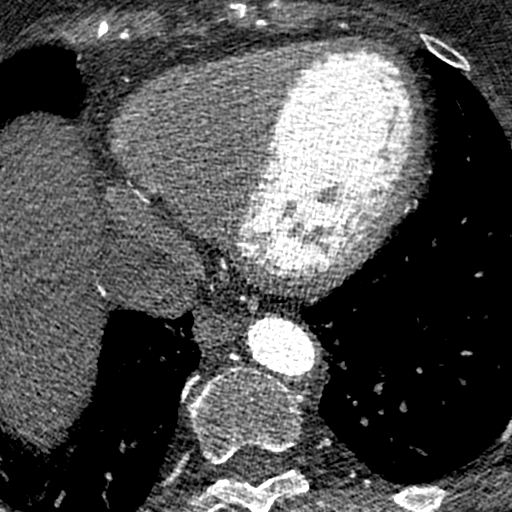
[im 258/387  vessel]
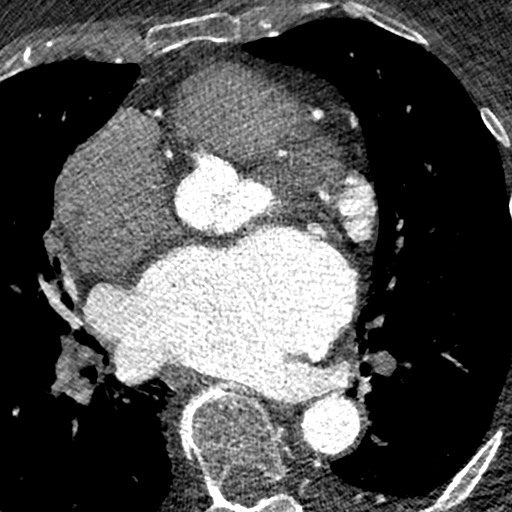

[Series 15: ts syst 16 % · axial · 0.39mm/px · z∈[+1252,+1303]mm · 2 of 387 slices shown]
[im 129/387  vessel]
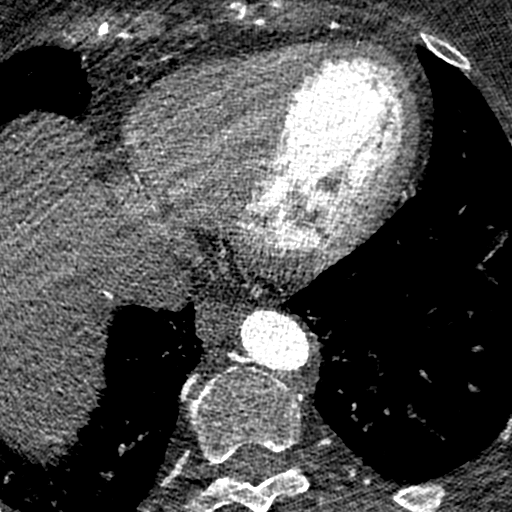
[im 258/387  vessel]
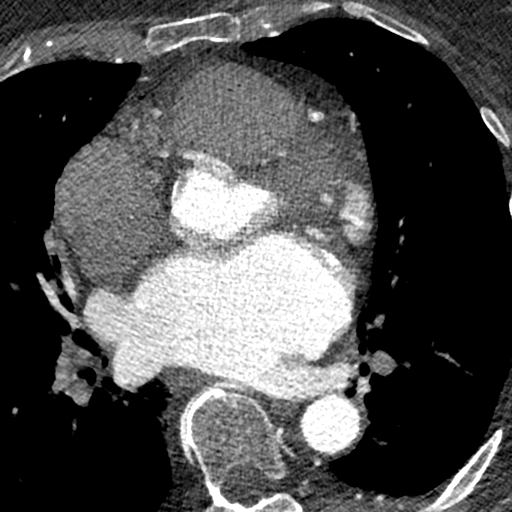

[7 of 20 positions shown; findings below may reference images not displayed]

FINDINGS: Vascular: Aortic atherosclerosis. Tortuous thoracic aorta. Pulmonary
artery enlargement, outflow tract 3.4 cm. No central pulmonary
embolism, on this non-dedicated study.

Mediastinum/Nodes: No imaged thoracic adenopathy.

Lungs/Pleura: No pleural fluid. Right upper lobe calcified granuloma
on [DATE].

Upper Abdomen: Irregular hepatic capsule with caudate lobe
enlargement. Normal imaged portions of the spleen, stomach,
pancreas, adrenal glands, kidneys.

Musculoskeletal: No acute osseous abnormality.
IMPRESSION: 1.  No acute findings in the imaged extracardiac chest.
2. Pulmonary artery enlargement suggests pulmonary arterial
hypertension.
3.  Aortic Atherosclerosis (EXBK9-VNQ.Q).
4. Findings suspicious for cirrhosis. Correlate with risk factors.
Consider dedicated abdominal imaging with ultrasound elastography or
MRI.
FINDINGS: Image quality:Poor

Noise artifact is: Severe

Cardiac Motion artifact is significant

Coronary Arteries:  Normal coronary origin.  Right dominance.

Left main: The left main is a large caliber vessel with a normal
take off from the left coronary cusp that trifurcates into a LAD,
LCX, and ramus intermedius. There is no plaque or stenosis.

Left anterior descending artery: The LAD is patent without evidence
of calcified plaque. The LAD gives off 2 patent diagonal branches.
Cannot rule out mild non calcified plaque in the mid LAD just after
the takeoff of the first diagonal but also could be related to
cardiac motion and noise artifact.

Ramus intermedius: Patent with no evidence of plaque in the proximal
vessel but the mid and distal vessel were not adequately visualized.

Left circumflex artery: The LCX is non-dominant. It is poorly
visualized due to significant cardiac motion and noise artifact.
There was no coronary Ca noted on calcium score imaging.

Right coronary artery: The RCA is dominant with normal take off from
the right coronary cusp. There is no evidence of calcified plaque or
stenosis in the proximal or mid portion of the vessel. Significantly
limited for detection of non calcified plaque due to cardiac motion
and noise artifact. The distal RCA and PDA are not visualized.

Right Atrium: Right atrial size is within normal limits.

Right Ventricle: The right ventricular cavity is within normal
limits.

Left Atrium: Left atrial size is normal in size with no left atrial
appendage filling defect.

Left Ventricle: The ventricular cavity size is within normal limits.
There are no stigmata of prior infarction. There is no abnormal
filling defect.

Pulmonary arteries: Normal in size without proximal filling defect.

Pulmonary veins: Normal pulmonary venous drainage.

Pericardium: Normal thickness with no significant effusion or
calcium present.

Cardiac valves: The aortic valve is trileaflet without significant
calcification. The mitral valve is normal structure with mitral
annular calcification.

Aorta: Normal caliber with ascending aortic atherosclerosis.

Extra-cardiac findings: See attached radiology report for
non-cardiac structures.
IMPRESSION: 1. Poor quality study due to significant noise and cardiac motion
artifact.

2. Coronary calcium score of 0. This was 0 percentile for age-, sex,
and race-matched controls.

2.  Normal coronary origin with right dominance.

3. Very limited study due to significant noise and cardiac motion
artifact. The distal RCA, PDA and mid and distal LCx and Ramus were
not visualized. Cannot rule out non calcified plaque in the mid LAD
after the takeoff of the D1.

4. Recommend other non invasive imaging modality to assess for
ischemia.

RECOMMENDATIONS:
1. CAD-RADS 0: No evidence of CAD (0%). Consider non-atherosclerotic
causes of chest pain.

2. CAD-RADS 1: Minimal non-obstructive CAD (0-24%). Consider
non-atherosclerotic causes of chest pain. Consider preventive
therapy and risk factor modification.

3. CAD-RADS 2: Mild non-obstructive CAD (25-49%). Consider
non-atherosclerotic causes of chest pain. Consider preventive
therapy and risk factor modification.

4. CAD-RADS 3: Moderate stenosis. Consider symptom-guided
anti-ischemic pharmacotherapy as well as risk factor modification
per guideline directed care. Additional analysis with CT FFR will be
submitted.

5. CAD-RADS 4: Severe stenosis. (70-99% or > 50% left main). Cardiac
catheterization or CT FFR is recommended. Consider symptom-guided
anti-ischemic pharmacotherapy as well as risk factor modification
per guideline directed care. Invasive coronary angiography
recommended with revascularization per published guideline
statements.

6. CAD-RADS 5: Total coronary occlusion (100%). Consider cardiac
catheterization or viability assessment. Consider symptom-guided
anti-ischemic pharmacotherapy as well as risk factor modification
per guideline directed care.

7. CAD-RADS N: Non-diagnostic study. Obstructive CAD can't be
excluded. Alternative evaluation is recommended.

*** End of Addendum ***
EXAM:
OVER-READ INTERPRETATION  CT CHEST

The following report is an over-read performed by radiologist Dr.
Zam Isaacs [REDACTED] on 02/01/2021. This over-read
does not include interpretation of cardiac or coronary anatomy or
pathology. The coronary CTA interpretation by the cardiologist is
attached.
FINDINGS: Vascular: Aortic atherosclerosis. Tortuous thoracic aorta. Pulmonary
artery enlargement, outflow tract 3.4 cm. No central pulmonary
embolism, on this non-dedicated study.

Mediastinum/Nodes: No imaged thoracic adenopathy.

Lungs/Pleura: No pleural fluid. Right upper lobe calcified granuloma
on [DATE].

Upper Abdomen: Irregular hepatic capsule with caudate lobe
enlargement. Normal imaged portions of the spleen, stomach,
pancreas, adrenal glands, kidneys.

Musculoskeletal: No acute osseous abnormality.
IMPRESSION: 1.  No acute findings in the imaged extracardiac chest.
2. Pulmonary artery enlargement suggests pulmonary arterial
hypertension.
3.  Aortic Atherosclerosis (EXBK9-VNQ.Q).
4. Findings suspicious for cirrhosis. Correlate with risk factors.
Consider dedicated abdominal imaging with ultrasound elastography or
MRI.

## 2022-09-12 NOTE — Progress Notes (Deleted)
HPI: FU CHF. Patient previously had congestive heart failure in [place]. She was told her heart was functioning at "30%". She was treated with medications with improvement. Apparently stress test normal but no records available. Echocardiogram January 2017 showed normal LV systolic function, mild left ventricular hypertrophy and biatrial enlargement. Previously seen at [facility] and reduced LV function felt possibly related to atrial tachycardia. Echo 11/22 showed EF 35-40, mild RAE, moderate MR, mild to moderate TR. Nuclear study 12/22 showed EF 38 and no ischemia.  Monitor December 2022 showed sinus rhythm with occasional PAC, short burst of PAT, occasional PVC and rare couplet.  Coronary CTA January 2023 showed calcium score 0; study limited in distal right coronary artery, PDA and mid distal circumflex/ramus were not visualized.  Interpreted as could not rule out calcified plaque in the mid LAD.  Note there was pulmonary artery enlargement suggestive of pulmonary hypertension and findings suspicious for cirrhosis.  Echocardiogram September 2023 showed ejection fraction 45 to 50%, biatrial enlargement, moderate mitral regurgitation, moderate tricuspid regurgitation.  Cardioversion of atrial fibrillation had been planned at last office visit in December.  However patient then decided to continue with rate control and anticoagulation.  Since last seen,   Current Outpatient Medications  Medication Sig Dispense Refill   albuterol (PROAIR HFA) 108 (90 Base) MCG/ACT inhaler Inhale 2 puffs into the lungs every 4 (four) hours as needed for wheezing or shortness of breath. 6.7 g 5   albuterol (PROVENTIL) (2.5 MG/3ML) 0.083% nebulizer solution Take 3 mLs (2.5 mg total) by nebulization every 4 (four) hours as needed for wheezing or shortness of breath. Dx:J45.41 75 mL 6   apixaban (ELIQUIS) 5 MG TABS tablet Take 1 tablet (5 mg total) by mouth 2 (two) times daily. 60 tablet 6   carvedilol (COREG) 12.5  MG tablet Take 1 tablet (12.5 mg total) by mouth 2 (two) times daily with a meal. 180 tablet 3   cefdinir (OMNICEF) 300 MG capsule Take 1 capsule (300 mg total) by mouth 2 (two) times daily. 14 capsule 0   cetirizine (ZYRTEC) 10 MG tablet Take 1 tablet (10 mg total) by mouth daily as needed for allergies or rhinitis. 30 tablet 5   Cholecalciferol (QC VITAMIN D3) 50 MCG (2000 UT) TABS Take 1 tablet by mouth daily.     digoxin (LANOXIN) 0.125 MG tablet Take 1 tablet (0.125 mg total) by mouth daily. 90 tablet 3   ferrous sulfate 324 MG TBEC Take 324 mg by mouth.     fluticasone-salmeterol (ADVAIR) 250-50 MCG/ACT AEPB INHALE 1 DOSE BY MOUTH INTO THE LUNGS TWICE DAILY 180 each 1   furosemide (LASIX) 20 MG tablet Take 1 tablet (20 mg total) by mouth daily. 90 tablet 3   Omega-3 Fatty Acids (FISH OIL) 1000 MG CAPS Take 1 capsule by mouth daily.     Prenatal Vit-Fe Fumarate-FA (PRENATAL MULTIVITAMIN) TABS tablet Take 1 tablet by mouth daily at 12 noon.     sacubitril-valsartan (ENTRESTO) 24-26 MG Take 1 tablet by mouth 2 (two) times daily. 60 tablet 6   spironolactone (ALDACTONE) 25 MG tablet Take 1 tablet (25 mg total) by mouth daily. 90 tablet 3   No current facility-administered medications for this visit.     Past Medical History:  Diagnosis Date   Abnormal echocardiogram 09/18/2016   Mild LVH, EF 55-65%, moderately dilated LA and RA   Allergy    Asthma    Asthma    Atrial tachycardia  Cardiomyopathy (HCC)    CHF (congestive heart failure) (HCC)    Depression    Hyperlipidemia    Hypertension    Obesity     Past Surgical History:  Procedure Laterality Date   LEFT OOPHORECTOMY  1967   TUBAL LIGATION  1967    Social History   Socioeconomic History   Marital status: Widowed    Spouse name: Not on file   Number of children: 2   Years of education: 14   Highest education level: Some college, no degree  Occupational History   Occupation: Retired.   Tobacco Use   Smoking  status: Never   Smokeless tobacco: Never  Vaping Use   Vaping status: Never Used  Substance and Sexual Activity   Alcohol use: No   Drug use: No   Sexual activity: Not on file  Other Topics Concern   Not on file  Social History Narrative   Lives alone. She had two children, one has deceased. She enjoys writing and doing puzzles. She did get a new puppy recently.   Social Determinants of Health   Financial Resource Strain: Low Risk  (09/30/2021)   Overall Financial Resource Strain (CARDIA)    Difficulty of Paying Living Expenses: Not hard at all  Food Insecurity: No Food Insecurity (09/30/2021)   Hunger Vital Sign    Worried About Running Out of Food in the Last Year: Never true    Ran Out of Food in the Last Year: Never true  Transportation Needs: No Transportation Needs (09/30/2021)   PRAPARE - Administrator, Civil Service (Medical): No    Lack of Transportation (Non-Medical): No  Physical Activity: Inactive (09/30/2021)   Exercise Vital Sign    Days of Exercise per Week: 0 days    Minutes of Exercise per Session: 0 min  Stress: No Stress Concern Present (09/30/2021)   [employer] of Occupational Health - Occupational Stress Questionnaire    Feeling of Stress : Not at all  Social Connections: Socially Isolated (09/30/2021)   Social Connection and Isolation Panel [NHANES]    Frequency of Communication with Friends and Family: More than three times a week    Frequency of Social Gatherings with Friends and Family: Once a week    Attends Religious Services: Never    Database administrator or Organizations: No    Attends Banker Meetings: Never    Marital Status: Widowed  Intimate Partner Violence: Not At Risk (09/30/2021)   Humiliation, Afraid, Rape, and Kick questionnaire    Fear of Current or Ex-Partner: No    Emotionally Abused: No    Physically Abused: No    Sexually Abused: No    Family History  Problem Relation Age of Onset   Hypertension  Mother    Alzheimer's disease Mother    Cancer Father    Pancreatic cancer Father    Hypertension Father    Other Sister        brain tumor   Diabetes Brother    Non-Hodgkin's lymphoma Brother    Leukemia Brother    Cancer Daughter 33       Brain tumor    ROS: no fevers or chills, productive cough, hemoptysis, dysphasia, odynophagia, melena, hematochezia, dysuria, hematuria, rash, seizure activity, orthopnea, PND, pedal edema, claudication. Remaining systems are negative.  Physical Exam: Well-developed well-nourished in no acute distress.  Skin is warm and dry.  HEENT is normal.  Neck is supple.  Chest is clear to auscultation  with normal expansion.  Cardiovascular exam is regular rate and rhythm.  Abdominal exam nontender or distended. No masses palpated. Extremities show no edema. neuro grossly intact  ECG- personally reviewed  A/P  1 permanent atrial fibrillation-continue digoxin and carvedilol for rate control as well as apixaban.  She previously declined cardioversion and she understands that the longer she is in this rhythm less likely we are to ever reestablish sinus rhythm.  She prefers rate control and anticoagulation.  2 history of nonischemic cardiomyopathy-continue carvedilol and Entresto.  Previous calcium score 0 and nuclear study showed no ischemia.  Therefore do not think this is ischemia mediated.  Heart rate is controlled as well.  3 hyperlipidemia-continue statin.  4 hypertension-blood pressure controlled.  Continue present medical regimen.  5 chronic systolic congestive heart failure-continue Lasix and spironolactone.  We discussed SGLT2 inhibitor but she declines due to expense.  6 atrial tachycardia-continue beta-blocker.  She denies any recent episodes.  7 obstructive sleep apnea-  [name], MD

## 2022-09-22 ENCOUNTER — Ambulatory Visit: Payer: Medicare Other | Admitting: Cardiology

## 2022-10-06 ENCOUNTER — Telehealth: Payer: Self-pay

## 2022-10-06 ENCOUNTER — Ambulatory Visit (INDEPENDENT_AMBULATORY_CARE_PROVIDER_SITE_OTHER): Payer: Medicare Other | Admitting: Family Medicine

## 2022-10-06 DIAGNOSIS — Z0000 Encounter for general adult medical examination without abnormal findings: Secondary | ICD-10-CM | POA: Diagnosis not present

## 2022-10-06 NOTE — Progress Notes (Signed)
Care Guide Note  10/06/2022 Name: CHARNESHA DIRUSSO MRN: [id number] DOB: [birth year]  Referred by: Agapito Games, MD Reason for referral : Care Coordination (Outreach to schedule with Pharm d )   Briana Silva is a 77 y.o. year old female who is a primary care patient of Linford Arnold, Barbarann Ehlers, MD. Germaine Pomfret was referred to the pharmacist for assistance related to HTN and HLD.    Successful contact was made with the patient to discuss pharmacy services including being ready for the pharmacist to call at least 5 minutes before the scheduled appointment time, to have medication bottles and any blood sugar or blood pressure readings ready for review. The patient agreed to meet with the pharmacist via with the pharmacist via telephone visit on (date/time).  10/07/2022  Penne Lash, RMA Care Guide [facility]  [place], [place] [address] Direct Dial: [phone] [email or web address]

## 2022-10-06 NOTE — Patient Instructions (Addendum)
MEDICARE ANNUAL WELLNESS VISIT Health Maintenance Summary and Written Plan of Care  Briana Silva ,  Thank you for allowing me to perform your Medicare Annual Wellness Visit and for your ongoing commitment to your health.   Health Maintenance & Immunization History Health Maintenance  Topic Date Due   COVID-19 Vaccine (6 - 2023-24 season) 10/22/2022 (Originally 09/14/2022)   INFLUENZA VACCINE  04/13/2023 (Originally 08/14/2022)   Zoster Vaccines- Shingrix (1 of 2) 06/13/2023 (Originally 07/04/1964)   DTaP/Tdap/Td (2 - Td or Tdap) 09/09/2023   Medicare Annual Wellness (AWV)  10/06/2023   DEXA SCAN  11/29/2023   Pneumonia Vaccine 3+ Years old  Completed   Hepatitis C Screening  Completed   HPV VACCINES  Aged Out   Colonoscopy  Discontinued   Immunization History  Administered Date(s) Administered   Fluad Quad(high Dose 65+) 12/03/2019, 12/20/2020, 10/02/2021   Influenza Split 10/18/2010, 09/26/2011   Influenza, High Dose Seasonal PF 09/25/2016, 12/01/2017   Influenza,inj,Quad PF,6+ Mos 01/10/2013, 09/08/2013, 09/22/2014, 09/27/2015   Moderna Sars-Covid-2 Vaccination 02/24/2019, 03/24/2019, 11/17/2019, 12/03/2019   Pfizer(Comirnaty)Fall Seasonal Vaccine 12 years and older 12/03/2021   Pneumococcal Conjugate-13 06/22/2014   Pneumococcal Polysaccharide-23 01/13/2009   Tdap 09/08/2013    These are the patient goals that we discussed:  Goals Addressed               This Visit's Progress     Patient Stated (pt-stated)        Patient stated that she would like to be able to work out.          This is a list of Health Maintenance Items that are overdue or due now: Influenza vaccine Shingles vaccine  Orders/Referrals Placed Today: Orders Placed This Encounter  Procedures   AMB Referral to Community Care Coordinaton ([facility] Patients)    Referral Priority:   Routine    Referral Type:   Consultation    Referral Reason:   Care Coordination    Number of Visits Requested:   1    (Contact our referral department at [phone] if you have not spoken with someone about your referral appointment within the next 5 days)    Follow-up Plan Follow-up with Agapito Games, MD as planned Schedule shingles vaccine at the pharmacy.  Influenza vaccine at the pharmacy or in office. Medicare wellness visit in one year.  AVS printed and mailed to the patient.       Health Maintenance, Female Adopting a healthy lifestyle and getting preventive care are important in promoting health and wellness. Ask your health care provider about: The right schedule for you to have regular tests and exams. Things you can do on your own to prevent diseases and keep yourself healthy. What should I know about diet, weight, and exercise? Eat a healthy diet  Eat a diet that includes plenty of vegetables, fruits, low-fat dairy products, and lean protein. Do not eat a lot of foods that are high in solid fats, added sugars, or sodium. Maintain a healthy weight Body mass index (BMI) is used to identify weight problems. It estimates body fat based on height and weight. Your health care provider can help determine your BMI and help you achieve or maintain a healthy weight. Get regular exercise Get regular exercise. This is one of the most important things you can do for your health. Most adults should: Exercise for at least 150 minutes each week. The exercise should increase your heart rate and make you sweat (moderate-intensity exercise). Do strengthening exercises  at least twice a week. This is in addition to the moderate-intensity exercise. Spend less time sitting. Even light physical activity can be beneficial. Watch cholesterol and blood lipids Have your blood tested for lipids and cholesterol at 79 years of age, then have this test every 5 years. Have your cholesterol levels checked more often if: Your lipid or cholesterol levels are high. You are older than 79 years of age. You are  at high risk for heart disease. What should I know about cancer screening? Depending on your health history and family history, you may need to have cancer screening at various ages. This may include screening for: Breast cancer. Cervical cancer. Colorectal cancer. Skin cancer. Lung cancer. What should I know about heart disease, diabetes, and high blood pressure? Blood pressure and heart disease High blood pressure causes heart disease and increases the risk of stroke. This is more likely to develop in people who have high blood pressure readings or are overweight. Have your blood pressure checked: Every 3-5 years if you are 57-35 years of age. Every year if you are 85 years old or older. Diabetes Have regular diabetes screenings. This checks your fasting blood sugar level. Have the screening done: Once every three years after age 18 if you are at a normal weight and have a low risk for diabetes. More often and at a younger age if you are overweight or have a high risk for diabetes. What should I know about preventing infection? Hepatitis B If you have a higher risk for hepatitis B, you should be screened for this virus. Talk with your health care provider to find out if you are at risk for hepatitis B infection. Hepatitis C Testing is recommended for: Everyone born from [birth year] through 1967. Anyone with known risk factors for hepatitis C. Sexually transmitted infections (STIs) Get screened for STIs, including gonorrhea and chlamydia, if: You are sexually active and are younger than 79 years of age. You are older than 79 years of age and your health care provider tells you that you are at risk for this type of infection. Your sexual activity has changed since you were last screened, and you are at increased risk for chlamydia or gonorrhea. Ask your health care provider if you are at risk. Ask your health care provider about whether you are at high risk for HIV. Your health care provider  may recommend a prescription medicine to help prevent HIV infection. If you choose to take medicine to prevent HIV, you should first get tested for HIV. You should then be tested every 3 months for as long as you are taking the medicine. Pregnancy If you are about to stop having your period (premenopausal) and you may become pregnant, seek counseling before you get pregnant. Take 400 to 800 micrograms (mcg) of folic acid every day if you become pregnant. Ask for birth control (contraception) if you want to prevent pregnancy. Osteoporosis and menopause Osteoporosis is a disease in which the bones lose minerals and strength with aging. This can result in bone fractures. If you are 62 years old or older, or if you are at risk for osteoporosis and fractures, ask your health care provider if you should: Be screened for bone loss. Take a calcium or vitamin D supplement to lower your risk of fractures. Be given hormone replacement therapy (HRT) to treat symptoms of menopause. Follow these instructions at home: Alcohol use Do not drink alcohol if: Your health care provider tells you not to drink. You  are pregnant, may be pregnant, or are planning to become pregnant. If you drink alcohol: Limit how much you have to: 0-1 drink a day. Know how much alcohol is in your drink. In the [place] one drink equals one 12 oz bottle of beer (355 mL), one 5 oz glass of wine (148 mL), or one 1 oz glass of hard liquor (44 mL). Lifestyle Do not use any products that contain nicotine or tobacco. These products include cigarettes, chewing tobacco, and vaping devices, such as e-cigarettes. If you need help quitting, ask your health care provider. Do not use street drugs. Do not share needles. Ask your health care provider for help if you need support or information about quitting drugs. General instructions Schedule regular health, dental, and eye exams. Stay current with your vaccines. Tell your health care provider  if: You often feel depressed. You have ever been abused or do not feel safe at home. Summary Adopting a healthy lifestyle and getting preventive care are important in promoting health and wellness. Follow your health care provider's instructions about healthy diet, exercising, and getting tested or screened for diseases. Follow your health care provider's instructions on monitoring your cholesterol and blood pressure. This information is not intended to replace advice given to you by your health care provider. Make sure you discuss any questions you have with your health care provider. Document Revised: 05/21/2020 Document Reviewed: 05/21/2020 Elsevier Patient Education  2024 [employer].

## 2022-10-06 NOTE — Progress Notes (Signed)
MEDICARE ANNUAL WELLNESS VISIT  10/06/2022  Telephone Visit Disclaimer This Medicare AWV was conducted by telephone due to national recommendations for restrictions regarding the COVID-19 Pandemic (e.g. social distancing).  I verified, using two identifiers, that I am speaking with Briana Silva or their authorized healthcare agent. I discussed the limitations, risks, security, and privacy concerns of performing an evaluation and management service by telephone and the potential availability of an in-person appointment in the future. The patient expressed understanding and agreed to proceed.  Location of Patient: Home Location of Provider (nurse):  Provider home  Subjective:    Briana Silva is a 77 y.o. female patient of Metheney, Barbarann Ehlers, MD who had a Medicare Annual Wellness Visit today via telephone. Briana Silva is Retired and lives alone. she has one living child. she reports that she is socially active and does interact with friends/family regularly. she is minimally physically active and enjoys writing and ding puzzles.  Patient Care Team: Agapito Games, MD as PCP - General (Family Medicine) Jens Som Madolyn Frieze, MD as PCP - Cardiology (Cardiology) Gabriel Carina, [facility] as Pharmacist (Pharmacist)     10/06/2022   11:10 AM 09/30/2021   10:26 AM 09/28/2020    9:43 AM 06/20/2019    8:49 AM  Advanced Directives  Does Patient Have a Medical Advance Directive? Yes No No No  Type of Advance Directive Healthcare Power of Attorney     Does patient want to make changes to medical advance directive? No - Patient declined     Copy of Healthcare Power of Attorney in Chart? No - copy requested     Would patient like information on creating a medical advance directive?  No - Patient declined No - Patient declined No - Patient declined    Hospital Utilization Over the Past 12 Months: # of hospitalizations or ER visits: 0 # of surgeries: 0  Review of Systems    Patient reports  that her overall health is unchanged compared to last year.  History obtained from chart review and the patient  Patient Reported Readings (BP, Pulse, CBG, Weight, etc) none Per patient no change in vitals since last visit, unable to obtain new vitals due to telehealth visit  Pain Assessment Pain : No/denies pain     Current Medications & Allergies (verified) Allergies as of 10/06/2022       Reactions   Metoprolol Swelling   Other reaction(s): Other (See Comments) Severe fatigue, "tightness around my neck", severe leg weakness   Dust Mite Mixed Allergen Ext [mite (d. Farinae)]         Medication List        Accurate as of October 06, 2022 11:36 AM. If you have any questions, ask your nurse or doctor.          STOP taking these medications    cefdinir 300 MG capsule Commonly known as: OMNICEF       TAKE these medications    albuterol 108 (90 Base) MCG/ACT inhaler Commonly known as: ProAir HFA Inhale 2 puffs into the lungs every 4 (four) hours as needed for wheezing or shortness of breath.   albuterol (2.5 MG/3ML) 0.083% nebulizer solution Commonly known as: PROVENTIL Take 3 mLs (2.5 mg total) by nebulization every 4 (four) hours as needed for wheezing or shortness of breath. Dx:J45.41   apixaban 5 MG Tabs tablet Commonly known as: ELIQUIS Take 1 tablet (5 mg total) by mouth 2 (two) times daily.   carvedilol 12.5  MG tablet Commonly known as: COREG Take 1 tablet (12.5 mg total) by mouth 2 (two) times daily with a meal.   cetirizine 10 MG tablet Commonly known as: ZYRTEC Take 1 tablet (10 mg total) by mouth daily as needed for allergies or rhinitis.   digoxin 0.125 MG tablet Commonly known as: LANOXIN Take 1 tablet (0.125 mg total) by mouth daily.   ferrous sulfate 324 MG Tbec Take 324 mg by mouth.   Fish Oil 1000 MG Caps Take 1 capsule by mouth daily.   fluticasone-salmeterol 250-50 MCG/ACT Aepb Commonly known as: ADVAIR INHALE 1 DOSE BY  MOUTH INTO THE LUNGS TWICE DAILY   furosemide 20 MG tablet Commonly known as: LASIX Take 1 tablet (20 mg total) by mouth daily.   prenatal multivitamin Tabs tablet Take 1 tablet by mouth daily at 12 noon.   QC Vitamin D3 50 MCG (2000 UT) Tabs Generic drug: Cholecalciferol Take 1 tablet by mouth daily.   sacubitril-valsartan 24-26 MG Commonly known as: ENTRESTO Take 1 tablet by mouth 2 (two) times daily.   spironolactone 25 MG tablet Commonly known as: ALDACTONE Take 1 tablet (25 mg total) by mouth daily.        History (reviewed): Past Medical History:  Diagnosis Date   Abnormal echocardiogram 09/18/2016   Mild LVH, EF 55-65%, moderately dilated LA and RA   Allergy    Asthma    Asthma    Atrial tachycardia    Cardiomyopathy (HCC)    CHF (congestive heart failure) (HCC)    Depression    Hyperlipidemia    Hypertension    Obesity    Past Surgical History:  Procedure Laterality Date   LEFT OOPHORECTOMY  1967   TUBAL LIGATION  1967   Family History  Problem Relation Age of Onset   Hypertension Mother    Alzheimer's disease Mother    Cancer Father    Pancreatic cancer Father    Hypertension Father    Other Sister        brain tumor   Diabetes Brother    Non-Hodgkin's lymphoma Brother    Leukemia Brother    Cancer Daughter 8       Brain tumor   Social History   Socioeconomic History   Marital status: Widowed    Spouse name: Not on file   Number of children: 2   Years of education: 14   Highest education level: Some college, no degree  Occupational History   Occupation: Retired.   Tobacco Use   Smoking status: Never   Smokeless tobacco: Never  Vaping Use   Vaping status: Never Used  Substance and Sexual Activity   Alcohol use: No   Drug use: No   Sexual activity: Not on file  Other Topics Concern   Not on file  Social History Narrative   Lives alone. She had two children, one has deceased. She enjoys writing and doing puzzles. She did get a  new puppy recently.   Social Determinants of Health   Financial Resource Strain: Low Risk  (10/06/2022)   Overall Financial Resource Strain (CARDIA)    Difficulty of Paying Living Expenses: Not very hard  Food Insecurity: No Food Insecurity (10/06/2022)   Hunger Vital Sign    Worried About Running Out of Food in the Last Year: Never true    Ran Out of Food in the Last Year: Never true  Transportation Needs: No Transportation Needs (10/06/2022)   PRAPARE - Transportation    Lack of  Transportation (Medical): No    Lack of Transportation (Non-Medical): No  Physical Activity: Inactive (10/06/2022)   Exercise Vital Sign    Days of Exercise per Week: 0 days    Minutes of Exercise per Session: 0 min  Stress: No Stress Concern Present (10/06/2022)   [employer] of Occupational Health - Occupational Stress Questionnaire    Feeling of Stress : Not at all  Social Connections: Socially Isolated (10/06/2022)   Social Connection and Isolation Panel [NHANES]    Frequency of Communication with Friends and Family: Twice a week    Frequency of Social Gatherings with Friends and Family: Never    Attends Religious Services: Never    Database administrator or Organizations: No    Attends Banker Meetings: Never    Marital Status: Widowed    Activities of Daily Living    10/06/2022   11:18 AM  In your present state of health, do you have any difficulty performing the following activities:  Hearing? 1  Comment left ear impairment  Vision? 0  Difficulty concentrating or making decisions? 1  Comment sometimes  Walking or climbing stairs? 1  Comment some balance problems; uses a cane  Dressing or bathing? 0  Doing errands, shopping? 0  Preparing Food and eating ? N  Using the Toilet? N  In the past six months, have you accidently leaked urine? N  Do you have problems with loss of bowel control? N  Managing your Medications? N  Managing your Finances? N  Housekeeping or managing  your Housekeeping? N    Patient Education/ Literacy How often do you need to have someone help you when you read instructions, pamphlets, or other written materials from your doctor or pharmacy?: 1 - Never What is the last grade level you completed in school?: two years of college  Exercise    Diet Patient reports consuming 2 meals a day and 0-1 snack(s) a day Patient reports that her primary diet is: Regular Patient reports that she does have regular access to food.   Depression Screen    10/06/2022   11:11 AM 06/03/2022   11:27 AM 12/03/2021   10:04 AM 09/30/2021   10:28 AM 02/28/2021    2:11 PM 09/28/2020    9:48 AM 03/27/2016    1:24 PM  PHQ 2/9 Scores  PHQ - 2 Score 1 1 0 1 0 0 1     Fall Risk    10/06/2022   11:11 AM 06/03/2022   11:26 AM 12/03/2021   10:04 AM 09/30/2021   10:28 AM 02/28/2021    2:11 PM  Fall Risk   Falls in the past year? 0 0 0 0 0  Number falls in past yr: 0 0 0 0 0  Injury with Fall? 0 0 0 0 0  Risk for fall due to : No Fall Risks No Fall Risks No Fall Risks No Fall Risks No Fall Risks  Follow up Falls evaluation completed Falls evaluation completed Falls evaluation completed Falls evaluation completed Falls evaluation completed;Falls prevention discussed     Objective:  TAHEERAH NARASIMHAN seemed alert and oriented and she participated appropriately during our telephone visit.  Blood Pressure Weight BMI  BP Readings from Last 3 Encounters:  06/03/22 132/68  04/02/22 (!) 142/88  01/01/22 (!) 138/91   Wt Readings from Last 3 Encounters:  06/03/22 242 lb (109.8 kg)  04/02/22 233 lb 1.9 oz (105.7 kg)  01/01/22 245 lb 6.4 oz (111.3 kg)  BMI Readings from Last 1 Encounters:  06/03/22 39.06 kg/m    *Unable to obtain current vital signs, weight, and BMI due to telephone visit type  Hearing/Vision  Larosa did not seem to have difficulty with hearing/understanding during the telephone conversation Reports that she has had a formal eye exam by an  eye care professional within the past year Reports that she has not had a formal hearing evaluation within the past year *Unable to fully assess hearing and vision during telephone visit type  Cognitive Function:    10/06/2022   11:19 AM 09/30/2021   10:40 AM 09/28/2020    9:56 AM 06/20/2019    8:47 AM  6CIT Screen  What Year? 0 points 0 points 0 points 0 points  What month? 0 points 0 points 0 points 0 points  What time? 0 points 0 points 0 points 0 points  Count back from 20 0 points 0 points 0 points 0 points  Months in reverse 0 points 0 points 0 points 0 points  Repeat phrase 0 points 0 points 0 points 4 points  Total Score 0 points 0 points 0 points 4 points   (Normal:0-7, Significant for Dysfunction: >8)  Normal Cognitive Function Screening: Yes   Immunization & Health Maintenance Record Immunization History  Administered Date(s) Administered   Fluad Quad(high Dose 65+) 12/03/2019, 12/20/2020, 10/02/2021   Influenza Split 10/18/2010, 09/26/2011   Influenza, High Dose Seasonal PF 09/25/2016, 12/01/2017   Influenza,inj,Quad PF,6+ Mos 01/10/2013, 09/08/2013, 09/22/2014, 09/27/2015   Moderna Sars-Covid-2 Vaccination 02/24/2019, 03/24/2019, 11/17/2019, 12/03/2019   Pfizer(Comirnaty)Fall Seasonal Vaccine 12 years and older 12/03/2021   Pneumococcal Conjugate-13 06/22/2014   Pneumococcal Polysaccharide-23 01/13/2009   Tdap 09/08/2013    Health Maintenance  Topic Date Due   COVID-19 Vaccine (6 - 2023-24 season) 10/22/2022 (Originally 09/14/2022)   INFLUENZA VACCINE  04/13/2023 (Originally 08/14/2022)   Zoster Vaccines- Shingrix (1 of 2) 06/13/2023 (Originally 07/04/1962)   DTaP/Tdap/Td (2 - Td or Tdap) 09/09/2023   Medicare Annual Wellness (AWV)  10/06/2023   DEXA SCAN  11/29/2023   Pneumonia Vaccine 48+ Years old  Completed   Hepatitis C Screening  Completed   HPV VACCINES  Aged Out   Colonoscopy  Discontinued       Assessment  This is a routine wellness examination for  [employer].  Health Maintenance: Due or Overdue There are no preventive care reminders to display for this patient.   Briana Silva does not need a referral for Community Assistance: Care Management:   no Social Work:    no Prescription Assistance:  no Nutrition/Diabetes Education:  no   Plan:  Personalized Goals  Goals Addressed               This Visit's Progress     Patient Stated (pt-stated)        Patient stated that she would like to be able to work out.        Personalized Health Maintenance & Screening Recommendations  Influenza vaccine Shingles vaccine  Lung Cancer Screening Recommended: no (Low Dose CT Chest recommended if Age 58-80 years, 20 pack-year currently smoking OR have quit w/in past 15 years) Hepatitis C Screening recommended: no HIV Screening recommended: no  Advanced Directives: Written information was not prepared per patient's request.  Referrals & Orders Orders Placed This Encounter  Procedures   AMB Referral to Community Care Coordinaton ([facility] Patients)    Follow-up Plan Follow-up with Agapito Games, MD as planned Schedule shingles vaccine  at the pharmacy.  Influenza vaccine at the pharmacy or in office. Medicare wellness visit in one year.  AVS printed and mailed to the patient.    I have personally reviewed and noted the following in the patient's chart:   Medical and social history Use of alcohol, tobacco or illicit drugs  Current medications and supplements Functional ability and status Nutritional status Physical activity Advanced directives List of other physicians Hospitalizations, surgeries, and ER visits in previous 12 months Vitals Screenings to include cognitive, depression, and falls Referrals and appointments  In addition, I have reviewed and discussed with Briana Silva certain preventive protocols, quality metrics, and best practice recommendations. A written personalized care plan for  preventive services as well as general preventive health recommendations is available and can be mailed to the patient at her request.      Modesto Charon  10/06/2022

## 2022-10-07 ENCOUNTER — Ambulatory Visit: Payer: Medicare Other | Admitting: Family Medicine

## 2022-10-07 ENCOUNTER — Other Ambulatory Visit: Payer: Medicare Other

## 2022-10-07 NOTE — Progress Notes (Signed)
10/07/2022  Patient ID: Briana Silva, female   DOB: 23-Jun-1945, 77 y.o.   MRN: [id number]  S/O Telephone visit to assist with affordability of medications in response to referral from patient's PCP, Dr. Linford Arnold  Medication Access/Adherence -Patient has not been able to pick up Eliquis 5mg  BID or Entresto 24-26mg  BID due to cost (likely in Medicare coverage gap) -She also states she has not filled Advair 250-50 recently due to cost; and she is having to use nebulizer solution more frequently for SOB/wheezing -Patient is currently out of Eliquis and Entresto and is running low on nebulizer solution  A/P  Medication Access/Adherence -Based on [facility], patient does not qualify for LIS through Medicare Extra Help; but she would qualify for Novartis PAP for Entresto.  She would also qualify for BMS PAP for Eliquis based on [facility], but she is unsure of OOP spending requirement.   -Patient is going to contact [facility] for OOP spending report to submit along with Eliquis PAP application -Coordinating with medication assistance team to initiate PAP applications for Eliquis and Entresto -Contacting cardiologist, Dr. Jens Som, to see if office has any Eliquis or Entresto samples available in the meantime  -Contacting insurance plan to see what Advair alternative is preferred on plan (Wixela or fluticasone-salmeterol) and see what her copay would be  Follow-up:  Telephone follow-up scheduled for 2pm tomorrow to discuss Advair and availability of samples  [name], PharmD, DPLA

## 2022-10-08 ENCOUNTER — Telehealth: Payer: Self-pay | Admitting: *Deleted

## 2022-10-08 ENCOUNTER — Other Ambulatory Visit: Payer: Medicare Other

## 2022-10-08 DIAGNOSIS — I4819 Other persistent atrial fibrillation: Secondary | ICD-10-CM

## 2022-10-08 DIAGNOSIS — J4531 Mild persistent asthma with (acute) exacerbation: Secondary | ICD-10-CM

## 2022-10-08 DIAGNOSIS — I5042 Chronic combined systolic (congestive) and diastolic (congestive) heart failure: Secondary | ICD-10-CM

## 2022-10-08 MED ORDER — SACUBITRIL-VALSARTAN 24-26 MG PO TABS
1.0000 | ORAL_TABLET | Freq: Two times a day (BID) | ORAL | 0 refills | Status: DC
Start: 2022-10-08 — End: 2023-04-06

## 2022-10-08 MED ORDER — APIXABAN 5 MG PO TABS
5.0000 mg | ORAL_TABLET | Freq: Two times a day (BID) | ORAL | 0 refills | Status: DC
Start: 2022-10-08 — End: 2023-04-09

## 2022-10-08 NOTE — Progress Notes (Signed)
10/08/2022  Patient ID: Briana Silva, female   DOB: 30-Jun-1945, 77 y.o.   MRN: [id number]  Reviewed patient's Medicare formulary prior to follow-up telephone call this afternoon.  All long-acting maintenance inhalers for asthma are Tier 3 on the plan, so copays would be the same for all.  Patient would likely qualify for Trelegy PAP if PCP/patient agree with therapy change.  Consulting Dr. Linford Arnold prior to our telephone follow-up later today.  [name], PharmD, DPLA

## 2022-10-08 NOTE — Telephone Encounter (Signed)
-----   Message from [name] sent at 10/07/2022  5:44 PM EDT ----- Briana Silva afternoon- patient is out of Entresto 24/26mg  BID and Eliquis 5mg  BID.  We are working to assist with applying for PAP for both medications, but I wanted to see if there were samples she could get in the meantime?  Thank you!

## 2022-10-08 NOTE — Telephone Encounter (Signed)
Spoke with pt, aware eliquis and entrest samples placed at the front desk for pick up.

## 2022-10-08 NOTE — Progress Notes (Unsigned)
10/08/2022  Patient ID: Briana Silva, female   DOB: December 12, 1945, 77 y.o.   MRN: [id number]  Follow-up on medication access/adherence  -Dr. Ludwig Clarks office has samples of Eliquis and Entresto available for patient- made her aware these can be picked up at [facility] office and gave address -Medication assistance team contacted already to initiate PAP applications for Eliquis 5mg  BID and Entresto 24-26mg  BID -Dr. Linford Arnold does not prefer triple therapy with Trelegy at this time for asthma maintenance -Contacted patient's insurance plan; and Fluticasone-Salmeterol 250/50 copay for 100 day supply should be approximately $12.54/month with [facility] - 1 month supply at retail pharmacy would be $18.83 -Pending 1 month supply to go to [facility], so I can verify copay.  Will notify patient and discuss if she would prefer future refills from [facility] for discounted copay  [name], PharmD, DPLA

## 2022-10-09 MED ORDER — FLUTICASONE-SALMETEROL 250-50 MCG/ACT IN AEPB: INHALATION_SPRAY | RESPIRATORY_TRACT | 0 refills | Status: DC

## 2022-10-09 NOTE — Progress Notes (Signed)
10/09/2022  Patient ID: Briana Silva, female   DOB: Feb 21, 1945, 77 y.o.   MRN: [id number]  Contacted [facility] to check on patient price for generic Advair 250/50, and 1 month supply is going through for $11.56.  Contacted patient to make her aware; this is affordable to maintain, and she plans to pick prescription up when ready.    [name], PharmD, DPLA

## 2022-10-10 ENCOUNTER — Telehealth: Payer: Self-pay

## 2022-10-10 NOTE — Telephone Encounter (Signed)
PAP: PAP application for ELIQUIS, [employer] (BMS)) has been mailed to pt's home address on file. Will fax provider portion of application to provider's office when pt's portion is received.    PLEASE BE ADVISED!

## 2022-10-10 NOTE — Telephone Encounter (Signed)
-----   Message from [name] sent at 10/07/2022  5:46 PM EDT ----- Peri Jefferson afternoon!  Patient does not qualify for LIS based on [facility], but she would qualify for Novartis PAP for Entresto 24/46mg  BID.  She would also qualify for BMS PAP for Eliquis 5mg  BID based on [facility].  She is contacting her pharmacy for an OOP report to go along with this application.  Would you all mind to start the application process on her behalf, please?  Thank you!

## 2022-10-10 NOTE — Progress Notes (Signed)
Mailed PAP application FOR ELIQUIS To patient home with return envelope 03/04/2022. I will fax provider portion when I receive pt portion of app.      Yes I will mail to pt.  Thanks!  Georga Bora Rx Patient Advocate

## 2022-10-13 NOTE — Telephone Encounter (Signed)
PAP: PAP application for [employer], [employer]) has been mailed to [employer] home address on file. Will fax provider portion of application to provider's office when pt's portion is received.    PLEASE BE ADVISED

## 2022-10-22 ENCOUNTER — Telehealth: Payer: Self-pay

## 2022-10-22 NOTE — Progress Notes (Signed)
   10/22/2022  Patient ID: Briana Silva, female   DOB: 1945/01/23, 77 y.o.   MRN: [id number]  Patient outreach to check on progress of PAP applications for Eliquis and Entresto that were recently mailed to patient.  [name] states she has not recently checked her mail, so she is unsure if this have been received; but she will check it today.  I verified she was able obtain Advair (or generic) refill, and that she picked up Eliquis and Entresto samples from Dr. Ludwig Clarks office.  Provided my direct phone number, so she can contact me if PAP applications have not been received, or if she needs assistance completing these.  [name], PharmD, DPLA

## 2022-11-06 ENCOUNTER — Telehealth: Payer: Self-pay

## 2022-11-06 NOTE — Progress Notes (Signed)
   11/06/2022  Patient ID: Briana Silva, female   DOB: 07-29-1945, 77 y.o.   MRN: [id number]  Outreach attempt to see if patient had received PAP applications mailed on 9/27.  I was not able to reach the patient but did leave a HIPAA compliant voicemail with my direct phone number.  [name], PharmD, DPLA

## 2022-11-25 ENCOUNTER — Telehealth: Payer: Self-pay

## 2022-11-25 NOTE — Progress Notes (Signed)
   11/25/2022  Patient ID: Briana Silva, female   DOB: 29-Dec-1945, 77 y.o.   MRN: [id number]  Patient outreach to follow-up on PAP for Entresto and Eliquis  -Patient states she has received the PAP applications for both medications but has not taken time to complete these yet -She was able to get samples of both medications from cardiology, but is down to approximately a 10 day supply of these -Recommended completing and sending in as soon as possible and gave my direct number to call if she has questions or needs assistance completing -Suggested contacting cardiology to see if they could provide additional samples in the mean time  [name], PharmD, DPLA

## 2022-12-02 ENCOUNTER — Other Ambulatory Visit: Payer: Self-pay | Admitting: Cardiology

## 2022-12-02 DIAGNOSIS — I4819 Other persistent atrial fibrillation: Secondary | ICD-10-CM

## 2022-12-04 ENCOUNTER — Telehealth: Payer: Self-pay | Admitting: Family Medicine

## 2022-12-04 ENCOUNTER — Ambulatory Visit (INDEPENDENT_AMBULATORY_CARE_PROVIDER_SITE_OTHER): Payer: Medicare Other | Admitting: Family Medicine

## 2022-12-04 ENCOUNTER — Encounter: Payer: Self-pay | Admitting: Family Medicine

## 2022-12-04 VITALS — BP 145/96 | HR 86 | Ht 66.0 in | Wt 246.0 lb

## 2022-12-04 DIAGNOSIS — Z23 Encounter for immunization: Secondary | ICD-10-CM

## 2022-12-04 DIAGNOSIS — J4540 Moderate persistent asthma, uncomplicated: Secondary | ICD-10-CM | POA: Diagnosis not present

## 2022-12-04 DIAGNOSIS — I10 Essential (primary) hypertension: Secondary | ICD-10-CM

## 2022-12-04 DIAGNOSIS — J4531 Mild persistent asthma with (acute) exacerbation: Secondary | ICD-10-CM | POA: Diagnosis not present

## 2022-12-04 DIAGNOSIS — I5032 Chronic diastolic (congestive) heart failure: Secondary | ICD-10-CM | POA: Diagnosis not present

## 2022-12-04 DIAGNOSIS — R200 Anesthesia of skin: Secondary | ICD-10-CM | POA: Insufficient documentation

## 2022-12-04 DIAGNOSIS — R7301 Impaired fasting glucose: Secondary | ICD-10-CM

## 2022-12-04 MED ORDER — FLUTICASONE-SALMETEROL 250-50 MCG/ACT IN AEPB: INHALATION_SPRAY | RESPIRATORY_TRACT | 6 refills | Status: DC

## 2022-12-04 MED ORDER — ALBUTEROL SULFATE HFA 108 (90 BASE) MCG/ACT IN AERS: 2.0000 | INHALATION_SPRAY | RESPIRATORY_TRACT | 5 refills | Status: DC | PRN

## 2022-12-04 MED ORDER — ALBUTEROL SULFATE (2.5 MG/3ML) 0.083% IN NEBU: 2.5000 mg | INHALATION_SOLUTION | RESPIRATORY_TRACT | 6 refills | Status: DC | PRN

## 2022-12-04 NOTE — Progress Notes (Signed)
Established Patient Office Visit  Subjective   Patient ID: Briana Silva, female    DOB: Apr 21, 1945  Age: 77 y.o. MRN: [id number]  Chief Complaint  Patient presents with   Hypertension    HPI Hypertension- Pt denies chest pain, SOB, dizziness, or heart palpitations.  Taking meds as directed w/o problems.  Denies medication side effects.  Reports that her home blood pressures look good.  Follow-up asthma-she has not been using them consistently lately to try to make them last but she is does need refills on all of her medication they did give her the generic Advair last time.  Heart failure, diastolic she says that her swelling right now is pretty stable though she has had some swelling intermittently she does have her furosemide to take and spironolactone.  She reports intermittent numbness in her toes.  Is usually worse on the left compared to the right.  It has been going on for a couple of months at this point.     ROS    Objective:     BP (!) 145/96   Pulse 86   Ht 5\' 6"  (1.676 m)   Wt 246 lb (111.6 kg)   SpO2 94%   BMI 39.71 kg/m    Physical Exam Vitals and nursing note reviewed.  Constitutional:      Appearance: Normal appearance.  HENT:     Head: Normocephalic and atraumatic.  Eyes:     Conjunctiva/sclera: Conjunctivae normal.  Cardiovascular:     Rate and Rhythm: Normal rate and regular rhythm.  Pulmonary:     Effort: Pulmonary effort is normal.     Breath sounds: Wheezing present.     Comments: Slight expiratory wheeze little bit louder on the left compared to the right. Skin:    General: Skin is warm and dry.  Neurological:     Mental Status: She is alert.  Psychiatric:        Mood and Affect: Mood normal.      No results found for any visits on 12/04/22.    The 10-year ASCVD risk score (Arnett DK, et al., 2019) is: 20.2%    Assessment & Plan:   Problem List Items Addressed This Visit       Cardiovascular and Mediastinum    Essential hypertension - Primary    Reports home blood pressures are typically well-controlled at this morning it was 122/80 at home.      Relevant Orders   CMP14+EGFR   Lipid panel   CBC   Congestive heart failure (HCC)    She feels like her swelling is pretty stable right now on her feet she has had a few days here and there where she was more swollen but she feels like it stable today.      Relevant Orders   CMP14+EGFR   Lipid panel   CBC     Respiratory   Moderate persistent asthma   Relevant Medications   albuterol (PROAIR HFA) 108 (90 Base) MCG/ACT inhaler   albuterol (PROVENTIL) (2.5 MG/3ML) 0.083% nebulizer solution   fluticasone-salmeterol (ADVAIR) 250-50 MCG/ACT AEPB   Other Relevant Orders   CMP14+EGFR   Lipid panel   CBC     Endocrine   IFG (impaired fasting glucose)    Due for updated A1C      Relevant Orders   Hemoglobin A1c     Other   Numbness of toes    Will check for deficiencies first. Likely neuropathy, can work up further if  needed.        Relevant Orders   B12 and Folate Panel   Vitamin B1   Vitamin B6   Other Visit Diagnoses     Mild persistent asthma with acute exacerbation       Relevant Medications   albuterol (PROAIR HFA) 108 (90 Base) MCG/ACT inhaler   albuterol (PROVENTIL) (2.5 MG/3ML) 0.083% nebulizer solution   fluticasone-salmeterol (ADVAIR) 250-50 MCG/ACT AEPB   Encounter for immunization       Relevant Orders   Flu Vaccine Trivalent High Dose (Fluad) (Completed)   Pfizer Comirnaty Covid-19 Vaccine 68yrs & older (Completed)       Will see if can get assistance for Entresto.    Return in about 6 months (around 06/03/2023) for CHF,Asthma,BP.    Nani Gasser, MD

## 2022-12-04 NOTE — Progress Notes (Signed)
   12/04/2022  Patient ID: Briana Silva, female   DOB: 09-26-1945, 77 y.o.   MRN: [id number]  Patient seen by Dr. Linford Arnold today and informed her she had received PAP applications for Entresto and Eliquis but had not yet been able to complete these.  I met with patient to discuss what information is needed:  OOP expense report from pharmacy and proof of income to be able to complete these applications.  Patient states she si going to pharmacy today to pick up refills and will get OOP report.  She will also gather income information, so we can determine if she will be eligible for Eliquis PAP.  Patient does qualify for Entresto PAP, but may first need to apply for LIS based on HHI.  Telephone visit scheduled to follow-up to determine eligibility tomorrow at 430pm.  If eligible, I will see patient again in person to complete applications.  [name], PharmD, DPLA

## 2022-12-04 NOTE — Assessment & Plan Note (Signed)
Will check for deficiencies first. Likely neuropathy, can work up further if needed.

## 2022-12-04 NOTE — Telephone Encounter (Signed)
Needs help with Entresto if possible.  She has a few samples from the cardiologist right now but is almost out.

## 2022-12-04 NOTE — Assessment & Plan Note (Signed)
She feels like her swelling is pretty stable right now on her feet she has had a few days here and there where she was more swollen but she feels like it stable today.

## 2022-12-04 NOTE — Assessment & Plan Note (Signed)
Due for updated A1C.

## 2022-12-04 NOTE — Assessment & Plan Note (Addendum)
Reports home blood pressures are typically well-controlled at this morning it was 122/80 at home.

## 2022-12-05 ENCOUNTER — Other Ambulatory Visit: Payer: Self-pay

## 2022-12-05 LAB — CMP14+EGFR
ALT: 19 [IU]/L (ref 0–32)
AST: 20 [IU]/L (ref 0–40)
Albumin: 4 g/dL (ref 3.8–4.8)
Alkaline Phosphatase: 96 [IU]/L (ref 44–121)
BUN/Creatinine Ratio: 14 (ref 12–28)
BUN: 12 mg/dL (ref 8–27)
Bilirubin Total: 0.7 mg/dL (ref 0.0–1.2)
CO2: 28 mmol/L (ref 20–29)
Calcium: 10.2 mg/dL (ref 8.7–10.3)
Chloride: 102 mmol/L (ref 96–106)
Creatinine, Ser: 0.85 mg/dL (ref 0.57–1.00)
Globulin, Total: 3.1 g/dL (ref 1.5–4.5)
Glucose: 100 mg/dL — ABNORMAL HIGH (ref 70–99)
Potassium: 5.2 mmol/L (ref 3.5–5.2)
Sodium: 143 mmol/L (ref 134–144)
Total Protein: 7.1 g/dL (ref 6.0–8.5)
eGFR: 70 mL/min/{1.73_m2} (ref >59)

## 2022-12-05 LAB — CBC
Hematocrit: 46.6 % (ref 34.0–46.6)
Hemoglobin: 15 g/dL (ref 11.1–15.9)
MCH: 30 pg (ref 26.6–33.0)
MCHC: 32.2 g/dL (ref 31.5–35.7)
MCV: 93 fL (ref 79–97)
Platelets: 225 10*3/uL (ref 150–450)
RBC: 5 x10E6/uL (ref 3.77–5.28)
RDW: 11.9 % (ref 11.7–15.4)
WBC: 4.8 10*3/uL (ref 3.4–10.8)

## 2022-12-05 LAB — LIPID PANEL
Chol/HDL Ratio: 2.6 ratio (ref 0.0–4.4)
Cholesterol, Total: 172 mg/dL (ref 100–199)
HDL: 66 mg/dL (ref >39)
LDL Chol Calc (NIH): 96 mg/dL (ref 0–99)
Triglycerides: 49 mg/dL (ref 0–149)
VLDL Cholesterol Cal: 10 mg/dL (ref 5–40)

## 2022-12-05 LAB — HEMOGLOBIN A1C
Est. average glucose Bld gHb Est-mCnc: 123 mg/dL
Hgb A1c MFr Bld: 5.9 % — ABNORMAL HIGH (ref 4.8–5.6)

## 2022-12-05 NOTE — Progress Notes (Unsigned)
   12/05/2022  Patient ID: Briana Silva, female   DOB: 12-15-1945, 77 y.o.   MRN: [id number]  Patient outreach to follow-up on patient assistance for Entresto and Eliquis.  Patient has not yet been able to obtain household income information and out-of-pocket spending on medications for 2024.  This information will be needed to complete PAP applications for these medications.  Patient states she has enough Entresto to get until the end of the year.  She has 2 weeks remaining of Eliquis and plans to refill the medication once to get throgh EOY.  We have a follow-up scheduled for mid-January to reassess needs once she has a better idea of what copays will be.    [name], PharmD, DPLA

## 2022-12-05 NOTE — Progress Notes (Signed)
Call patient: Metabolic panel overall looks good.  A1c was 5.9 this is up from her previous back into the prediabetes range so just really encouraged her to work on cutting back on sweets and carbs.  B12 looks great in fact is elevated so if she is taking extra B12 she can decrease how often she is taking it.  Folate looks great.  Cholesterol and blood count look great as well.  B1 and B6 are still pending.

## 2022-12-08 LAB — B12 AND FOLATE PANEL
Folate: 14.8 ng/mL (ref >3.0)
Vitamin B-12: 1632 pg/mL — ABNORMAL HIGH (ref 232–1245)

## 2022-12-08 LAB — VITAMIN B6: Vitamin B6: 66.5 ug/L — ABNORMAL HIGH (ref 3.4–65.2)

## 2022-12-08 LAB — VITAMIN B1: Thiamine: 103.7 nmol/L (ref 66.5–200.0)

## 2022-12-08 NOTE — Progress Notes (Signed)
Call patient: B1 and B6 look great.

## 2023-02-04 ENCOUNTER — Other Ambulatory Visit: Payer: Self-pay

## 2023-02-04 NOTE — Progress Notes (Unsigned)
   02/04/2023  Patient ID: Briana Silva, female   DOB: [birth year], 78 y.o.   MRN: [id number]  Outreach attempt for scheduled follow-up visit to check on access/affordability of medications was unsuccessful.  I was able to leave a HIPAA compliant voicemail with my direct number.  If I do not hear back from the patient, will attempt to reach her again next week.  [name], PharmD, DPLA

## 2023-02-11 ENCOUNTER — Other Ambulatory Visit: Payer: Self-pay

## 2023-02-11 NOTE — Progress Notes (Unsigned)
   02/11/2023  Patient ID: Briana Silva, female   DOB: [birth year], 78 y.o.   MRN: [id number]  Out reach attempt for scheduled follow-up visit to check on adherence and affordability of medications (specifically Eliquis and Entresto) was unsuccessful.  I left a HIPAA compliant voicemail with my direct phone number for the patient to return my call.  If I do not hear back in another week, I will try to call Briana Silva again.  [name], PharmD, DPLA

## 2023-03-09 ENCOUNTER — Ambulatory Visit: Payer: Medicare Other | Admitting: Family Medicine

## 2023-04-02 DIAGNOSIS — I5023 Acute on chronic systolic (congestive) heart failure: Secondary | ICD-10-CM | POA: Diagnosis not present

## 2023-04-02 DIAGNOSIS — J45909 Unspecified asthma, uncomplicated: Secondary | ICD-10-CM | POA: Diagnosis not present

## 2023-04-02 DIAGNOSIS — R918 Other nonspecific abnormal finding of lung field: Secondary | ICD-10-CM | POA: Diagnosis not present

## 2023-04-02 DIAGNOSIS — Z8709 Personal history of other diseases of the respiratory system: Secondary | ICD-10-CM | POA: Diagnosis not present

## 2023-04-02 DIAGNOSIS — R0602 Shortness of breath: Secondary | ICD-10-CM | POA: Diagnosis not present

## 2023-04-02 DIAGNOSIS — Z1152 Encounter for screening for COVID-19: Secondary | ICD-10-CM | POA: Diagnosis not present

## 2023-04-02 DIAGNOSIS — Z9111 Patient's noncompliance with dietary regimen due to financial hardship: Secondary | ICD-10-CM | POA: Diagnosis not present

## 2023-04-02 DIAGNOSIS — Z7901 Long term (current) use of anticoagulants: Secondary | ICD-10-CM | POA: Diagnosis not present

## 2023-04-02 DIAGNOSIS — I517 Cardiomegaly: Secondary | ICD-10-CM | POA: Diagnosis not present

## 2023-04-02 DIAGNOSIS — I499 Cardiac arrhythmia, unspecified: Secondary | ICD-10-CM | POA: Diagnosis not present

## 2023-04-02 DIAGNOSIS — Z5986 Financial insecurity: Secondary | ICD-10-CM | POA: Diagnosis not present

## 2023-04-02 DIAGNOSIS — I4891 Unspecified atrial fibrillation: Secondary | ICD-10-CM | POA: Diagnosis not present

## 2023-04-02 DIAGNOSIS — E785 Hyperlipidemia, unspecified: Secondary | ICD-10-CM | POA: Diagnosis not present

## 2023-04-02 DIAGNOSIS — I5043 Acute on chronic combined systolic (congestive) and diastolic (congestive) heart failure: Secondary | ICD-10-CM | POA: Diagnosis not present

## 2023-04-02 DIAGNOSIS — Z91141 Patient's other noncompliance with medication regimen due to financial hardship: Secondary | ICD-10-CM | POA: Diagnosis not present

## 2023-04-02 DIAGNOSIS — I4819 Other persistent atrial fibrillation: Secondary | ICD-10-CM | POA: Diagnosis not present

## 2023-04-02 DIAGNOSIS — R599 Enlarged lymph nodes, unspecified: Secondary | ICD-10-CM | POA: Diagnosis not present

## 2023-04-02 DIAGNOSIS — I428 Other cardiomyopathies: Secondary | ICD-10-CM | POA: Diagnosis not present

## 2023-04-02 DIAGNOSIS — I110 Hypertensive heart disease with heart failure: Secondary | ICD-10-CM | POA: Diagnosis not present

## 2023-04-02 DIAGNOSIS — R6889 Other general symptoms and signs: Secondary | ICD-10-CM | POA: Diagnosis not present

## 2023-04-02 LAB — HEMOGLOBIN A1C: Hemoglobin A1C: 6.2

## 2023-04-06 ENCOUNTER — Other Ambulatory Visit: Payer: Self-pay | Admitting: Family Medicine

## 2023-04-06 ENCOUNTER — Telehealth: Payer: Self-pay | Admitting: Family Medicine

## 2023-04-06 ENCOUNTER — Telehealth: Payer: Self-pay

## 2023-04-06 DIAGNOSIS — I509 Heart failure, unspecified: Secondary | ICD-10-CM

## 2023-04-06 DIAGNOSIS — I4819 Other persistent atrial fibrillation: Secondary | ICD-10-CM

## 2023-04-06 DIAGNOSIS — I5042 Chronic combined systolic (congestive) and diastolic (congestive) heart failure: Secondary | ICD-10-CM

## 2023-04-06 DIAGNOSIS — I5032 Chronic diastolic (congestive) heart failure: Secondary | ICD-10-CM

## 2023-04-06 MED ORDER — DAPAGLIFLOZIN PROPANEDIOL 10 MG PO TABS: 10.0000 mg | ORAL_TABLET | Freq: Every day | ORAL | 1 refills | Status: DC

## 2023-04-06 MED ORDER — SACUBITRIL-VALSARTAN 24-26 MG PO TABS: 1.0000 | ORAL_TABLET | Freq: Two times a day (BID) | ORAL | 1 refills | Status: DC

## 2023-04-06 NOTE — Transitions of Care (Post Inpatient/ED Visit) (Signed)
 04/06/2023  Name: Briana Silva MRN: [id number] DOB: 1945/12/22  Today's TOC FU Call Status:   Patient's Name and Date of Birth confirmed.  Transition Care Management Follow-up Telephone Call Date of Discharge: 04/05/23 Discharge Facility: Other [facility]) Name of Other (Non[facility]) Discharge Facility: [facility] Type of Discharge: Inpatient Admission Primary Inpatient Discharge Diagnosis:: Acute on chronic combined systolic and diastolic CHF How have you been since you were released from the hospital?: Better Any questions or concerns?: Yes Patient Questions/Concerns:: Patient reports BP today106/68  79  - 101/53  68 today 3 minutes later Patient Questions/Concerns Addressed: Notified Provider of Patient Questions/Concerns (Provider notified for BP and medication concerns)  Items Reviewed: Did you receive and understand the discharge instructions provided?: Yes Medications obtained,verified, and reconciled?: Yes (Medications Reviewed) Any new allergies since your discharge?: No Dietary orders reviewed?: Yes Type of Diet Ordered:: Heart healthy Do you have support at home?: Yes People in Home: child(ren), adult Name of Support/Comfort Primary Source: Daughter lives close  Medications Reviewed Today: Medications Reviewed Today     Reviewed by Jessy Oto, RN (Registered Nurse) on 04/06/23 at 1333  Med List Status: <None>   Medication Order Taking? Sig Documenting Provider Last Dose Status Informant  albuterol (PROAIR HFA) 108 (90 Base) MCG/ACT inhaler [id number] Yes Inhale 2 puffs into the lungs every 4 (four) hours as needed for wheezing or shortness of breath. Agapito Games, MD Taking Active   albuterol (PROVENTIL) (2.5 MG/3ML) 0.083% nebulizer solution [id number] No Take 3 mLs (2.5 mg total) by nebulization every 4 (four) hours as needed for wheezing or shortness of breath. Dx:J45.41  Patient not taking: Reported on 04/06/2023   Agapito Games, MD Not Taking Active   apixaban (ELIQUIS) 5 MG TABS tablet [id number] No Take 1 tablet (5 mg total) by mouth 2 (two) times daily.  Patient not taking: Reported on 04/06/2023   Lewayne Bunting, MD Not Taking Active            Med Note [facility], [facility] A   Mon Apr 06, 2023  1:08 PM) Patient needs refill - message sent to PCP  carvedilol (COREG) 12.5 MG tablet [id number] Yes Take 1 tablet (12.5 mg total) by mouth 2 (two) times daily with a meal. Jens Som, Madolyn Frieze, MD Taking Active   cetirizine (ZYRTEC) 10 MG tablet [id number] Yes Take 1 tablet (10 mg total) by mouth daily as needed for allergies or rhinitis. Agapito Games, MD Taking Active   Cholecalciferol (QC VITAMIN D3) 50 MCG (2000 UT) TABS [id number] Yes Take 1 tablet by mouth daily. [provider] Taking Active   dapagliflozin propanediol (FARXIGA) 10 MG TABS tablet [id number] No Take 10 mg by mouth daily. Patient needs financial assist - referring to pharmacist  Patient not taking: Reported on 04/06/2023   [provider] Not Taking Active            Med Note [facility], Deshana Rominger A   Mon Apr 06, 2023 12:19 PM) Patient reports she does not have this med and needs patient assistance  digoxin (LANOXIN) 0.125 MG tablet [id number] Yes Take 1 tablet by mouth once daily Lewayne Bunting, MD Taking Active   ferrous sulfate 324 MG TBEC [id number] No Take 324 mg by mouth.  Patient not taking: Reported on 04/06/2023   [provider] Not Taking Active            Med Note [facility], Teralyn Mullins A   Mon Apr 06, 2023  11:47 AM) 04/06/23 Patient reports currently out-will pick up more   fluticasone-salmeterol (ADVAIR) 250-50 MCG/ACT AEPB [id number] Yes INHALE 1 DOSE BY MOUTH INTO THE LUNGS TWICE DAILY Agapito Games, MD Taking Active   furosemide (LASIX) 20 MG tablet [id number] Yes Take 1 tablet (20 mg total) by mouth daily.  Patient taking differently: Take 40 mg by mouth daily. Dose increased to 40mg  with 04/05/23  hospital dc   Agapito Games, MD Taking Active   Omega-3 Fatty Acids (FISH OIL) 1000 MG CAPS [id number] Yes Take 1 capsule by mouth daily. [provider] Taking Active   pravastatin (PRAVACHOL) 40 MG tablet [id number] Yes Take 40 mg by mouth daily. [provider]  Active   Prenatal Vit-Fe Fumarate-FA (PRENATAL MULTIVITAMIN) TABS tablet [id number] Yes Take 1 tablet by mouth daily at 12 noon. [provider] Taking Active   sacubitril-valsartan (ENTRESTO) 24-26 MG [id number] No Take 1 tablet by mouth 2 (two) times daily.  Patient not taking: Reported on 04/06/2023   Lewayne Bunting, MD Not Taking Active            Med Note [facility], [place] A   Mon Apr 06, 2023  1:33 PM) Patient reports she is out - needs patient assistance-not listed on 04/05/23 discharge-message sent to PCP  spironolactone (ALDACTONE) 25 MG tablet [id number] Yes Take 1 tablet by mouth once daily Lewayne Bunting, MD Taking Active            Med Note [facility], Lilliauna Van A   Mon Apr 06, 2023 12:13 PM) Patient reports taking - not on 04/05/23 discharge - message sent to PCP            Home Care and Equipment/Supplies: Were Home Health Services Ordered?: No Any new equipment or medical supplies ordered?: No  Functional Questionnaire: Do you need assistance with bathing/showering or dressing?: No Do you need assistance with meal preparation?: No Do you need assistance with eating?: No Do you have difficulty maintaining continence: Yes (occasional bladder incontinence with Lasix) Do you need assistance with getting out of bed/getting out of a chair/moving?: No Do you have difficulty managing or taking your medications?: No  Follow up appointments reviewed: PCP Follow-up appointment confirmed?: Yes Date of PCP follow-up appointment?: 04/13/23 Follow-up Provider: Agapito Games, MD Specialist Hospital Follow-up appointment confirmed?: NA Do you need transportation to your follow-up  appointment?: No Do you understand care options if your condition(s) worsen?: Yes-patient verbalized understanding  SDOH Interventions Today    Flowsheet Row Most Recent Value  SDOH Interventions   Food Insecurity Interventions AMB Referral  Housing Interventions Intervention Not Indicated  Transportation Interventions Intervention Not Indicated  Utilities Interventions Intervention Not Indicated       Goals Addressed               This Visit's Progress     TOC Care Plan - Patient will report no readmissions in the next 30 days (pt-stated)        Current Barriers:  Financial Constraints: Patient with 3 high cost medications - reports skipping meds to buy food Non-adherence to prescribed medication regimen: Due to cost of high cost medications - patient skips doses   RNCM Clinical Goal(s):  Patient will work with the Care Management team over the next 30 days to address Transition of Care Barriers: Medication Management Patient will take all medications exactly as prescribed and will call provider for medication related questions as evidenced by patient report and health record Patient will  demonstrate understanding of rationale for each prescribed medication as evidenced by patient verbalization Patient will attend all scheduled medical appointments: PCP appointment 04/13/23 Patient will work with pharmacist to address related to high cost medications Sherryll Burger, [place], Eliquis) as evidenced by review or EMR and patient or pharmacist report Patient will work with Child psychotherapist to address  related to the management of Food Insecurity as evidenced by review of EMR and patient or Child psychotherapist report   Interventions: Evaluation of current treatment plan related to  self management and patient's adherence to plan as established by provider  Transitions of Care:  New goal. Doctor Visits  - discussed the importance of doctor visits Contacted provider for patient needs related to  medications and BP readings - Message sent to PCP Conference call placed with patient to [facility] on [address] to review medications Referrals for pharmacist and SW completed  Educated patient to check BP prior to take medications that lower BP and to obtain parameters from provider  Heart Failure Interventions:  (Status:  New goal.) Short Term Goal Provided education on low sodium diet Reviewed role of diuretics in prevention of fluid overload and management of heart failure; Assessed social determinant of health barriers   Patient Goals/Self-Care Activities: Participate in Transition of Care Program/Attend [facility] scheduled calls Notify RN Care Manager of TOC call rescheduling needs Take all medications as prescribed Attend all scheduled provider appointments Call pharmacy for medication refills 3-7 days in advance of running out of medications call office if I gain more than 2 pounds in one day or 5 pounds in one week use salt in moderation  Follow Up Plan:  Telephone follow up appointment with care management team member scheduled for:  04/14/23 11am The patient has been provided with contact information for the care management team and has been advised to call with any health related questions or concerns.          Hilbert Odor RN, CCM [place]  VBCI-Population Health RN Care Manager [phone]

## 2023-04-06 NOTE — Progress Notes (Signed)
 Meds ordered this encounter  Medications   sacubitril-valsartan (ENTRESTO) 24-26 MG    Sig: Take 1 tablet by mouth 2 (two) times daily.    Dispense:  60 tablet    Refill:  1    Lot Number?:   [id number]    Expiration Date?:   09/07/2022    Quantity:   28   dapagliflozin propanediol (FARXIGA) 10 MG TABS tablet    Sig: Take 1 tablet (10 mg total) by mouth daily. Patient needs financial assist - referring to pharmacist    Dispense:  30 tablet    Refill:  1

## 2023-04-06 NOTE — Telephone Encounter (Signed)
 Briana Silva - have her complete her part and drop off to us,to sign and fax.( Snice she has the forms).    Eliquis sent to pharmacy but yes she needs to call number bc will be over $300.   I can refill pravastain if she needs it.    Ok to take fish oil.   If we can get the [facility] covered as well then we might be able to decrease other meds.    Ok for BP to be > 100/75.  With heart failure prefer to run lover.  Can take half a tab of lasix or skip for one day if BP < 100

## 2023-04-06 NOTE — Telephone Encounter (Signed)
-----   Message from Nurse Raquel James sent at 04/06/2023 12:41 PM EDT ----- Regarding: Patient BP and Medication Questions Dr Linford Arnold,  Patient discharged from [facility] 04/05/23 - Acute on Chronic CHF  Patient reported to me today her 106/68  79  - 101/53  68 today 3 minutes later - we discussed obtaining parameters from you as well as checking BP before taking medications.  Per discharge - Lasix was increased to 40mg   - patient reports taking this and Carvedilol this morning  We made a conference call to her pharmacy [facility] [address]:   Patient reports she has paperwork for [place] assistance that needs to be completed - States she does not have this med - pharmacy states last filled 04/2021   Regarding Entresto (listed in [facility] but not on DC summary) - patient needs assistance if she is to continue taking this  Regarding Eliquis - Has no refills and is out of medication   #Patient was given [phone] for Eliquis and Entresto patient assistance  Spironolactone was in EPIC but not on d/c summary-patient states she takes this and has plenty  Pravastatin was on d/c summary but not in EPIC - patient states she has no prescription - pharmacy states it was last filled 12/2020  Omega 3 fish oil in EPIC but not on d/c list   Patient has an appointment with you 04/13/23 and states she does not follow closely with cardiology, [name] - Can you please advise of all of the above?    Hilbert Odor RN, CCM [place]  VBCI-Population Health RN Care Manager [phone]

## 2023-04-07 ENCOUNTER — Telehealth: Payer: Self-pay

## 2023-04-07 NOTE — Patient Instructions (Addendum)
 Visit Information  Thank you for taking time to visit with me today. Please don't hesitate to contact me if I can be of assistance to you before our next scheduled telephone appointment.  Our next appointment is by telephone on 04/14/23 at 11am  As discussed, your provider wants you to complete the paperwork for the Farxiga, call the 800 number for the Eliquis and Entresto, monitor BP and if the top numer is <100, you can take 1/2 tab or skip the dose of Lasix. She also states it is okay to take the fish oil and if you need her to, she can refill the Pravastatin.    Telephone follow up appointment with care management team member scheduled for: The patient has been provided with contact information for the care management team and has been advised to call with any health related questions or concerns.   Please call the care guide team at [phone] if you need to cancel or reschedule your appointment.   Please call the Suicide and Crisis Lifeline: 988 call the [place] National Suicide Prevention Lifeline: (814)151-3113 or TTY: 8648603052 TTY 5103622966) to talk to a trained counselor call 1-800-273-TALK (toll free, 24 hour hotline) call 911 if you are experiencing a Mental Health or Behavioral Health Crisis or need someone to talk to.  Hilbert Odor RN, CCM [place]  VBCI-Population Health RN Care Manager [phone]

## 2023-04-07 NOTE — Patient Outreach (Signed)
 Care Management  Transitions of Care Program Transitions of Care Post-discharge Week 1 Follow Up   04/07/2023 Name: Briana Silva MRN: [id number] DOB: 12-12-1945  Subjective: Briana Silva is a 78 y.o. year old female who is a primary care patient of Agapito Games, MD. The Care Management team Engaged with patient Engaged with patient by telephone to assess and address transitions of care needs.   Consent to Services:  Patient was given information about care management services, agreed to services, and gave verbal consent to participate.   Assessment:     TOC RN received response to message that was sent to PCP regarding medications and placed call to patient to review. Patient was advised to complete her part of the [place] paperwork and take to PCP office. Patient was encouraged to call the 800 number given to her by this RN for patient assistance for Eliquis and Entresto. Patient advised that MD said she can take the fish oil and the pravastatin, and that MD said okay for BP to be a little lower with HF but that she can take 1/2 tab of Lasix or skip for just one day if SBP<100. Patient verbalized understanding and states she will work on the paperwork and calling the 800 number.      Plan: Telephone follow up appointment with care management team member scheduled for: 04/14/23 11am as previously scheduled The patient has been provided with contact information for the care management team and has been advised to call with any health related questions or concerns.   Hilbert Odor RN, CCM [place]  VBCI-Population Health RN Care Manager [phone]

## 2023-04-07 NOTE — Patient Outreach (Signed)
ERROR

## 2023-04-08 ENCOUNTER — Other Ambulatory Visit

## 2023-04-08 ENCOUNTER — Other Ambulatory Visit: Payer: Self-pay

## 2023-04-08 ENCOUNTER — Telehealth: Payer: Self-pay | Admitting: *Deleted

## 2023-04-08 NOTE — Telephone Encounter (Signed)
 Thank you for the information have a nice day!

## 2023-04-08 NOTE — Progress Notes (Unsigned)
   04/08/2023  Patient ID: Briana Silva, female   DOB: [birth year], 78 y.o.   MRN: [id number]  Outreach attempt to assist with access/affordability to medications was unsuccessful, but I was able to leave HIPAA compliant voicemail with my direct phone number.  I will try to call the patient again at the beginning of next week if I do not hear back.  [name], PharmD, DPLA

## 2023-04-08 NOTE — Progress Notes (Signed)
 Complex Care Management Note Care Guide Note  04/08/2023 Name: Briana Silva MRN: [id number] DOB: 07-08-1945  Briana Silva is a 78 y.o. year old female who is a primary care patient of Metheney, Barbarann Ehlers, MD . The community resource team was consulted for assistance with Food Insecurity  SDOH screenings and interventions completed:  Yes  Social Drivers of Health From This Encounter   Food Insecurity: Food Insecurity Present (04/08/2023)   Hunger Vital Sign    Worried About Running Out of Food in the Last Year: Sometimes true    Ran Out of Food in the Last Year: Sometimes true    SDOH Interventions Today    Flowsheet Row Most Recent Value  SDOH Interventions   Food Insecurity Interventions AMB Referral, Community Resources Provided, [id number] Referral  [Patient sent referal for mow and also [facility] meal program]  Social Connections Interventions Community Resources Provided  Louanne Belton senior resources]        Care guide performed the following interventions: Patient provided with information about care guide support team and interviewed to confirm resource needs Obtained verbal consent to place patient referral to [facility] .  Follow Up Plan:  No further follow up planned at this time. The patient has been provided with needed resources.   Encounter Outcome:  Patient Visit Completed  Dione Booze  [facility] Health[facility] Care Guide  Direct Dial:[phone] Fax:[phone] Website: [email or web address]

## 2023-04-08 NOTE — Telephone Encounter (Signed)
 Copied from CRM [id number]. Topic: General - Call Back - No Documentation >> Apr 08, 2023  2:50 PM Briana Silva wrote: Reason for CRM: Patient called states she received a call from provider office but she was on another call with [facility] and not able to answer. She has not checked messages and not sure who tried to call. If someone needs to speak with her, can give her a call back. Thank You Did you call this patient?

## 2023-04-09 ENCOUNTER — Other Ambulatory Visit: Payer: Self-pay

## 2023-04-09 DIAGNOSIS — I4819 Other persistent atrial fibrillation: Secondary | ICD-10-CM

## 2023-04-09 MED ORDER — APIXABAN 5 MG PO TABS
5.0000 mg | ORAL_TABLET | Freq: Two times a day (BID) | ORAL | 3 refills | Status: DC
Start: 2023-04-09 — End: 2023-08-05

## 2023-04-09 NOTE — Progress Notes (Signed)
   04/09/2023  Patient ID: Germaine Pomfret, female   DOB: [birth year], 78 y.o.   MRN: [id number]  Subjective/Objective Patient outreach to assist with affordability of the following medications:  Eliquis 5mg  BID, Farxiga 10mg  daily, Entresto 24/26mg  BID  Medication Access/Affordability -Patient is currently out of Sherryll Burger, and endorses copay for this, Marcelline Deist, and Eliquis are expensive -Eliquis prescription is also out of refills  Assessment/Plan  Medication Access/Affordability -Enrolled patient in [employer] for cardiomyopathy that will cover Medicare copays for Sherryll Burger and Farxiga.  Entresto ready at pharmacy and now going through for $0 copay- patient will need to request that Marcelline Deist be billed to this next fill (recently picked up) -Pending prescription for Eliquis for Dr. Jens Som to sign, so I can see what patient copay for this medication is.  Will not qualify for Eliquis PAP yet based on 3% OOP requirement of that program.  Follow-up:  Once Eliquis sent to pharmacy, I will call to check copy and follow-up with patient  [name], PharmD, DPLA

## 2023-04-10 ENCOUNTER — Telehealth: Payer: Self-pay

## 2023-04-10 NOTE — Progress Notes (Signed)
   04/10/2023  Patient ID: Briana Silva, female   DOB: 1945-12-27, 78 y.o.   MRN: [id number]  Eliquis 5mg  BID sent to [facility] by Dr. Jens Som.  Contacted the pharmacy, and medication is going through for $47 copay.  Contacted patient to inform her Farxiga 10mg  and Entreso 24/26mg  Medicare copays will be covered through [employer].  Advised her to always verify pharmacy is billing correctly- copay should be $0.  Informed her that Eliquis copay will be $47, and she endorses affordability of this since [place] and Sherryll Burger will be $0.  Will check in with patient again in 3 months.  [name], PharmD, DPLA

## 2023-04-13 ENCOUNTER — Telehealth: Payer: Self-pay | Admitting: *Deleted

## 2023-04-13 ENCOUNTER — Encounter: Payer: Self-pay | Admitting: Family Medicine

## 2023-04-13 ENCOUNTER — Ambulatory Visit (INDEPENDENT_AMBULATORY_CARE_PROVIDER_SITE_OTHER): Admitting: Family Medicine

## 2023-04-13 VITALS — BP 124/84 | HR 80 | Ht 66.0 in | Wt 229.0 lb

## 2023-04-13 DIAGNOSIS — I5032 Chronic diastolic (congestive) heart failure: Secondary | ICD-10-CM

## 2023-04-13 DIAGNOSIS — I4819 Other persistent atrial fibrillation: Secondary | ICD-10-CM

## 2023-04-13 DIAGNOSIS — R590 Localized enlarged lymph nodes: Secondary | ICD-10-CM | POA: Insufficient documentation

## 2023-04-13 NOTE — Assessment & Plan Note (Signed)
 Recommended repeat CT in 1 month we will go ahead and order that and get it approved with the insurance and get it scheduled will have it done at [facility] since the original film was also done at [facility] for comparisons.

## 2023-04-13 NOTE — Assessment & Plan Note (Signed)
She is rate controlled.

## 2023-04-13 NOTE — Assessment & Plan Note (Signed)
 Stable. No sign of volume overload.  She has been monitoring her weights daily and only needed to take an extra Lasix 2 days a week it looks like on the discharge note they had encouraged her to take it 2 tabs daily.  We did discuss that I would need to recheck her kidney function potassium in a couple of weeks.

## 2023-04-13 NOTE — Progress Notes (Signed)
 Established Patient Office Visit  Subjective  Patient ID: Briana Silva, female    DOB: [birth year]  Age: 78 y.o. MRN: [id number]  Chief Complaint  Patient presents with   Hospitalization Follow-up    HPI Here today for hospital follow-up she was admitted on March 20 at [facility] health and discharged home 3 days later on March 23 for acute on chronic combined systolic and diastolic congestive heart failure with atrial fibrillation with RVR.  She did have a CT of her chest without contrast showing groundglass opacity within the lungs which could reflect pneumonia or edema slight decrease in size of a nodule in the right major fissure some moderate cardiomegaly.  And borderline adenopathy within the upper mediastinum that they felt could be reactive or neoplastic so they recommended 1 month follow-up CT.  They also noted some cirrhosis as well as some constipation.  Part of the issue is that she had not been on some of the medications which she could no longer afford.  We have connected with her with our clinical pharmacist and they have been able to get some drug assistance for some of her more expensive drugs so hopefully that will make a big difference.  Is been doing well since she has been home.  She has been weighing herself every day and is only needed to take an extra tab of Lasix twice.  She denies any change in swelling in her extremities.  She has been monitoring that carefully.    ROS    Objective:     BP 124/84   Pulse 80   Ht 5\' 6"  (1.676 m)   Wt 229 lb (103.9 kg)   SpO2 94%   BMI 36.96 kg/m    Physical Exam Vitals and nursing note reviewed.  Constitutional:      Appearance: Normal appearance.  HENT:     Head: Normocephalic and atraumatic.  Eyes:     Conjunctiva/sclera: Conjunctivae normal.  Cardiovascular:     Rate and Rhythm: Normal rate and regular rhythm.  Pulmonary:     Effort: Pulmonary effort is normal.     Breath sounds: Normal breath sounds.   Skin:    General: Skin is warm and dry.  Neurological:     Mental Status: She is alert.  Psychiatric:        Mood and Affect: Mood normal.      Results for orders placed or performed in visit on 04/13/23  Hemoglobin A1c  Result Value Ref Range   Hemoglobin A1C 6.2       The 10-year ASCVD risk score (Arnett DK, et al., 2019) is: 17.3%    Assessment & Plan:   Problem List Items Addressed This Visit       Cardiovascular and Mediastinum   Persistent atrial fibrillation (HCC)   She is rate controlled.      Mediastinal adenopathy   Recommended repeat CT in 1 month we will go ahead and order that and get it approved with the insurance and get it scheduled will have it done at [facility] since the original film was also done at [facility] for comparisons.      Relevant Orders   CT Chest Wo Contrast   Congestive heart failure (HCC) - Primary   Stable. No sign of volume overload.  She has been monitoring her weights daily and only needed to take an extra Lasix 2 days a week it looks like on the discharge note they had encouraged her to take it  2 tabs daily.  We did discuss that I would need to recheck her kidney function potassium in a couple of weeks.       Continue to monitor blood pressures daily.  Use extra Lasix as needed.  Try to keep track of how often you are using it. Work on getting your CT scheduled next month.  Return in about 6 weeks (around 05/25/2023) for Heart failure.  .   I spent 40 minutes on the day of the encounter to include pre-visit record review, face-to-face time with the patient and post visit ordering of test.  Nani Gasser, MD

## 2023-04-13 NOTE — Patient Instructions (Addendum)
 Continue to monitor blood pressures daily.  Use extra Lasix as needed.  Try to keep track of how often you are using it. Work on getting your CT scheduled next month.

## 2023-04-13 NOTE — Telephone Encounter (Signed)
 Dr. Linford Arnold has placed an Order for her to have a CT done at [facility].   Fwd to Cala Bradford for scheduling.

## 2023-04-13 NOTE — Telephone Encounter (Signed)
 Thank you :)

## 2023-04-14 ENCOUNTER — Other Ambulatory Visit: Payer: Self-pay

## 2023-04-14 NOTE — Patient Outreach (Signed)
 Care Management  Transitions of Care Program Transitions of Care Post-discharge week 2   04/14/2023 Name: Briana Silva MRN: [id number] DOB: 1945/12/29  Subjective: Briana Silva is a 78 y.o. year old female who is a primary care patient of Agapito Games, MD. The Care Management team Engaged with patient Engaged with patient by telephone to assess and address transitions of care needs.   Consent to Services:  Patient was given information about care management services, agreed to services, and gave verbal consent to participate.   Assessment:     Patient pleased that Sherryll Burger and Mickle Plumb has been covered and she has $0 copay. Patient confirms she has picked up her meds including the Eliquis. Patient confirms seeing PCP yesterday and states she will continue to follow with PCP for management. TOC RN and patient reviewed heart failure versus asthma. Patient reports she is weighing daily and understands when to call MD. Patient agreeable to ongoing follow-up by [facility] RN      SDOH Interventions    Flowsheet Row Telephone from 04/08/2023 in [place] POPULATION HEALTH DEPARTMENT Telephone from 04/06/2023 in [place] POPULATION HEALTH DEPARTMENT Office Visit from 10/06/2022 in [facility] Primary Care & Sports Medicine at [facility] Office Visit from 09/30/2021 in [facility] & [facility] Medicine at [facility] Office Visit from 09/28/2020 in [facility] Primary Care & Sports Medicine at [facility]  SDOH Interventions       Food Insecurity Interventions AMB Referral, Community Resources Provided, [id number] Referral  [Patient sent referal for mow and also [facility] meal program] AMB Referral Intervention Not Indicated -- Intervention Not Indicated  Housing Interventions -- Intervention Not Indicated Intervention Not Indicated -- --  Transportation Interventions -- Intervention Not Indicated Intervention Not Indicated -- Intervention Not Indicated   Utilities Interventions -- Intervention Not Indicated Intervention Not Indicated -- --  Alcohol Usage Interventions -- -- Intervention Not Indicated (Score <7) -- --  Financial Strain Interventions -- -- Intervention Not Indicated -- Intervention Not Indicated  Physical Activity Interventions -- -- Intervention Not Indicated Other (Comments)  [she is planning to all medicare to get medicare part c so she can sign up for [employer]] Intervention Not Indicated  Stress Interventions -- -- Intervention Not Indicated -- Intervention Not Indicated  Social Connections Interventions Community Resources Provided  [employer] senior resources] -- Intervention Not Indicated -- Intervention Not Indicated  Health Literacy Interventions -- -- Intervention Not Indicated -- --        Goals Addressed               This Visit's Progress     TOC Care Plan - Patient will report no readmissions in the next 30 days (pt-stated)        Current Barriers:  Financial Constraints: Patient with 3 high cost medications - reports skipping meds to buy food - 04/14/23 - Sherryll Burger and Marcelline Deist have been covered by a grant and were picked up at no cost to patient - Eliquis cost is $47 which patient reports she was able to afford with the no cost of other meds.  Non-adherence to prescribed medication regimen: Due to cost of high cost medications - patient skips doses 04/14/23 Patient reports she can now take her medications as prescribed  RNCM Clinical Goal(s):  Patient will work with the Care Management team over the next 30 days to address Transition of Care Barriers: Medication Management Patient will take all medications exactly as prescribed and will call provider  for medication related questions as evidenced by patient report and health record Patient will demonstrate understanding of rationale for each prescribed medication as evidenced by patient verbalization Patient will attend all scheduled medical appointments: PCP  appointment 04/13/23 - completed  Patient will work with pharmacist to address related to high cost medications Sherryll Burger, [place], Eliquis) as evidenced by review or EMR and patient or pharmacist report - Addressed per 04/14/23 Patient will work with Child psychotherapist to address  related to the management of Food Insecurity as evidenced by review of EMR and patient or Child psychotherapist report   Interventions: Evaluation of current treatment plan related to  self management and patient's adherence to plan as established by provider  Transitions of Care: Goal on track. Doctor Visits  - discussed the importance of doctor visits Contacted provider for patient needs related to medications and BP readings - Message sent to PCP Conference call placed with patient to [facility] on [facility] to review medications Referrals for pharmacist and SW completed  Educated patient to check BP prior to take medications that lower BP and to obtain parameters from provider   Heart Failure Interventions:  (Status:  Goal on track.) Short Term Goal Provided education on low sodium diet Reviewed role of diuretics in prevention of fluid overload and management of heart failure; Assessed social determinant of health barriers  Explained in more detail heart failure and volume overload versus asthma  Discussed asthma triggers   Patient Goals/Self-Care Activities: Participate in Transition of Care Program/Attend TOC scheduled calls Notify RN Care Manager of TOC call rescheduling needs Take all medications as prescribed Attend all scheduled provider appointments Call pharmacy for medication refills 3-7 days in advance of running out of medications call office if I gain more than 2 pounds in one day or 5 pounds in one week use salt in moderation  Follow Up Plan:  Telephone follow up appointment with care management team member scheduled for:  04/21/23 10am The patient has been provided with contact information  for the care management team and has been advised to call with any health related questions or concerns.          Plan: Telephone follow up appointment with care management team member scheduled for: 04/21/23 10am The patient has been provided with contact information for the care management team and has been advised to call with any health related questions or concerns.   Hilbert Odor RN, CCM [place]  VBCI-Population Health RN Care Manager [phone]

## 2023-04-20 NOTE — Telephone Encounter (Signed)
 Attempted call to patient. Left a voice mail message requesting a return call.

## 2023-04-21 ENCOUNTER — Telehealth: Payer: Self-pay

## 2023-04-21 ENCOUNTER — Other Ambulatory Visit: Payer: Self-pay

## 2023-04-21 NOTE — Patient Outreach (Signed)
 Transition of Care week 3  Visit Note  04/21/2023  Name: Briana Silva MRN: [id number]          DOB: 22-Feb-1945  Situation: Patient enrolled in [facility] 30-day program. Visit completed with patient by telephone.   Background: Patient discharged 04/05/23 from [place] related to CHF. Patient is weighing daily and reports 225lbs today. Patient continues to monitor and record BP. Discussed CHF management and importance of weighing, monitoring for shortness of breath, and swelling and calling MD with symptoms.   Initial Transition Care Management Follow-up Telephone Call    Past Medical History:  Diagnosis Date   Abnormal echocardiogram 09/18/2016   Mild LVH, EF 55-65%, moderately dilated LA and RA   Allergy    Asthma    Asthma    Atrial tachycardia (HCC)    Cardiomyopathy (HCC)    CHF (congestive heart failure) (HCC)    Depression    Hyperlipidemia    Hypertension    Obesity     Assessment: Patient Reported Symptoms:  Cognitive   Neurological      HEENT      Cardiovascular No Chest pain or discomfort    Respiratory      Endocrine      Gastrointestinal No symptoms reported    Genitourinary      Integumentary      Musculoskeletal      Psychosocial       Vitals:   04/21/23 0700  BP: 107/67  Pulse: 62    Medications Reviewed Today     Reviewed by Jessy Oto, RN (Registered Nurse) on 04/21/23 at 1013  Med List Status: <None>   Medication Order Taking? Sig Documenting Provider Last Dose Status Informant  albuterol (PROAIR HFA) 108 (90 Base) MCG/ACT inhaler [id number] Yes Inhale 2 puffs into the lungs every 4 (four) hours as needed for wheezing or shortness of breath. Agapito Games, MD Taking Active   apixaban (ELIQUIS) 5 MG TABS tablet [id number] Yes Take 1 tablet (5 mg total) by mouth 2 (two) times daily. Lewayne Bunting, MD Taking Active            Med Note Littie Deeds, CHERYL A   Fri Apr 10, 2023 10:19 AM)    carvedilol (COREG) 12.5  MG tablet [id number] Yes Take 1 tablet (12.5 mg total) by mouth 2 (two) times daily with a meal. Jens Som, Madolyn Frieze, MD Taking Active   cetirizine (ZYRTEC) 10 MG tablet [id number] Yes Take 1 tablet (10 mg total) by mouth daily as needed for allergies or rhinitis. Agapito Games, MD Taking Active   Cholecalciferol (QC VITAMIN D3) 50 MCG (2000 UT) TABS [id number] Yes Take 1 tablet by mouth daily. [provider] Taking Active   dapagliflozin propanediol (FARXIGA) 10 MG TABS tablet [id number] Yes Take 1 tablet (10 mg total) by mouth daily. Patient needs financial assist - referring to pharmacist Agapito Games, MD Taking Active            Med Note Littie Deeds, [place] A   Fri Apr 10, 2023 10:19 AM) [employer] grant covers Medicare copay  digoxin (LANOXIN) 0.125 MG tablet [id number] Yes Take 1 tablet by mouth once daily Lewayne Bunting, MD Taking Active   ferrous sulfate 324 MG TBEC [id number] Yes Take 324 mg by mouth. [provider] Taking Active            Med Note Morey Hummingbird, Azalynn Maxim A   Tue Apr 14, 2023 11:09 AM) Patient states she has this  med now and is taking  fluticasone-salmeterol (ADVAIR) 250-50 MCG/ACT AEPB [id number] Yes INHALE 1 DOSE BY MOUTH INTO THE LUNGS TWICE DAILY Agapito Games, MD Taking Active   furosemide (LASIX) 20 MG tablet [id number] Yes Take 1 tablet (20 mg total) by mouth daily.  Patient taking differently: Take 40 mg by mouth daily. Dose increased to 40mg  with 04/05/23 hospital dc   Agapito Games, MD Taking Active   Omega-3 Fatty Acids (FISH OIL) 1000 MG CAPS [id number] Yes Take 1 capsule by mouth daily. [provider] Taking Active   Prenatal Vit-Fe Fumarate-FA (PRENATAL MULTIVITAMIN) TABS tablet [id number] Yes Take 1 tablet by mouth daily at 12 noon. [provider] Taking Active   sacubitril-valsartan (ENTRESTO) 24-26 MG [id number] Yes Take 1 tablet by mouth 2 (two) times daily. Agapito Games, MD Taking Active             Med Note Littie Deeds, CHERYL A   Fri Apr 10, 2023 10:19 AM) [employer] grant covers Medicare copay  spironolactone (ALDACTONE) 25 MG tablet [id number] Yes Take 1 tablet by mouth once daily Jens Som Madolyn Frieze, MD Taking Active            Med Note Littie Deeds, CHERYL A   Fri Apr 10, 2023 10:20 AM)              Recommendation:   PCP Follow-up  Follow Up Plan:   Telephone follow up appointment date/time:  04/29/23 10am  Hilbert Odor RN, CCM [place]  VBCI-Population Health RN Care Manager [phone]

## 2023-04-23 NOTE — Telephone Encounter (Signed)
 Attempted to call pt but unable to reach, unable to leave VM as when machine was kicking in, the line was disconnected. Will try to call back later.

## 2023-04-27 DIAGNOSIS — R590 Localized enlarged lymph nodes: Secondary | ICD-10-CM | POA: Diagnosis not present

## 2023-04-27 DIAGNOSIS — R918 Other nonspecific abnormal finding of lung field: Secondary | ICD-10-CM | POA: Diagnosis not present

## 2023-04-27 NOTE — Telephone Encounter (Signed)
 Attempted call to [name] RN, CCM - busy signal x 2 - unable to leave a voice mail message.

## 2023-04-29 ENCOUNTER — Telehealth: Payer: Self-pay
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
## 2023-04-30 NOTE — Progress Notes (Signed)
 Hi Briana Silva, I got your CT scan back.  Said the lymph nodes that we were following up on actually seem to have improved which is fantastic so they have gotten smaller.  But there is a little nodule in that left lower lobe that they feel like has changed a little bit so they do recommend repeating a CT in 1 to 3 months.  So lets plan to do that.  They also caught a nodule in your right thyroid on the scan.  Think we have ever picked up on this before that I know of.  So I would like to get you scheduled for an ultrasound to look at the entire gland and to get better measurements on the nodule.  If you are okay with that then please let me know.  We do do those ultrasounds downstairs in our building but if you prefer different location then please let me know.  I do also like to get some labs updated to check your thyroid function.

## 2023-05-05 ENCOUNTER — Telehealth: Payer: Self-pay

## 2023-05-05 NOTE — Telephone Encounter (Signed)
Patient informed of CT results 

## 2023-05-05 NOTE — Telephone Encounter (Signed)
 Copied from CRM [id number]. Topic: Clinical - Medical Advice >> May 05, 2023  9:34 AM Arlie Benedict B wrote: Reason for CRM: Patient is requesting a call back in regards to a yellow fever injection. She stated she will be traveling to [place] in June, and needed to see how to go about acquiring that injection. [phone]

## 2023-05-05 NOTE — Telephone Encounter (Signed)
 Message sent to provider regarding this in separate message

## 2023-05-05 NOTE — Telephone Encounter (Signed)
 Copied from CRM [id number]. Topic: Clinical - Lab/Test Results >> May 05, 2023  9:32 AM Briana Silva wrote: Reason for CRM: Patient was calling to check status of her CT scan please call her at [phone]

## 2023-05-06 ENCOUNTER — Telehealth: Payer: Self-pay

## 2023-05-06 DIAGNOSIS — I5042 Chronic combined systolic (congestive) and diastolic (congestive) heart failure: Secondary | ICD-10-CM

## 2023-05-06 NOTE — Telephone Encounter (Signed)
 Spoke with [name], RN States patient was able to get all three medications, Farxiga , Entresto  and Eliquis .   She was wanting to know if patient was suppose to be on Spironolactone  states the patient states she is taking this and was taking previous to hospital admit but d/c summary stated  would consider starting if SOB and edema.   She also  states patient was previously on pravastatin 40mg  if Dr. Greer Leak would like her to continue this medication but states shows last filled 12/2020  Patient vitals today  Weight 220.7lb BP 114/68  HR 69 Patient was discharged today from transition of care and moved to chronic care management.   She also wanted to let Dr. Greer Leak know that patient told her that she did not like DR. Crenshaw and she chooses not to f/u with Dr. Audery Blazing and wanting to only follow up with Dr. Greer Leak.

## 2023-05-06 NOTE — Patient Outreach (Signed)
 TOC RN received call from Burdette Carolin at [facility] office regarding the message received previously from Dr Greer Leak and [facility] RN advised Burdette Carolin that patient was advised to complete her part of the Farxiga  paperwork and take to PCP office. Patient was encouraged to call the 800 number given to her by this RN for patient assistance for Eliquis  and Entresto . Patient advised that MD said she can take the fish oil and the pravastatin, and that MD said okay for BP to be a little lower with HF but that she can take 1/2 tab of Lasix  or skip for just one day if SBP<100. Patient verbalized understanding and states she will work on the paperwork and calling the 800 number. TOC RN also made Kim aware that patient has since reported she has the Entresto  and Farxigo through a [employer] and has been able to pick up the Eliquis . TOC RN and Burdette Carolin also discussed TOC  RN's prior question regarding Pravastatin that was on d/c summary but not in Epic and Burdette Carolin will see if MD wants patient taking this med and will advise that it will need to be called in to pharmacy, and that Spironlactone was in [facility] but not on d/c summary and patient is taking. TOC RN also advised that [facility] RN program was closed today and patient has been referred to CCM.  [name] RN, CCM [place]  VBCI-Population Health RN Care Manager [phone]

## 2023-05-06 NOTE — Transitions of Care (Post Inpatient/ED Visit) (Signed)
 Transition of Care  W eek 5 TOC Closure  Visit Note  05/06/2023  Name: Briana Silva MRN: [id number]          DOB: 06/20/1945  Situation: Patient enrolled in [facility] 30-day program. Visit completed with patient by telephone.   Background: Patient discharged 04/05/23 from [place] related to CHF. Patient is weighing daily and reports 200.7lbs today. Patient continues to monitor and record BP and reports 114/66 today. Patient has PCP follow up 05/25/23 and states she is awaiting results of recent CT scan and answer to question she sent via portal regarding vaccines prior to her scheduled trip to [place] 06/26/23. Patient agreeable to [facility] closure with transfer to CCM   Initial Transition Care Management Follow-up Telephone Call    Past Medical History:  Diagnosis Date   Abnormal echocardiogram 09/18/2016   Mild LVH, EF 55-65%, moderately dilated LA and RA   Allergy    Asthma    Asthma    Atrial tachycardia (HCC)    Cardiomyopathy (HCC)    CHF (congestive heart failure) (HCC)    Depression    Hyperlipidemia    Hypertension    Obesity     Assessment: Patient Reported Symptoms: Cognitive        Neurological      HEENT        Cardiovascular   Weight: 220 lb 11.2 oz (100.1 kg)  Respiratory Respiratory Symptoms Reported: No symptoms reported Respiratory Conditions: Asthma Respiratory Self-Management Outcome: 4 (good) Respiratory Comment: Patient states her asthma management is harder with high pollen - keeps rescue inhaler on hand  Endocrine      Gastrointestinal        Genitourinary      Integumentary      Musculoskeletal          Psychosocial           Vitals:   05/06/23 1029  BP: 114/66  Pulse: 69    Medications Reviewed Today     Reviewed by Sharmaine Dearth, RN (Registered Nurse) on 05/06/23 at 1024  Med List Status: <None>   Medication Order Taking? Sig Documenting Provider Last Dose Status Informant  albuterol  (PROAIR  HFA) 108 (90 Base)  MCG/ACT inhaler [id number] Yes Inhale 2 puffs into the lungs every 4 (four) hours as needed for wheezing or shortness of breath. Cydney Draft, MD Taking Active   apixaban  (ELIQUIS ) 5 MG TABS tablet [id number] Yes Take 1 tablet (5 mg total) by mouth 2 (two) times daily. Lenise Quince, MD Taking Active            Med Note Finley Hugh, CHERYL A   Fri Apr 10, 2023 10:19 AM)    carvedilol  (COREG ) 12.5 MG tablet [id number] Yes Take 1 tablet (12.5 mg total) by mouth 2 (two) times daily with a meal. Audery Blazing Deannie Fabian, MD Taking Active   cetirizine  (ZYRTEC ) 10 MG tablet [id number] Yes Take 1 tablet (10 mg total) by mouth daily as needed for allergies or rhinitis. Cydney Draft, MD Taking Active   Cholecalciferol (QC VITAMIN D3) 50 MCG (2000 UT) TABS [id number] Yes Take 1 tablet by mouth daily. [provider] Taking Active   dapagliflozin  propanediol (FARXIGA ) 10 MG TABS tablet [id number] Yes Take 1 tablet (10 mg total) by mouth daily. Patient needs financial assist - referring to pharmacist Cydney Draft, MD Taking Active            Med Note Finley Hugh, [facility] A   Fri Apr 10, 2023 10:19 AM) [employer] grant covers Medicare copay  digoxin  (LANOXIN ) 0.125 MG tablet [id number] Yes Take 1 tablet by mouth once daily Lenise Quince, MD Taking Active   ferrous sulfate 324 MG TBEC [id number] Yes Take 324 mg by mouth. [provider] Taking Active            Med Note Bernadene Brewer, Donnajean Chesnut A   Tue Apr 14, 2023 11:09 AM) Patient states she has this med now and is taking  fluticasone -salmeterol (ADVAIR) 250-50 MCG/ACT AEPB [id number] Yes INHALE 1 DOSE BY MOUTH INTO THE LUNGS TWICE DAILY Cydney Draft, MD Taking Active   furosemide  (LASIX ) 20 MG tablet [id number] Yes Take 1 tablet (20 mg total) by mouth daily.  Patient taking differently: Take 40 mg by mouth daily. Dose increased to 40mg  with 04/05/23 hospital dc   Cydney Draft, MD Taking Active   Omega-3 Fatty Acids (FISH  OIL) 1000 MG CAPS [id number] Yes Take 1 capsule by mouth daily. [provider] Taking Active   Prenatal Vit-Fe Fumarate-FA (PRENATAL MULTIVITAMIN) TABS tablet [id number] Yes Take 1 tablet by mouth daily at 12 noon. [provider] Taking Active   sacubitril -valsartan  (ENTRESTO ) 24-26 MG [id number] Yes Take 1 tablet by mouth 2 (two) times daily. Cydney Draft, MD Taking Active            Med Note Finley Hugh, CHERYL A   Fri Apr 10, 2023 10:19 AM) [employer] grant covers Medicare copay  spironolactone  (ALDACTONE ) 25 MG tablet [id number] Yes Take 1 tablet by mouth once daily Crenshaw, Deannie Fabian, MD Taking Active            Med Note Finley Hugh, CHERYL A   Fri Apr 10, 2023 10:20 AM)              Recommendation:   Referred to CCM  Follow Up Plan:   Referral to RN Case Manager  [name] RN, CCM [place]  VBCI-Population Health RN Care Manager [phone]

## 2023-05-06 NOTE — Telephone Encounter (Signed)
 Called [name] , RN left detailed voice mail message on confidential voice mail.  Requesting a call back to confirm that she did receive the message.

## 2023-05-08 ENCOUNTER — Other Ambulatory Visit: Payer: Self-pay | Admitting: Family Medicine

## 2023-05-08 DIAGNOSIS — E041 Nontoxic single thyroid nodule: Secondary | ICD-10-CM

## 2023-05-08 MED ORDER — PRAVASTATIN SODIUM 40 MG PO TABS: 40.0000 mg | ORAL_TABLET | Freq: Every day | ORAL | 3 refills | Status: AC

## 2023-05-08 MED ORDER — SPIRONOLACTONE 25 MG PO TABS: 25.0000 mg | ORAL_TABLET | Freq: Every day | ORAL | 1 refills | Status: AC

## 2023-05-08 NOTE — Telephone Encounter (Signed)
 Yes please have her take the spironolactone  and the pravastatin. I will send refill for prava

## 2023-05-08 NOTE — Telephone Encounter (Signed)
 Yellow fever is recommended for most of [place].  But if she wants I can just double check that it is recommended for the area that she is going to.  If she would like to get it and I would encourage her to do so, she will need to have that done at the health department that is one of the few vaccines that we do not carry here because we do not give it very often.

## 2023-05-08 NOTE — Progress Notes (Signed)
 Orders Placed This Encounter  Procedures   US  THYROID     Standing Status:   Future    Expiration Date:   05/07/2024    Reason for Exam (SYMPTOM  OR DIAGNOSIS REQUIRED):   thyroid  nodule seen on CT    Preferred imaging location?:   [facility]

## 2023-05-08 NOTE — Telephone Encounter (Signed)
 Patient advised.

## 2023-05-08 NOTE — Telephone Encounter (Signed)
 Briana Silva states she is traveling to [place]. She wanted to know if Dr Greer Leak recommends getting the Yellow Fever Vaccine.

## 2023-05-08 NOTE — Addendum Note (Signed)
 Addended by: Ilyana Manuele D on: 05/08/2023 07:46 AM   Modules accepted: Orders

## 2023-05-12 ENCOUNTER — Ambulatory Visit

## 2023-05-12 DIAGNOSIS — E041 Nontoxic single thyroid nodule: Secondary | ICD-10-CM

## 2023-05-12 DIAGNOSIS — E042 Nontoxic multinodular goiter: Secondary | ICD-10-CM | POA: Diagnosis not present

## 2023-05-15 ENCOUNTER — Other Ambulatory Visit: Payer: Self-pay

## 2023-05-15 NOTE — Patient Instructions (Signed)
 Visit Information  Thank you for taking time to visit with me today. Please don't hesitate to contact me if I can be of assistance to you before our next scheduled telephone appointment.  Our next appointment is by telephone on May 30 at 2pm.  Following is a copy of your care plan:   Goals Addressed             This Visit's Progress    VBCI RN Care Plan       Problems:  Chronic Disease Management support and education needs related to Atrial Fibrillation and CHF  Goal: Over the next 3 months the Patient will continue to work with RN Care Manager and/or Social Worker to address care management and care coordination needs related to Atrial Fibrillation and CHF as evidenced by adherence to care management team scheduled appointments      Interventions:   AFIB Interventions:   Reviewed importance of adherence to anticoagulant exactly as prescribed Counseled on bleeding risk associated with Eliquis  and importance of self-monitoring for signs/symptoms of bleeding Counseled on avoidance of NSAIDs due to increased bleeding risk with anticoagulants Counseled on seeking medical attention after a head injury or if there is blood in the urine/stool Afib action plan reviewed Assessed social determinant of health barriers   Heart Failure Interventions: Basic overview and discussion of pathophysiology of Heart Failure reviewed Provided education on low sodium diet Reviewed Heart Failure Action Plan in depth and provided written copy Assessed need for readable accurate scales in home Advised patient to weigh each morning after emptying bladder Discussed importance of daily weight and advised patient to weigh and record daily Reviewed role of diuretics in prevention of fluid overload and management of heart failure; Discussed the importance of keeping all appointments with provider  Patient Self-Care Activities:  Attend all scheduled provider appointments Call pharmacy for medication  refills 3-7 days in advance of running out of medications Call provider office for new concerns or questions  Perform all self care activities independently  Perform IADL's (shopping, preparing meals, housekeeping, managing finances) independently Take medications as prescribed   Work with the pharmacist to address medication management needs and will continue to work with the clinical team to address health care and disease management related needs call office if I gain more than 2 pounds in one day or 5 pounds in one week track weight in diary watch for swelling in feet, ankles and legs every day weigh myself daily develop a rescue plan follow rescue plan if symptoms flare-up begin a symptom diary bring symptom diary to all appointments check pulse (heart) rate once a day take medicine as prescribed  Plan:  The patient has been provided with contact information for the care management team and has been advised to call with any health related questions or concerns.              Patient verbalizes understanding of instructions and care plan provided today and agrees to view in MyChart. Active MyChart status and patient understanding of how to access instructions and care plan via MyChart confirmed with patient.     The patient has been provided with contact information for the care management team and has been advised to call with any health related questions or concerns.   Please call the care guide team at [phone] if you need to cancel or reschedule your appointment.   Please call 1-800-273-TALK (toll free, 24 hour hotline) if you are experiencing a Mental Health or Behavioral Health  Crisis or need someone to talk to.  Cattie Tineo A. Saverio Curling RN, BA, [facility], CRRN [place]  [facility] Population Health RN Care Manager Direct Dial: [phone]  Fax: [phone]

## 2023-05-15 NOTE — Patient Outreach (Signed)
 Complex Care Management   Visit Note  05/15/2023  Name:  Briana Silva MRN: [id number] DOB: 05-15-1945  Situation: Referral received for Complex Care Management related to Persistent Atrial Fibrillation (rate controlled) and Chronic combined systolic and diastolic Congestive Heart Failure I obtained verbal consent from Patient.  Visit completed with patient & RNCM  on the phone  Background:   Past Medical History:  Diagnosis Date   Abnormal echocardiogram 09/18/2016   Mild LVH, EF 55-65%, moderately dilated LA and RA   Allergy    Asthma    Asthma    Atrial tachycardia (HCC)    Cardiomyopathy (HCC)    CHF (congestive heart failure) (HCC)    Depression    Hyperlipidemia    Hypertension    Obesity     Assessment: Patient Reported Symptoms:  Cognitive Cognitive Status: Alert and oriented to person, place, and time, Insightful and able to interpret abstract concepts, Normal speech and language skills   Health Facilitated by: Healthy diet  Neurological Neurological Review of Symptoms: No symptoms reported    HEENT HEENT Symptoms Reported: Change or loss of hearing, Other: (also suffers from extrinsic asthma & allergic rhinitis triggered by high pollen count - keeps rescue inhaler on hand) Other HEENT Symptoms/Conditions: wears corrective lenses and states she has experienced some hearing loss to her Left ear HEENT Conditions: Ear problem(s), Vision problem(s) Vision Problems:  (wears corrective lenses) HEENT Management Strategies: Medication therapy HEENT Comment: Patient has adapted/compensated for L ear hearing loss and has no issues wearing glasses for better eyesight Ear problem(s), Vision problem(s)  Cardiovascular Cardiovascular Symptoms Reported: No symptoms reported Cardiovascular Conditions: Dysrhythmia, Heart failure Cardiovascular Management Strategies: Medication therapy, Coping strategies, Diet modification, Adequate rest, Fluid modification, Routine screening,  Weight management Do You Have a Working Readable Scale?: Yes Weight: 220 lb (99.8 kg) Cardiovascular Self-Management Outcome: 4 (good)  Respiratory Respiratory Symptoms Reported: No symptoms reported Other Respiratory Symptoms: Has extrinsic asthma Respiratory Conditions: Asthma, Seasonal allergies Respiratory Self-Management Outcome: 4 (good) Respiratory Comment: Patient's asthma management more challenging with high pollen count - keeps rescue inhaler on hand to use as needed  Endocrine Patient reports the following symptoms related to hypoglycemia or hyperglycemia : No symptoms reported Is patient diabetic?: No Endocrine Conditions:  (prediabetes) Other Endocrine Conditions: prediabetes; 04/02/23 A1c = 6.2 Endocrine Management Strategies: Diet modification Endocrine Self-Management Outcome: 4 (good)  Gastrointestinal Gastrointestinal Symptoms Reported: No symptoms reported   Nutrition Risk Screen (CP): No indicators present  Genitourinary Genitourinary Symptoms Reported: No symptoms reported    Integumentary Integumentary Symptoms Reported: No symptoms reported    Musculoskeletal Musculoskelatal Symptoms Reviewed: Unsteady gait Additional Musculoskeletal Details: uses cane/walker for better balance especially when walking on uneven pavement Musculoskeletal Conditions: Unsteady gait, Osteopenia Musculoskeletal Management Strategies: Coping strategies, Medical device, Routine screening Musculoskeletal Self-Management Outcome: 4 (good) Musculoskeletal Comment: Has had 1 fall in the past year but fall was minor and did not result in any injury - patient was wearing flip flops and lost her footing - no longer wearing flip flops and vows never to wear again Falls in the past year?: Yes Number of falls in past year: 1 or less Was there an injury with Fall?: No Fall Risk Category Calculator: 1 Patient Fall Risk Level: Low Fall Risk Patient at Risk for Falls Due to: History of fall(s),  Impaired balance/gait Fall risk Follow up: Falls evaluation completed, Education provided, Falls prevention discussed  Psychosocial Psychosocial Symptoms Reported: No symptoms reported   Major Change/Loss/Stressor/Fears (CP): Medical condition,  self Quality of Family Relationships: helpful, supportive, involved Do you feel physically threatened by others?: No      05/06/2023   10:32 AM  Depression screen PHQ 2/9  Decreased Interest 0  Down, Depressed, Hopeless 0  PHQ - 2 Score 0    Vitals:   05/15/23 1457  BP: 110/70    Medications Reviewed Today     Reviewed by Randye Buttner, RN (Registered Nurse) on 05/15/23 at 1501  Med List Status: <None>   Medication Order Taking? Sig Documenting Provider Last Dose Status Informant  albuterol  (PROAIR  HFA) 108 (90 Base) MCG/ACT inhaler [id number] Yes Inhale 2 puffs into the lungs every 4 (four) hours as needed for wheezing or shortness of breath. Cydney Draft, MD Taking Active   apixaban  (ELIQUIS ) 5 MG TABS tablet [id number] Yes Take 1 tablet (5 mg total) by mouth 2 (two) times daily. Lenise Quince, MD Taking Active            Med Note Finley Hugh, Natesha Hassey A   Fri Apr 10, 2023 10:19 AM)    carvedilol  (COREG ) 12.5 MG tablet [id number] Yes Take 1 tablet (12.5 mg total) by mouth 2 (two) times daily with a meal. Audery Blazing Deannie Fabian, MD Taking Active   cetirizine  (ZYRTEC ) 10 MG tablet [id number] Yes Take 1 tablet (10 mg total) by mouth daily as needed for allergies or rhinitis. Cydney Draft, MD Taking Active   Cholecalciferol (QC VITAMIN D3) 50 MCG (2000 UT) TABS [id number] Yes Take 1 tablet by mouth daily. [provider] Taking Active   dapagliflozin  propanediol (FARXIGA ) 10 MG TABS tablet [id number] Yes Take 1 tablet (10 mg total) by mouth daily. Patient needs financial assist - referring to pharmacist Cydney Draft, MD Taking Active            Med Note Finley Hugh, [place] A   Fri Apr 10, 2023 10:19 AM) [employer] grant  covers Medicare copay  digoxin  (LANOXIN ) 0.125 MG tablet [id number] Yes Take 1 tablet by mouth once daily Lenise Quince, MD Taking Active   ferrous sulfate 324 MG TBEC [id number] Yes Take 324 mg by mouth. [provider] Taking Active            Med Note Bernadene Brewer, RHONDA A   Tue Apr 14, 2023 11:09 AM) Patient states she has this med now and is taking  fluticasone -salmeterol (ADVAIR) 250-50 MCG/ACT AEPB [id number] Yes INHALE 1 DOSE BY MOUTH INTO THE LUNGS TWICE DAILY Cydney Draft, MD Taking Active   furosemide  (LASIX ) 20 MG tablet [id number] Yes Take 1 tablet (20 mg total) by mouth daily.  Patient taking differently: Take 40 mg by mouth daily. Dose increased to 40mg  with 04/05/23 hospital dc   Cydney Draft, MD Taking Active   Omega-3 Fatty Acids (FISH OIL) 1000 MG CAPS [id number] Yes Take 1 capsule by mouth daily. [provider] Taking Active   pravastatin  (PRAVACHOL ) 40 MG tablet [id number] Yes Take 1 tablet (40 mg total) by mouth at bedtime. Cydney Draft, MD Taking Active   Prenatal Vit-Fe Fumarate-FA (PRENATAL MULTIVITAMIN) TABS tablet [id number] Yes Take 1 tablet by mouth daily at 12 noon. [provider] Taking Active   sacubitril -valsartan  (ENTRESTO ) 24-26 MG [id number] Yes Take 1 tablet by mouth 2 (two) times daily. Cydney Draft, MD Taking Active            Med Note Finley Hugh, [place] A   Fri Apr 10, 2023 10:19 AM) [employer] grant covers Medicare copay  spironolactone  (ALDACTONE ) 25 MG tablet [id number] Yes Take 1 tablet (25 mg total) by mouth daily. Cydney Draft, MD Taking Active             Recommendation:   PCP Follow-up on 05/25/23 @11 :10am  Follow Up Plan:   Telephone follow up appointment date/time:  06/12/2023 @ 2pm  Bartholomew Light A. Saverio Curling RN, BA, [facility], CRRN [place]  [facility] Population Health RN Care Manager Direct Dial: [phone]  Fax: [phone]

## 2023-05-18 NOTE — Progress Notes (Signed)
 Call patient: Ultrasound of the thyroid  gland shows enlarged multinodular thyroid  gland.  There is a nodule on the right side measuring 2.8 cm that they recommend that we do a needle biopsy for.  He also have a nodule that is more towards the middle approximately 1.3 cm that they also recommend a repeat ultrasound in 1 year.  If you are okay with moving forward with the biopsy please let me know but I would recommend that we do so.

## 2023-05-20 ENCOUNTER — Other Ambulatory Visit: Payer: Self-pay | Admitting: *Deleted

## 2023-05-20 DIAGNOSIS — E041 Nontoxic single thyroid nodule: Secondary | ICD-10-CM

## 2023-05-20 NOTE — Progress Notes (Signed)
Order pended for review and signature.

## 2023-05-21 NOTE — Progress Notes (Signed)
 Orders Placed This Encounter  Procedures   US  FNA BIOPSY THYROID  EA ADD LESION AFIRMA    Standing Status:   Future    Expiration Date:   05/19/2024    Reason for Exam (SYMPTOM  OR DIAGNOSIS REQUIRED):   Aaron Aas Nodule # 1 in the right mid to lower gland is a 2.8 cm TI-RADS category 3 nodule and meets criteria to consider fine-needle aspiration biopsy. Biopsy is recommended    Preferred location?:   [facility]     Call pt going to [place]  Vaccine guidelines would recommend that she be up-to-date with her Tdap, MMR, shingles, flu and polio.  Does she know if she is up-to-date on these vaccines?  If she is not we can always do titers to make sure.  She is actually due for Tdap the summer so I would recommend she go ahead and get a booster on that once free with her Medicare if she gets it done at the pharmacy  Would also recommend that she be updated on her hepatitis A and hepatitis B.  They do make a combination hep A and hep B called Twinrix which we do have available here and she can get it done here with her insurance for travel.  They do recommend yellow fever for all travelers but she will have to get that done at the local health department.  And they do recommend typhoid if staying with family members or staying in very rural areas

## 2023-05-25 ENCOUNTER — Ambulatory Visit: Payer: Self-pay

## 2023-05-25 ENCOUNTER — Encounter: Payer: Self-pay | Admitting: Family Medicine

## 2023-05-25 ENCOUNTER — Telehealth: Payer: Self-pay | Admitting: Family Medicine

## 2023-05-25 ENCOUNTER — Ambulatory Visit (INDEPENDENT_AMBULATORY_CARE_PROVIDER_SITE_OTHER): Admitting: Family Medicine

## 2023-05-25 VITALS — BP 125/75 | HR 70 | Temp 98.6°F | Ht 66.0 in | Wt 222.0 lb

## 2023-05-25 DIAGNOSIS — Z7184 Encounter for health counseling related to travel: Secondary | ICD-10-CM | POA: Diagnosis not present

## 2023-05-25 DIAGNOSIS — Z2989 Encounter for other specified prophylactic measures: Secondary | ICD-10-CM

## 2023-05-25 DIAGNOSIS — Z23 Encounter for immunization: Secondary | ICD-10-CM

## 2023-05-25 DIAGNOSIS — J4531 Mild persistent asthma with (acute) exacerbation: Secondary | ICD-10-CM

## 2023-05-25 DIAGNOSIS — I5032 Chronic diastolic (congestive) heart failure: Secondary | ICD-10-CM

## 2023-05-25 DIAGNOSIS — I5042 Chronic combined systolic (congestive) and diastolic (congestive) heart failure: Secondary | ICD-10-CM

## 2023-05-25 DIAGNOSIS — I10 Essential (primary) hypertension: Secondary | ICD-10-CM | POA: Diagnosis not present

## 2023-05-25 MED ORDER — ALBUTEROL SULFATE HFA 108 (90 BASE) MCG/ACT IN AERS: 2.0000 | INHALATION_SPRAY | RESPIRATORY_TRACT | 5 refills | Status: AC | PRN

## 2023-05-25 MED ORDER — DAPAGLIFLOZIN PROPANEDIOL 10 MG PO TABS: 10.0000 mg | ORAL_TABLET | Freq: Every day | ORAL | 3 refills | Status: AC

## 2023-05-25 NOTE — Progress Notes (Signed)
 Established Patient Office Visit  Subjective  Patient ID: Briana Silva, female    DOB: 1945/02/06  Age: 78 y.o. MRN: [id number]  Chief Complaint  Patient presents with   Medical Management of Chronic Issues    Chronic diastolic congestive heart failure (HCC)      Vaccine guidelines would recommend that she be up-to-date with her Tdap, MMR, shingles, flu and polio.  Does she know if she is up-to-date on these vaccines?  If she is not we can always do titers to make sure.  She is actually due for Tdap the summer so I would recommend she go ahead and get a booster on that once free with her Medicare if she gets it done at the pharmacy   Would also recommend that she be updated on her hepatitis A and hepatitis B.  They do make a combination hep A and hep B called Twinrix which we do have available here and she can get it done here with her insurance for travel.  HPI    ROS    Objective:     BP 125/75   Pulse 70   Temp 98.6 F (37 C) (Oral)   Ht 5\' 6"  (1.676 m)   Wt 222 lb (100.7 kg)   SpO2 99%   BMI 35.83 kg/m    Physical Exam Vitals and nursing note reviewed.  Constitutional:      Appearance: Normal appearance.  HENT:     Head: Normocephalic and atraumatic.  Eyes:     Conjunctiva/sclera: Conjunctivae normal.  Cardiovascular:     Rate and Rhythm: Normal rate. Rhythm irregular.  Pulmonary:     Effort: Pulmonary effort is normal.     Breath sounds: Normal breath sounds.  Skin:    General: Skin is warm and dry.  Neurological:     Mental Status: She is alert.  Psychiatric:        Mood and Affect: Mood normal.      No results found for any visits on 05/25/23.    The 10-year ASCVD risk score (Arnett DK, et al., 2019) is: 17.5%    Assessment & Plan:   Problem List Items Addressed This Visit       Cardiovascular and Mediastinum   Essential hypertension - Primary   Blood pressure looks fantastic today.  She has not noticed any swelling.       Relevant Medications   furosemide  (LASIX ) 40 MG tablet   Congestive heart failure (HCC)   No sign of volume overload.  The Farxiga  cost for the full $47.  She supposed to have a grantto cover her co-pay so we may need to call the pharmacy and figure out what is going on we do need to send an updated 90-day supply prescription anyway.  Continue Entresto , spironolactone , furosemide , Farxiga , carvedilol .      Relevant Medications   dapagliflozin  propanediol (FARXIGA ) 10 MG TABS tablet   furosemide  (LASIX ) 40 MG tablet   Other Visit Diagnoses       Chronic combined systolic and diastolic CHF (congestive heart failure) (HCC)       Relevant Medications   dapagliflozin  propanediol (FARXIGA ) 10 MG TABS tablet   furosemide  (LASIX ) 40 MG tablet     Travel advice encounter         Mild persistent asthma with acute exacerbation       Relevant Medications   albuterol  (PROAIR  HFA) 108 (90 Base) MCG/ACT inhaler     Need for hepatitis A immunization  Relevant Orders   Hepatitis A hepatitis B combined vaccine IM (Completed)       Travel advice-she really does not want to have the yellow fever vaccine especially for people who are entering the country.  She said she is pretty much going to be staying in an apartment and may be taking some chores but [place] really not can to be in real areas and plans on being careful and not really getting out all that much.  Given first Twinrix injection today she will not be able to get the 1 before she leaves.\\  Return in about 1 week (around 06/01/2023) for 2nd twinrix vaccine - nurse visit.    Duaine German, MD

## 2023-05-25 NOTE — Assessment & Plan Note (Addendum)
 No sign of volume overload.  The Farxiga  cost for the full $47.  She supposed to have a grantto cover her co-pay so we may need to call the pharmacy and figure out what is going on we do need to send an updated 90-day supply prescription anyway.  Continue Entresto , spironolactone , furosemide , Farxiga , carvedilol .

## 2023-05-25 NOTE — Telephone Encounter (Signed)
 Bartholomew Light, I know you have worked hard to get Briana Silva coverage for her co-pays with a grant.  But when she went to pick up her Farxiga  at the end of last week they still charged her $47 even though she mention to them that there was supposed to be a grant to cover it she did go ahead and pick up a 30-day supply but I was hoping we could check into this to see what might be going on.  A new prescription today and put a note on it saying that she has a grant that should be covering it.

## 2023-05-25 NOTE — Progress Notes (Unsigned)
 Receive a call back from patient.  Collaborate with [facility] on behalf of patient. Speak with Pharmacy Technician Crystal. Request rebill patient's latest Farxiga  prescription to go through her [employer] grant (in addition to her [employer] coverage). Rebill completed. Follow up with patient to advise her to go to [facility] in order to receive her refund for $47 copayment that she previously paid.  Provide patient with contact information for [facility] in case needed in the future.  Arthur Lash, PharmD, [facility] [phone]

## 2023-05-25 NOTE — Assessment & Plan Note (Signed)
 Blood pressure looks fantastic today.  She has not noticed any swelling.

## 2023-05-25 NOTE — Telephone Encounter (Signed)
 Covering for Pharmacist Keane Passe today. Was unable to reach patient via telephone today and have left HIPAA compliant voicemail asking patient to return my call.   Arthur Lash, PharmD, [facility] [phone]

## 2023-05-25 NOTE — Patient Instructions (Addendum)
 Come back in 7 days to have your second Twinrix done. Then you will return in 30 days from today for your third dose.

## 2023-05-25 NOTE — Telephone Encounter (Signed)
 Summary: vaccine question   Copied From CRM [id number]. Reason for Triage: Please call Pt. [place] regarding todays vaccine, she would like to talk about what she received today. She was unsure.  Please advise           Chief Complaint: general information  Additional Notes: Pt called to asked what vaccine she rec'd todaay. Advised patient that she should have rec'd the Hep A/B vaccine. Pt now asking if she needs the Malaria vaccine as she is going out of the country soon. If she does need she is requesting that it be ordered for her 5/19 visit. Pt also asking if she can have list of all the vaccines she has rec'd.   Reason for Disposition  General information question, no triage required and triager able to answer question  Answer Assessment - Initial Assessment Questions 1. REASON FOR CALL or QUESTION: "What is your reason for calling today?" or "How can I best help you?" or "What question do you have that I can help answer?"     Patient wanting to know what vaccine she rec'd today  Protocols used: Information Only Call - No Triage-A-AH

## 2023-05-28 ENCOUNTER — Other Ambulatory Visit: Payer: Self-pay

## 2023-05-28 MED ORDER — DOXYCYCLINE HYCLATE 100 MG PO TABS: 100.0000 mg | ORAL_TABLET | Freq: Every day | ORAL | 0 refills | Status: DC

## 2023-05-28 NOTE — Progress Notes (Signed)
   05/28/2023  Patient ID: Briana Silva, female   DOB: 1945/09/11, 78 y.o.   MRN: [id number]  Patient outreach to follow-up regarding Farxiga  prescription that patient picked up and paid copay for recently.  She has not yet gone to [employer] for refund where [employer] was not originally billed but plans to this week.  [employer] information provided to patient, and I recommended requesting pharmacy look at how Farxiga  and Entresto  were previously billed if they state copay due in the future.  Patient also knows to contact directly if needed.  Linn Rich, PharmD, DPLA

## 2023-05-28 NOTE — Telephone Encounter (Signed)
 Patient informed.  She states she was told that the flu season is different where she is going and is just starting now. She is up to date with the flu vaccine as far as this season in [place] -  should she get an additional vaccine for the flu when she goes to the pharmacy for the shingles vaccine to be given.

## 2023-05-28 NOTE — Addendum Note (Signed)
 Addended by: Bryley Chrisman D on: 05/28/2023 07:45 AM   Modules accepted: Orders

## 2023-05-28 NOTE — Telephone Encounter (Signed)
 They do recommend malaria prophylaxis with doxycycline .  I will send over a Rx.    They also recommend considering typhoid vaccine for most travelers if they are staying with relatives visiting in smaller or [place].  [place] I think the risk for typhoid is very low just be careful about eating undercooked foods.  In regards to the yellow fever it is technically recommended by the CDC but if your potential for exposure is relatively low then you can opt not to do it based on your age and immune status.  I know you said you were going to kind of keep to being around a lot of other people to a minimum.    Meds ordered this encounter  Medications   DISCONTD: doxycycline  (VIBRA -TABS) 100 MG tablet    Sig: Take 1 tablet (100 mg total) by mouth daily. Start 2 days before arrival in the country and D/C 4 weeks after exposure    Dispense:  60 tablet    Refill:  0   doxycycline  (VIBRA -TABS) 100 MG tablet    Sig: Take 1 tablet (100 mg total) by mouth daily. Start 2 days before arrival in the country and D/C 4 weeks after exposure    Dispense:  60 tablet    Refill:  0

## 2023-05-28 NOTE — Telephone Encounter (Signed)
 Sorry should say [place] cities below.    Yes can get up to date flu shot before she travels. It will be same one as in fall since new one doesn't come out until August.

## 2023-05-29 NOTE — Telephone Encounter (Signed)
 Patient informed.

## 2023-05-30 ENCOUNTER — Other Ambulatory Visit: Payer: Self-pay | Admitting: Cardiology

## 2023-06-01 ENCOUNTER — Ambulatory Visit (INDEPENDENT_AMBULATORY_CARE_PROVIDER_SITE_OTHER)

## 2023-06-01 VITALS — Temp 97.9°F

## 2023-06-01 DIAGNOSIS — Z23 Encounter for immunization: Secondary | ICD-10-CM | POA: Diagnosis not present

## 2023-06-01 NOTE — Patient Instructions (Signed)
 Return in 21-30 days for next Twinrix injection.

## 2023-06-01 NOTE — Progress Notes (Signed)
   Established Patient Office Visit  Subjective   Patient ID: Briana Silva, female    DOB: 04/07/1945  Age: 78 y.o. MRN: [id number]  Chief Complaint  Patient presents with   Immunizations    Fast track Twinrix admin.     HPI  Fast track Twinrix admin due to patient traveling.   ROS    Objective:     Temp 97.9 F (36.6 C)    Physical Exam   No results found for any visits on 06/01/23.    The 10-year ASCVD risk score (Arnett DK, et al., 2019) is: 17.5%    Assessment & Plan:  Admin Twinrix IM right deltoid . Patient tolerated injection well but did mention some discomfort during injection. Return in 21-30 days for next injection of Twinrix.  Problem List Items Addressed This Visit   None Visit Diagnoses       Need for vaccination with Twinrix    -  Primary   Relevant Orders   Hepatitis A hepatitis B combined vaccine IM (Completed)       Return for Return in 21-30 days for next Twinrix injection. Dickie Found, LPN

## 2023-06-03 ENCOUNTER — Other Ambulatory Visit: Payer: Self-pay | Admitting: Family Medicine

## 2023-06-03 DIAGNOSIS — I5032 Chronic diastolic (congestive) heart failure: Secondary | ICD-10-CM

## 2023-06-03 DIAGNOSIS — I5042 Chronic combined systolic (congestive) and diastolic (congestive) heart failure: Secondary | ICD-10-CM

## 2023-06-12 ENCOUNTER — Other Ambulatory Visit: Payer: Self-pay

## 2023-06-12 NOTE — Patient Instructions (Signed)
 Visit Information  Thank you for taking time to visit with me today. Please don't hesitate to contact me if I can be of assistance to you before our next scheduled telephone appointment.  Our next appointment is by telephone on 07/14/23 at 2pm  Following is a copy of your care plan:   Goals Addressed             This Visit's Progress    VBCI RN Care Plan       Problems:  Chronic Disease Management support and education needs related to Atrial Fibrillation and CHF  Goal: Over the next 4 months the Patient will continue to work with RN Care Manager and/or Social Worker to address care management and care coordination needs related to Atrial Fibrillation and CHF as evidenced by adherence to care management team scheduled appointments      Interventions:   AFIB Interventions:   Reviewed importance of adherence to anticoagulant exactly as prescribed Counseled on bleeding risk associated with Eliquis  and importance of self-monitoring for signs/symptoms of bleeding Counseled on avoidance of NSAIDs due to increased bleeding risk with anticoagulants Counseled on seeking medical attention after a head injury or if there is blood in the urine/stool Afib action plan reviewed Assessed social determinant of health barriers   Heart Failure Interventions: Basic overview and discussion of pathophysiology of Heart Failure reviewed Provided education on low sodium diet Reviewed Heart Failure Action Plan in depth and provided written copy Assessed need for readable accurate scales in home Advised patient to weigh each morning after emptying bladder Discussed importance of daily weight and advised patient to weigh and record daily Reviewed role of diuretics in prevention of fluid overload and management of heart failure; Discussed the importance of keeping all appointments with provider  Patient Self-Care Activities:  Attend all scheduled provider appointments Call pharmacy for medication  refills 3-7 days in advance of running out of medications Call provider office for new concerns or questions  Perform all self care activities independently  Perform IADL's (shopping, preparing meals, housekeeping, managing finances) independently Take medications as prescribed   Work with the pharmacist to address medication management needs and will continue to work with the clinical team to address health care and disease management related needs call office if I gain more than 2 pounds in one day or 5 pounds in one week track weight in diary watch for swelling in feet, ankles and legs every day weigh myself daily develop a rescue plan follow rescue plan if symptoms flare-up begin a symptom diary bring symptom diary to all appointments check pulse (heart) rate once a day take medicine as prescribed  Plan:  The patient has been provided with contact information for the care management team and has been advised to call with any health related questions or concerns.  Next appointment with RNCM scheduled for 07/14/23 @ 2pm after patient returns from her trip to [place].             Patient verbalizes understanding of instructions and care plan provided today and agrees to view in MyChart. Active MyChart status and patient understanding of how to access instructions and care plan via MyChart confirmed with patient.     The patient has been provided with contact information for the care management team and has been advised to call with any health related questions or concerns.   Please call the care guide team at [phone] if you need to cancel or reschedule your appointment.   Please call  1-800-273-TALK (toll free, 24 hour hotline) if you are experiencing a Mental Health or Behavioral Health Crisis or need someone to talk to.  Inari Shin A. Saverio Curling RN, BA, [facility], CRRN [place]  [facility] Population Health RN Care Manager Direct Dial: [phone]  Fax: [phone]

## 2023-06-12 NOTE — Patient Outreach (Signed)
 Complex Care Management   Visit Note  06/12/2023  Name:  Briana Silva MRN: [id number] DOB: 10-25-1945  Situation: Referral received for Complex Care Management related to Heart Failure and Atrial Fibrillation I obtained verbal consent from Patient.  Visit completed with Patient & RNCM  on the phone  Background:   Past Medical History:  Diagnosis Date   Abnormal echocardiogram 09/18/2016   Mild LVH, EF 55-65%, moderately dilated LA and RA   Allergy    Asthma    Asthma    Atrial tachycardia (HCC)    Cardiomyopathy (HCC)    CHF (congestive heart failure) (HCC)    Depression    Hyperlipidemia    Hypertension    Obesity     Assessment: Patient Reported Symptoms:  Cognitive Cognitive Status: Alert and oriented to person, place, and time, Normal speech and language skills, Insightful and able to interpret abstract concepts   Health Maintenance Behaviors: Annual physical exam, Social activities, Healthy diet, Immunizations Health Facilitated by: Healthy diet  Neurological Neurological Review of Symptoms: No symptoms reported    HEENT HEENT Symptoms Reported: Change or loss of hearing Other HEENT Symptoms/Conditions: wears corrective lenses and 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 HEENT Conditions: Ear problem(s), Vision problem(s) Vision Problems:  (wears corrective lenses) HEENT Management Strategies: Medication therapy Ear problem(s), Vision problem(s)  Cardiovascular Cardiovascular Symptoms Reported: No symptoms reported Does patient have uncontrolled Hypertension?: No Cardiovascular Conditions: Heart failure, Dysrhythmia Cardiovascular Management Strategies: Medication therapy, Diet modification, Coping strategies, Adequate rest, Fluid modification, Weight management, Routine screening Do You Have a Working Readable Scale?: Yes Cardiovascular Self-Management Outcome: 4 (good)  Respiratory Respiratory Symptoms Reported: No symptoms reported Other Respiratory  Symptoms: has extrinsic asthma Respiratory Conditions: Seasonal allergies, Asthma Respiratory Self-Management Outcome: 4 (good)  Endocrine Patient reports the following symptoms related to hypoglycemia or hyperglycemia : No symptoms reported Is patient diabetic?: No Endocrine Conditions:  (prediabetes) Other Endocrine Conditions: prediabetic, 04/02/23 A1c = 6.2 Endocrine Management Strategies: Diet modification Endocrine Self-Management Outcome: 4 (good)  Gastrointestinal Gastrointestinal Symptoms Reported: No symptoms reported   Nutrition Risk Screen (CP): No indicators present  Genitourinary Genitourinary Symptoms Reported: No symptoms reported    Integumentary Integumentary Symptoms Reported: No symptoms reported    Musculoskeletal Musculoskelatal Symptoms Reviewed: Unsteady gait Additional Musculoskeletal Details: uses cane/walker for better balance especially when walking on uneven pavement Musculoskeletal Conditions: Unsteady gait, Osteopenia Musculoskeletal Management Strategies: Coping strategies, Medical device, Routine screening Musculoskeletal Self-Management Outcome: 4 (good) Falls in the past year?: Yes Number of falls in past year: 1 or less Was there an injury with Fall?: No Fall Risk Category Calculator: 1 Patient Fall Risk Level: Low Fall Risk Patient at Risk for Falls Due to: History of fall(s), Medication side effect, Impaired balance/gait Fall risk Follow up: Falls evaluation completed, Education provided, Falls prevention discussed  Psychosocial Psychosocial Symptoms Reported: No symptoms reported   Major Change/Loss/Stressor/Fears (CP): Medical condition, self Quality of Family Relationships: supportive, involved, helpful Do you feel physically threatened by others?: No      05/06/2023   10:32 AM  Depression screen PHQ 2/9  Decreased Interest 0  Down, Depressed, Hopeless 0  PHQ - 2 Score 0    There were no vitals filed for this visit.  Medications  Reviewed Today     Reviewed by Randye Buttner, RN (Registered Nurse) on 06/12/23 at 1747  Med List Status: <None>   Medication Order Taking? Sig Documenting Provider Last Dose Status Informant  albuterol  (PROAIR  HFA) 108 (90  Base) MCG/ACT inhaler [id number] Yes Inhale 2 puffs into the lungs every 4 (four) hours as needed for wheezing or shortness of breath. Cydney Draft, MD Taking Active   apixaban  (ELIQUIS ) 5 MG TABS tablet [id number] Yes Take 1 tablet (5 mg total) by mouth 2 (two) times daily. Lenise Quince, MD Taking Active            Med Note Finley Hugh, Jeniya Flannigan A   Fri Apr 10, 2023 10:19 AM)    carvedilol  (COREG ) 12.5 MG tablet [id number] Yes TAKE 1 TABLET BY MOUTH TWICE DAILY WITH A MEAL Crenshaw, Deannie Fabian, MD Taking Active   cetirizine  (ZYRTEC ) 10 MG tablet [id number] Yes Take 1 tablet (10 mg total) by mouth daily as needed for allergies or rhinitis. Cydney Draft, MD Taking Active   Cholecalciferol (QC VITAMIN D3) 50 MCG (2000 UT) TABS [id number] Yes Take 1 tablet by mouth daily. [provider] Taking Active   dapagliflozin  propanediol (FARXIGA ) 10 MG TABS tablet [id number] Yes Take 1 tablet (10 mg total) by mouth daily. Copay covered by Renelle Carte, MD Taking Active   digoxin  (LANOXIN ) 0.125 MG tablet [id number] Yes Take 1 tablet by mouth once daily Audery Blazing Deannie Fabian, MD Taking Active   doxycycline  (VIBRA -TABS) 100 MG tablet [id number] Yes Take 1 tablet (100 mg total) by mouth daily. Start 2 days before arrival in the country and D/C 4 weeks after exposure Cydney Draft, MD Taking Active   ENTRESTO  24-26 MG [id number] Yes Take 1 tablet by mouth twice daily Cydney Draft, MD Taking Active   ferrous sulfate 324 MG TBEC [id number] Yes Take 324 mg by mouth. [provider] Taking Active            Med Note Bernadene Brewer, RHONDA A   Tue Apr 14, 2023 11:09 AM) Patient states she has this med now and is taking   fluticasone -salmeterol (ADVAIR) 250-50 MCG/ACT AEPB [id number] Yes INHALE 1 DOSE BY MOUTH INTO THE LUNGS TWICE DAILY Cydney Draft, MD Taking Active   furosemide  (LASIX ) 40 MG tablet [id number] Yes Take 40 mg by mouth daily. [provider] Taking Active   Omega-3 Fatty Acids (FISH OIL) 1000 MG CAPS [id number] Yes Take 1 capsule by mouth daily. [provider] Taking Active   pravastatin  (PRAVACHOL ) 40 MG tablet [id number] Yes Take 1 tablet (40 mg total) by mouth at bedtime. Cydney Draft, MD Taking Active   Prenatal Vit-Fe Fumarate-FA (PRENATAL MULTIVITAMIN) TABS tablet [id number] Yes Take 1 tablet by mouth daily at 12 noon. [provider] Taking Active   spironolactone  (ALDACTONE ) 25 MG tablet [id number] Yes Take 1 tablet (25 mg total) by mouth daily. Cydney Draft, MD Taking Active             Recommendation:   PCP Follow-up upon return from trip to [place]  Follow Up Plan:   Telephone follow up appointment date/time:  07/14/23 @ 2pm  Bartholomew Light A. Saverio Curling RN, BA, [facility], CRRN [place]  [facility] Population Health RN Care Manager Direct Dial: [phone]  Fax: [phone]

## 2023-06-22 ENCOUNTER — Other Ambulatory Visit: Payer: Self-pay | Admitting: Family Medicine

## 2023-06-22 DIAGNOSIS — I5023 Acute on chronic systolic (congestive) heart failure: Secondary | ICD-10-CM

## 2023-06-22 DIAGNOSIS — R600 Localized edema: Secondary | ICD-10-CM

## 2023-06-22 MED ORDER — FUROSEMIDE 40 MG PO TABS: 40.0000 mg | ORAL_TABLET | Freq: Every day | ORAL | 1 refills | Status: DC

## 2023-06-22 NOTE — Telephone Encounter (Signed)
 Requesting rx rf of furosemide  40mg   Last written 04/05/2023 by historical provider Last OV 05/25/2023 Upcoming appt 10/07/2023

## 2023-06-22 NOTE — Telephone Encounter (Signed)
 Copied from CRM [id number]. Topic: Clinical - Medication Refill >> Jun 22, 2023  8:10 AM Carrielelia G wrote: Medication: furosemide  (LASIX ) 40 MG tablet  Has the patient contacted their pharmacy? Yes (Agent: If no, request that the patient contact the pharmacy for the refill. If patient does not wish to contact the pharmacy document the reason why and proceed with request.) (Agent: If yes, when and what did the pharmacy advise?)  This is the patient's preferred pharmacy:  [facility] [address], [place] - [address] [place] [address] Phone: [phone] Fax: [phone]   Is this the correct pharmacy for this prescription? Yes If no, delete pharmacy and type the correct one.    Is the patient out of the medication? Yes  Has the patient been seen for an appointment in the last year OR does the patient have an upcoming appointment? Yes  Can we respond through MyChart? No  Agent: Please be advised that Rx refills may take up to 3 business days. We ask that you follow-up with your pharmacy.

## 2023-07-05 ENCOUNTER — Other Ambulatory Visit: Payer: Self-pay | Admitting: Family Medicine

## 2023-07-05 DIAGNOSIS — I5032 Chronic diastolic (congestive) heart failure: Secondary | ICD-10-CM

## 2023-07-05 DIAGNOSIS — I5042 Chronic combined systolic (congestive) and diastolic (congestive) heart failure: Secondary | ICD-10-CM

## 2023-07-14 ENCOUNTER — Other Ambulatory Visit: Payer: Self-pay

## 2023-07-14 ENCOUNTER — Other Ambulatory Visit

## 2023-07-15 ENCOUNTER — Other Ambulatory Visit: Payer: Self-pay

## 2023-08-05 ENCOUNTER — Other Ambulatory Visit: Payer: Self-pay | Admitting: Cardiology

## 2023-08-05 DIAGNOSIS — I4819 Other persistent atrial fibrillation: Secondary | ICD-10-CM

## 2023-08-05 NOTE — Telephone Encounter (Signed)
 Prescription refill request for Eliquis  received. Indication: AF Last office visit: 04/02/22  KATHEE Shallow MD Scr: 0.89 on 04/05/23  Epic Age: 78 Weight: 105.7kg  Based on above findings Eliquis  5mg  twice daily is the appropriate dose. Pt is apst due for MD appt.  Message sent to schedulers.  Refill approved 2.

## 2023-09-05 ENCOUNTER — Other Ambulatory Visit: Payer: Self-pay | Admitting: Family Medicine

## 2023-09-05 DIAGNOSIS — I5032 Chronic diastolic (congestive) heart failure: Secondary | ICD-10-CM

## 2023-09-05 DIAGNOSIS — I5042 Chronic combined systolic (congestive) and diastolic (congestive) heart failure: Secondary | ICD-10-CM

## 2023-10-05 ENCOUNTER — Other Ambulatory Visit: Payer: Self-pay | Admitting: Cardiology

## 2023-10-05 DIAGNOSIS — I4819 Other persistent atrial fibrillation: Secondary | ICD-10-CM

## 2023-10-05 NOTE — Telephone Encounter (Signed)
 Prescription refill request for Eliquis  received. Indication:afib Last office visit:needs appt Scr:na Age: 78 Weight:100.7  kg  Prescription refilled

## 2023-10-07 ENCOUNTER — Ambulatory Visit: Payer: Medicare Other

## 2023-10-07 VITALS — Ht 66.75 in | Wt 220.4 lb

## 2023-10-07 DIAGNOSIS — Z0000 Encounter for general adult medical examination without abnormal findings: Secondary | ICD-10-CM

## 2023-10-07 NOTE — Progress Notes (Signed)
 Subjective:   Briana Silva is a 78 y.o. female who presents for Medicare Annual (Subsequent) preventive examination.  Visit Complete: Virtual I connected with  Briana Silva on 10/07/23 by a audio enabled telemedicine application and verified that I am speaking with the correct person using two identifiers.  Patient Location: Home  Provider Location: Office/Clinic  I discussed the limitations of evaluation and management by telemedicine. The patient expressed understanding and agreed to proceed.  Vital Signs: Because this visit was a virtual/telehealth visit, some criteria may be missing or patient reported. Any vitals not documented were not able to be obtained and vitals that have been documented are patient reported.  Patient Medicare AWV questionnaire was completed by the patient on n/a; I have confirmed that all information answered by patient is correct and no changes since this date.  Cardiac Risk Factors include: advanced age (>69men, >63 women);sedentary lifestyle;dyslipidemia;hypertension;family history of premature cardiovascular disease     Objective:    Today's Vitals   10/07/23 1055  Weight: 220 lb 6.4 oz (100 kg)  Height: 5' 6.75 (1.695 m)   Body mass index is 34.78 kg/m.     10/07/2023   11:07 AM 10/06/2022   11:10 AM 09/30/2021   10:26 AM 09/28/2020    9:43 AM 06/20/2019    8:49 AM  Advanced Directives  Does Patient Have a Medical Advance Directive? Yes Yes No No No  Type of Advance Directive Healthcare Power of [employer] Power of Attorney     Does patient want to make changes to medical advance directive? No - Patient declined No - Patient declined     Copy of Healthcare Power of Attorney in Chart?  No - copy requested     Would patient like information on creating a medical advance directive?   No - Patient declined No - Patient declined No - Patient declined    Current Medications (verified) Outpatient Encounter Medications as of  10/07/2023  Medication Sig   albuterol  (PROAIR  HFA) 108 (90 Base) MCG/ACT inhaler Inhale 2 puffs into the lungs every 4 (four) hours as needed for wheezing or shortness of breath.   apixaban  (ELIQUIS ) 5 MG TABS tablet Take 1 tablet (5 mg total) by mouth 2 (two) times daily. NEEDS CARDIOLOGY APPT FOR REFILLS, CALL OFFICE [phone].  THANK YOU   carvedilol  (COREG ) 12.5 MG tablet TAKE 1 TABLET BY MOUTH TWICE DAILY WITH A MEAL   cetirizine  (ZYRTEC ) 10 MG tablet Take 1 tablet (10 mg total) by mouth daily as needed for allergies or rhinitis.   Cholecalciferol (QC VITAMIN D3) 50 MCG (2000 UT) TABS Take 1 tablet by mouth daily.   dapagliflozin  propanediol (FARXIGA ) 10 MG TABS tablet Take 1 tablet (10 mg total) by mouth daily. Copay covered by [employer]ret[employer]   digoxin  (LANOXIN ) 0.125 MG tablet Take 1 tablet by mouth once daily   ferrous sulfate 324 MG TBEC Take 324 mg by mouth.   fluticasone -salmeterol (ADVAIR) 250-50 MCG/ACT AEPB INHALE 1 DOSE BY MOUTH INTO THE LUNGS TWICE DAILY   furosemide  (LASIX ) 40 MG tablet Take 1 tablet (40 mg total) by mouth daily.   Omega-3 Fatty Acids (FISH OIL) 1000 MG CAPS Take 1 capsule by mouth daily.   pravastatin  (PRAVACHOL ) 40 MG tablet Take 1 tablet (40 mg total) by mouth at bedtime.   Prenatal Vit-Fe Fumarate-FA (PRENATAL MULTIVITAMIN) TABS tablet Take 1 tablet by mouth daily at 12 noon.   sacubitril -valsartan  (ENTRESTO ) 24-26 MG Take 1 tablet by mouth twice  daily   spironolactone  (ALDACTONE ) 25 MG tablet Take 1 tablet (25 mg total) by mouth daily.   [DISCONTINUED] doxycycline  (VIBRA -TABS) 100 MG tablet Take 1 tablet (100 mg total) by mouth daily. Start 2 days before arrival in the country and D/C 4 weeks after exposure   No facility-administered encounter medications on file as of 10/07/2023.    Allergies (verified) Metoprolol  and Dust mite mixed allergen ext [mite (d. farinae)]   History: Past Medical History:  Diagnosis Date   Abnormal echocardiogram  09/18/2016   Mild LVH, EF 55-65%, moderately dilated LA and RA   Allergy    Asthma    Asthma    Atrial tachycardia    Cardiomyopathy (HCC)    CHF (congestive heart failure) (HCC)    Depression    Hyperlipidemia    Hypertension    Obesity    Past Surgical History:  Procedure Laterality Date   LEFT OOPHORECTOMY  1967   TUBAL LIGATION  1967   Family History  Problem Relation Age of Onset   Hypertension Mother    Alzheimer's disease Mother    Cancer Father    Pancreatic cancer Father    Hypertension Father    Other Sister        brain tumor   Diabetes Brother    Non-Hodgkin's lymphoma Brother    Leukemia Brother    Cancer Daughter 22       Brain tumor   Social History   Socioeconomic History   Marital status: Widowed    Spouse name: Not on file   Number of children: 2   Years of education: 14   Highest education level: Some college, no degree  Occupational History   Occupation: Retired.   Tobacco Use   Smoking status: Never   Smokeless tobacco: Never  Vaping Use   Vaping status: Never Used  Substance and Sexual Activity   Alcohol use: No   Drug use: No   Sexual activity: Not on file  Other Topics Concern   Not on file  Social History Narrative   Lives alone. She had two children, one has deceased. She enjoys writing and doing puzzles. She did get a new puppy recently.   Social Drivers of Corporate investment banker Strain: Low Risk  (10/07/2023)   Overall Financial Resource Strain (CARDIA)    Difficulty of Paying Living Expenses: Not hard at all  Food Insecurity: No Food Insecurity (10/07/2023)   Hunger Vital Sign    Worried About Running Out of Food in the Last Year: Never true    Ran Out of Food in the Last Year: Never true  Transportation Needs: No Transportation Needs (10/07/2023)   PRAPARE - Administrator, Civil Service (Medical): No    Lack of Transportation (Non-Medical): No  Physical Activity: Insufficiently Active (10/07/2023)    Exercise Vital Sign    Days of Exercise per Week: 3 days    Minutes of Exercise per Session: 40 min  Stress: No Stress Concern Present (10/07/2023)   [employer] of Occupational Health - Occupational Stress Questionnaire    Feeling of Stress: Not at all  Social Connections: Socially Isolated (10/07/2023)   Social Connection and Isolation Panel    Frequency of Communication with Friends and Family: Twice a week    Frequency of Social Gatherings with Friends and Family: Never    Attends Religious Services: Never    Database administrator or Organizations: No    Attends Banker  Meetings: Never    Marital Status: Widowed    Tobacco Counseling Counseling given: Not Answered   Clinical Intake:  Pre-visit preparation completed: Yes  Pain : No/denies pain     BMI - recorded: 34.78 Nutritional Status: BMI > 30  Obese Nutritional Risks: None Diabetes: No  How often do you need to have someone help you when you read instructions, pamphlets, or other written materials from your doctor or pharmacy?: 1 - Never What is the last grade level you completed in school?: 14  Interpreter Needed?: No      Activities of Daily Living    10/07/2023   10:56 AM  In your present state of health, do you have any difficulty performing the following activities:  Hearing? 1  Vision? 0  Difficulty concentrating or making decisions? 0  Walking or climbing stairs? 1  Dressing or bathing? 0  Doing errands, shopping? 0  Preparing Food and eating ? N  Using the Toilet? N  In the past six months, have you accidently leaked urine? N  Do you have problems with loss of bowel control? N  Managing your Medications? N  Managing your Finances? N  Housekeeping or managing your Housekeeping? N    Patient Care Team: Alvan Briana BIRCH, MD as PCP - General (Family Medicine) Pietro Redell RAMAN, MD as PCP - Cardiology (Cardiology) Deanna Channing LABOR, [facility] (Pharmacist) Deanna Channing LABOR, [facility]  (Pharmacist) Lauro Shona LABOR, RN as Registered Nurse Gordy Channing LABOR, RN as VBCI Care Management  Indicate any recent Medical Services you may have received from other than [facility] providers in the past year (date may be approximate).     Assessment:   This is a routine wellness examination for Briana Silva.  Hearing/Vision screen No results found.   Goals Addressed             This Visit's Progress    Patient Stated       Patient states she would like to become more balanced with walking.        Depression Screen    10/07/2023   11:05 AM 05/06/2023   10:32 AM 04/29/2023   10:41 AM 04/21/2023   10:44 AM 10/06/2022   11:11 AM 06/03/2022   11:27 AM 12/03/2021   10:04 AM  PHQ 2/9 Scores  PHQ - 2 Score 1 0 0 0 1 1 0    Fall Risk    10/07/2023   11:07 AM 06/12/2023    8:31 PM 05/15/2023    3:48 PM 04/29/2023   10:34 AM 10/06/2022   11:11 AM  Fall Risk   Falls in the past year? 0 1 1 1  0  Comment    patient reports she fell in March after turning wrong - denied injury   Number falls in past yr: 0 0 0 0 0  Injury with Fall? 0 0 0 0 0  Risk for fall due to : Impaired mobility History of fall(s);Medication side effect;Impaired balance/gait History of fall(s);Impaired balance/gait History of fall(s);Impaired balance/gait No Fall Risks  Risk for fall due to: Comment    discussed fall prevention strategies   Follow up Falls evaluation completed Falls evaluation completed;Education provided;Falls prevention discussed Falls evaluation completed;Education provided;Falls prevention discussed Education provided;Falls prevention discussed Falls evaluation completed  Comment    patient states she prepares meals and freezes on good days so she can pull them out on days she feels her balance is off     MEDICARE RISK AT HOME: Medicare Risk  at Home Any stairs in or around the home?: Yes If so, are there any without handrails?: No Home free of loose throw rugs in walkways, pet beds, electrical  cords, etc?: Yes Adequate lighting in your home to reduce risk of falls?: Yes Life alert?: No Use of a cane, walker or w/c?: Yes Grab bars in the bathroom?: No Shower chair or bench in shower?: Yes Elevated toilet seat or a handicapped toilet?: Yes  TIMED UP AND GO:  Was the test performed?  No    Cognitive Function:        10/07/2023   11:09 AM 10/06/2022   11:19 AM 09/30/2021   10:40 AM 09/28/2020    9:56 AM 06/20/2019    8:47 AM  6CIT Screen  What Year? 0 points 0 points 0 points 0 points 0 points  What month? 0 points 0 points 0 points 0 points 0 points  What time? 0 points 0 points 0 points 0 points 0 points  Count back from 20 2 points 0 points 0 points 0 points 0 points  Months in reverse 0 points 0 points 0 points 0 points 0 points  Repeat phrase 0 points 0 points 0 points 0 points 4 points  Total Score 2 points 0 points 0 points 0 points 4 points    Immunizations Immunization History  Administered Date(s) Administered   Fluad Quad(high Dose 65+) 12/03/2019, 12/20/2020, 10/02/2021   Fluad Trivalent(High Dose 65+) 12/04/2022   Hep A / Hep B 05/25/2023, 06/01/2023   INFLUENZA, HIGH DOSE SEASONAL PF 09/25/2016, 12/01/2017   Influenza Split 10/18/2010, 09/26/2011   Influenza,inj,Quad PF,6+ Mos 01/10/2013, 09/08/2013, 09/22/2014, 09/27/2015   Moderna Sars-Covid-2 Vaccination 02/24/2019, 03/24/2019, 11/17/2019, 12/03/2019   Pfizer(Comirnaty)Fall Seasonal Vaccine 12 years and older 12/03/2021, 12/04/2022   Pneumococcal Conjugate-13 06/22/2014   Pneumococcal Polysaccharide-23 01/13/2009   Tdap 09/08/2013    TDAP status: Due, Education has been provided regarding the importance of this vaccine. Advised may receive this vaccine at local pharmacy or Health Dept. Aware to provide a copy of the vaccination record if obtained from local pharmacy or Health Dept. Verbalized acceptance and understanding.  Flu Vaccine status: Due, Education has been provided regarding the importance  of this vaccine. Advised may receive this vaccine at local pharmacy or Health Dept. Aware to provide a copy of the vaccination record if obtained from local pharmacy or Health Dept. Verbalized acceptance and understanding.  Pneumococcal vaccine status: Due, Education has been provided regarding the importance of this vaccine. Advised may receive this vaccine at local pharmacy or Health Dept. Aware to provide a copy of the vaccination record if obtained from local pharmacy or Health Dept. Verbalized acceptance and understanding.  Covid-19 vaccine status: Information provided on how to obtain vaccines.   Qualifies for Shingles Vaccine? Yes   Zostavax completed No   Shingrix Completed?: No.    Education has been provided regarding the importance of this vaccine. Patient has been advised to call insurance company to determine out of pocket expense if they have not yet received this vaccine. Advised may also receive vaccine at local pharmacy or Health Dept. Verbalized acceptance and understanding.  Screening Tests Health Maintenance  Topic Date Due   Zoster Vaccines- Shingrix (1 of 2) Never done   Influenza Vaccine  08/14/2023   DTaP/Tdap/Td (2 - Td or Tdap) 09/09/2023   COVID-19 Vaccine (7 - Moderna risk 2024-25 season) 09/14/2023   DEXA SCAN  11/29/2023   Medicare Annual Wellness (AWV)  10/06/2024   Pneumococcal  Vaccine: 50+ Years  Completed   HPV VACCINES  Aged Out   Meningococcal B Vaccine  Aged Out   Hepatitis B Vaccines 19-59 Average Risk  Discontinued   Mammogram  Discontinued   Colonoscopy  Discontinued   Hepatitis C Screening  Discontinued    Health Maintenance  Health Maintenance Due  Topic Date Due   Zoster Vaccines- Shingrix (1 of 2) Never done   Influenza Vaccine  08/14/2023   DTaP/Tdap/Td (2 - Td or Tdap) 09/09/2023   COVID-19 Vaccine (7 - Moderna risk 2024-25 season) 09/14/2023    Colorectal cancer screening: No longer required.   Mammogram status: No longer required  due to age.  Bone Density status: Completed 11/28/2020. Results reflect: Bone density results: OSTEOPENIA. Repeat every 3 years.  Lung Cancer Screening: (Low Dose CT Chest recommended if Age 45-80 years, 20 pack-year currently smoking OR have quit w/in 15years.) does not qualify.   Lung Cancer Screening Referral: n/a  Additional Screening:  Hepatitis C Screening: does not qualify; Completed 06/20/2019  Vision Screening: Recommended annual ophthalmology exams for early detection of glaucoma and other disorders of the eye. Is the patient up to date with their annual eye exam?  Yes  Who is the provider or what is the name of the office in which the patient attends annual eye exams? [facility] If pt is not established with a provider, would they like to be referred to a provider to establish care? N/a.   Dental Screening: Recommended annual dental exams for proper oral hygiene   Community Resource Referral / Chronic Care Management: CRR required this visit?  No   CCM required this visit?  No     Plan:     I have personally reviewed and noted the following in the patient's chart:   Medical and social history Use of alcohol, tobacco or illicit drugs  Current medications and supplements including opioid prescriptions. Patient is not currently taking opioid prescriptions. Functional ability and status Nutritional status Physical activity Advanced directives List of other physicians Hospitalizations, surgeries, and ER visits in previous 12 months. None Vitals Screenings to include cognitive, depression, and falls Referrals and appointments  In addition, I have reviewed and discussed with patient certain preventive protocols, quality metrics, and best practice recommendations. A written personalized care plan for preventive services as well as general preventive health recommendations were provided to patient.     Briana Silva, Briana Silva   10/07/2023   After Visit Summary: (Mail)  Due to this being a telephonic visit, the after visit summary with patients personalized plan was offered to patient via mail   Nurse Notes:    Briana Silva is a 78 y.o. female patient of Metheney, Briana BIRCH, MD who had a Medicare Annual Wellness Visit today via telephone. Briana Silva is Retired and lives alone. She has one living child. She reports that she is socially active and does interact with friends/family regularly. She is minimally physically active and enjoys writing and ding puzzles.

## 2023-10-07 NOTE — Patient Instructions (Signed)
 Briana Silva , Thank you for taking time to come for your Medicare Wellness Visit. I appreciate your ongoing commitment to your health goals. Please review the following plan we discussed and let me know if I can assist you in the future.   These are the goals we discussed:  Goals       Medication Management      Patient Goals/Self-Care Activities Over the next 180 days, patient will:  take medications as prescribed and work with provider on medication access solutions  Follow Up Plan: Telephone follow up appointment with care management team member scheduled for:  6-8 months      Patient Stated (pt-stated)      To loose 10 lbs.      Patient Stated (pt-stated)      09/30/2021 AWV Goal: Exercise for General Health  Patient will verbalize understanding of the benefits of increased physical activity: Exercising regularly is important. It will improve your overall fitness, flexibility, and endurance. Regular exercise also will improve your overall health. It can help you control your weight, reduce stress, and improve your bone density. Over the next year, patient will increase physical activity as tolerated with a goal of at least 150 minutes of moderate physical activity per week.  You can tell that you are exercising at a moderate intensity if your heart starts beating faster and you start breathing faster but can still hold a conversation. Moderate-intensity exercise ideas include: Walking 1 mile (1.6 km) in about 15 minutes Biking Hiking Golfing Dancing Water aerobics Patient will verbalize understanding of everyday activities that increase physical activity by providing examples like the following: Yard work, such as: Insurance underwriter Gardening Washing windows or floors Patient will be able to explain general safety guidelines for exercising:  Before you start a new exercise program, talk with  your health care provider. Do not exercise so much that you hurt yourself, feel dizzy, or get very short of breath. Wear comfortable clothes and wear shoes with good support. Drink plenty of water while you exercise to prevent dehydration or heat stroke. Work out until your breathing and your heartbeat get faster.       Patient Stated (pt-stated)      Patient stated that she would like to be able to work out.       Patient Stated      Patient states she would like to become more balanced with walking.       VBCI RN Care Plan      Problems:  Chronic Disease Management support and education needs related to Atrial Fibrillation and CHF  Goal: Over the next 4 months the Patient will continue to work with RN Care Manager and/or Social Worker to address care management and care coordination needs related to Atrial Fibrillation and CHF as evidenced by adherence to care management team scheduled appointments      Interventions:   AFIB Interventions:   Reviewed importance of adherence to anticoagulant exactly as prescribed Counseled on bleeding risk associated with Eliquis  and importance of self-monitoring for signs/symptoms of bleeding Counseled on avoidance of NSAIDs due to increased bleeding risk with anticoagulants Counseled on seeking medical attention after a head injury or if there is blood in the urine/stool Afib action plan reviewed Assessed social determinant of health barriers   Heart Failure Interventions: Basic overview and discussion of pathophysiology of Heart Failure reviewed Provided education on low sodium  diet Reviewed Heart Failure Action Plan in depth and provided written copy Assessed need for readable accurate scales in home Advised patient to weigh each morning after emptying bladder Discussed importance of daily weight and advised patient to weigh and record daily Reviewed role of diuretics in prevention of fluid overload and management of heart  failure; Discussed the importance of keeping all appointments with provider  Patient Self-Care Activities:  Attend all scheduled provider appointments Call pharmacy for medication refills 3-7 days in advance of running out of medications Call provider office for new concerns or questions  Perform all self care activities independently  Perform IADL's (shopping, preparing meals, housekeeping, managing finances) independently Take medications as prescribed   Work with the pharmacist to address medication management needs and will continue to work with the clinical team to address health care and disease management related needs call office if I gain more than 2 pounds in one day or 5 pounds in one week track weight in diary watch for swelling in feet, ankles and legs every day weigh myself daily develop a rescue plan follow rescue plan if symptoms flare-up begin a symptom diary bring symptom diary to all appointments check pulse (heart) rate once a day take medicine as prescribed  Plan:  The patient has been provided with contact information for the care management team and has been advised to call with any health related questions or concerns.  Next appointment with RNCM scheduled for 07/14/23 @ 2pm after patient returns from her trip to [place].             This is a list of the screening recommended for you and due dates:  Health Maintenance  Topic Date Due   Zoster (Shingles) Vaccine (1 of 2) Never done   Flu Shot  08/14/2023   DTaP/Tdap/Td vaccine (2 - Td or Tdap) 09/09/2023   COVID-19 Vaccine (7 - Moderna risk 2024-25 season) 09/14/2023   DEXA scan (bone density measurement)  11/29/2023   Medicare Annual Wellness Visit  10/06/2024   Pneumococcal Vaccine for age over 30  Completed   HPV Vaccine  Aged Out   Meningitis B Vaccine  Aged Out   Hepatitis B Vaccine  Discontinued   Breast Cancer Screening  Discontinued   Colon Cancer Screening  Discontinued   Hepatitis C  Screening  Discontinued

## 2023-10-09 ENCOUNTER — Other Ambulatory Visit: Payer: Self-pay

## 2023-10-09 ENCOUNTER — Other Ambulatory Visit: Payer: Self-pay | Admitting: Family Medicine

## 2023-10-09 DIAGNOSIS — J4531 Mild persistent asthma with (acute) exacerbation: Secondary | ICD-10-CM

## 2023-10-09 DIAGNOSIS — R600 Localized edema: Secondary | ICD-10-CM

## 2023-10-09 DIAGNOSIS — I5023 Acute on chronic systolic (congestive) heart failure: Secondary | ICD-10-CM

## 2023-10-15 NOTE — Telephone Encounter (Signed)
 Pt will need OV and labs for refills. Plz call to schedule. TY

## 2023-10-22 ENCOUNTER — Encounter: Payer: Self-pay | Admitting: Family Medicine

## 2023-10-22 ENCOUNTER — Ambulatory Visit: Admitting: Family Medicine

## 2023-10-22 VITALS — BP 125/78 | HR 96 | Ht 66.0 in | Wt 219.0 lb

## 2023-10-22 DIAGNOSIS — I10 Essential (primary) hypertension: Secondary | ICD-10-CM

## 2023-10-22 DIAGNOSIS — Z23 Encounter for immunization: Secondary | ICD-10-CM | POA: Diagnosis not present

## 2023-10-22 DIAGNOSIS — I5042 Chronic combined systolic (congestive) and diastolic (congestive) heart failure: Secondary | ICD-10-CM | POA: Diagnosis not present

## 2023-10-22 DIAGNOSIS — J4540 Moderate persistent asthma, uncomplicated: Secondary | ICD-10-CM | POA: Diagnosis not present

## 2023-10-22 DIAGNOSIS — I5023 Acute on chronic systolic (congestive) heart failure: Secondary | ICD-10-CM

## 2023-10-22 DIAGNOSIS — R7301 Impaired fasting glucose: Secondary | ICD-10-CM | POA: Diagnosis not present

## 2023-10-22 DIAGNOSIS — R2681 Unsteadiness on feet: Secondary | ICD-10-CM

## 2023-10-22 DIAGNOSIS — R600 Localized edema: Secondary | ICD-10-CM

## 2023-10-22 MED ORDER — FLUTICASONE-SALMETEROL 100-50 MCG/ACT IN AEPB: 1.0000 | INHALATION_SPRAY | Freq: Two times a day (BID) | RESPIRATORY_TRACT | 0 refills | Status: AC

## 2023-10-22 MED ORDER — FUROSEMIDE 40 MG PO TABS: 40.0000 mg | ORAL_TABLET | Freq: Every day | ORAL | 1 refills | Status: AC

## 2023-10-22 NOTE — Assessment & Plan Note (Signed)
 Fairly stable although she has had a slight increase in symptoms over the last week along with some nasal congestion and feeling like her ears are popping and clogging.  Continue with oral send Zyrtec .  Did refill Advair today.

## 2023-10-22 NOTE — Assessment & Plan Note (Signed)
 No change, stable continue to work under healthy diet.

## 2023-10-22 NOTE — Assessment & Plan Note (Signed)
 Stable.  No sign of volume overload.  Continue Entresto , spironolactone , Farxiga  and furosemide .  Blood pressure looks great today.  She is also on digoxin  prescribed by Dr. Pietro.

## 2023-10-22 NOTE — Progress Notes (Signed)
 "  Established Patient Office Visit  Subjective  Patient ID: Briana Silva, female    DOB: Sep 29, 1945  Age: 78 y.o. MRN: [id number]  Chief Complaint  Patient presents with   ifg   Asthma   Hypertension    HPI  She is here today for follow-up and doing really well she has a diagnosis of combined systolic and diastolic heart failure she has not noticed any volume overload.  Blood pressures have actually been really well-controlled.  She actually went to [place] for a trip this summer and actually had a wonderful time.  She has been taking blood pressure medications regularly.  She wishes she could get off of some of her medications.  Moderate persistent asthma-she has been using her Advair and does need a refill today she says she has been doing pretty well most of the summer but feels like over the last week or so she has had just a little bit more congestion.  A little bit more nasal congestion and ears popping.  She does take Zyrtec  daily and says that has not changed.  She still struggles with her gait and feeling more wobbly.  She says she would like to get stronger and eventually go to the [facility].   Discussed the use of AI scribe software for clinical note transcription with the patient, who gave verbal consent to proceed.  History of Present Illness Briana Silva is an 78 year old female with heart disease who presents for medication management and evaluation of breathing difficulties.  Dyspnea and congestion - Increased congestion and breathing difficulties over the past week - No recent wheezing, though had some previously and managed it proactively - Not currently taking Zyrtec  - Has used Symbicort in the past and discussed refills - Took Lasix  last night, which may affect today's blood work  Polypharmacy - Desires to reduce medication burden, describing current regimen as 'two handfuls a day'  Balance disturbance - Ongoing issues with balance, described as  'wobbling' - Not engaging in exercises that might help balance - Continues to drive herself - Open to physical therapy for balance training  Immunization reactions - Received influenza vaccine today with soreness at the injection site - Has received the first part of the shingles vaccine      ROS    Objective:     BP 125/78   Pulse 96   Ht 5' 6 (1.676 m)   Wt 219 lb (99.3 kg)   SpO2 98%   BMI 35.35 kg/m    Physical Exam Vitals and nursing note reviewed.  Constitutional:      Appearance: Normal appearance.  HENT:     Head: Normocephalic and atraumatic.  Eyes:     Conjunctiva/sclera: Conjunctivae normal.  Cardiovascular:     Rate and Rhythm: Normal rate and regular rhythm.  Pulmonary:     Effort: Pulmonary effort is normal.     Breath sounds: Wheezing present.     Comments: Slight wheeze at the bases bilatearlly  Skin:    General: Skin is warm and dry.  Neurological:     Mental Status: She is alert.  Psychiatric:        Mood and Affect: Mood normal.      Results for orders placed or performed in visit on 10/22/23  HgB A1c  Result Value Ref Range   Hgb A1c MFr Bld 5.7 (H) 4.8 - 5.6 %   Est. average glucose Bld gHb Est-mCnc 117 mg/dL  Lipid Panel With  LDL/HDL Ratio  Result Value Ref Range   Cholesterol, Total 197 100 - 199 mg/dL   Triglycerides 63 0 - 149 mg/dL   HDL 72 >60 mg/dL   VLDL Cholesterol Cal 12 5 - 40 mg/dL   LDL Chol Calc (NIH) 886 (H) 0 - 99 mg/dL   LDL/HDL Ratio 1.6 0.0 - 3.2 ratio  CMP14+EGFR  Result Value Ref Range   Glucose 90 70 - 99 mg/dL   BUN 19 8 - 27 mg/dL   Creatinine, Ser 8.88 (H) 0.57 - 1.00 mg/dL   eGFR 50 (L) >40 fO/fpw/8.26   BUN/Creatinine Ratio 17 12 - 28   Sodium 138 134 - 144 mmol/L   Potassium 4.6 3.5 - 5.2 mmol/L   Chloride 95 (L) 96 - 106 mmol/L   CO2 29 20 - 29 mmol/L   Calcium 9.7 8.7 - 10.3 mg/dL   Total Protein 7.3 6.0 - 8.5 g/dL   Albumin 4.1 3.8 - 4.8 g/dL   Globulin, Total 3.2 1.5 - 4.5 g/dL    Bilirubin Total 0.7 0.0 - 1.2 mg/dL   Alkaline Phosphatase 114 49 - 135 IU/L   AST 24 0 - 40 IU/L   ALT 14 0 - 32 IU/L      The ASCVD Risk score (Arnett DK, et al., 2019) failed to calculate for the following reasons:   The 2019 ASCVD risk score is only valid for ages 33 to 75    Assessment & Plan:   Problem List Items Addressed This Visit       Cardiovascular and Mediastinum   Essential hypertension   Relevant Medications   furosemide  (LASIX ) 40 MG tablet   Other Relevant Orders   HgB A1c (Completed)   Lipid Panel With LDL/HDL Ratio (Completed)   CMP14+EGFR (Completed)   Chronic combined systolic and diastolic heart failure (HCC)   Stable.  No sign of volume overload.  Continue Entresto , spironolactone , Farxiga  and furosemide .  Blood pressure looks great today.  She is also on digoxin  prescribed by Dr. Pietro.      Relevant Medications   furosemide  (LASIX ) 40 MG tablet   Other Relevant Orders   HgB A1c (Completed)   Lipid Panel With LDL/HDL Ratio (Completed)   CMP14+EGFR (Completed)     Respiratory   Moderate persistent asthma   Fairly stable although she has had a slight increase in symptoms over the last week along with some nasal congestion and feeling like her ears are popping and clogging.  Continue with oral send Zyrtec .  Did refill Advair today.      Relevant Medications   fluticasone -salmeterol (ADVAIR) 100-50 MCG/ACT AEPB     Endocrine   IFG (impaired fasting glucose)   Relevant Orders   HgB A1c (Completed)   Lipid Panel With LDL/HDL Ratio (Completed)   CMP14+EGFR (Completed)     Other   Severe obesity (BMI 35.0-39.9) with comorbidity (HCC)   No change, stable continue to work under healthy diet.       Peripheral edema   Relevant Medications   furosemide  (LASIX ) 40 MG tablet   Other Visit Diagnoses       Encounter for immunization    -  Primary   Relevant Orders   Flu vaccine HIGH DOSE PF(Fluzone Trivalent) (Completed)     Acute on chronic  systolic congestive heart failure (HCC)       Relevant Medications   furosemide  (LASIX ) 40 MG tablet     Gait instability  Relevant Orders   Ambulatory referral to Physical Therapy      Gait instability-will place formal referral for PT think they could be really helpful for her to get stronger she does still try if so she feels like transportation would not be an issue.  Assessment and Plan Assessment & Plan Asthma with recent exacerbation Recent exacerbation of mild persistent asthma with increased congestion and slight wheezing. No recent wheezing reported. - Ensure refill of Symbicort inhaler. - Advise use of albuterol  inhaler at home. - Instruct to report if symptoms worsen.  Gait instability Persistent gait instability with no recent improvement. Physical therapy discussed for balance training and lower body strengthening to reduce fall risk. - Refer to physical therapy for balance training and gait safety. - Encourage participation in exercises at home and at the [facility].  Chronic combined systolic and diastolic heart failure Symptoms of possible heart failure exacerbation, including chest tightness or fullness and some problems with breathing and congestion in the last week. - Order blood work to assess kidney function and medication interactions.  Essential hypertension No specific discussion of hypertension management in this encounter.  General Health Maintenance Received flu shot. Due for tetanus booster, covered by Medicare. Received first dose of shingles vaccine, needs second dose. Advised not to receive shingles and tetanus vaccines on the same day. - Advise to get tetanus booster at the pharmacy. - Instruct to get the second dose of the shingles vaccine, but not on the same day as the tetanus booster.    Return in about 6 months (around 04/21/2024) for Hypertension.     Dorothyann Byars, MD "

## 2023-10-23 ENCOUNTER — Encounter: Payer: Self-pay | Admitting: Family Medicine

## 2023-10-23 ENCOUNTER — Ambulatory Visit: Payer: Self-pay | Admitting: Family Medicine

## 2023-10-23 LAB — LIPID PANEL WITH LDL/HDL RATIO
Cholesterol, Total: 197 mg/dL (ref 100–199)
HDL: 72 mg/dL (ref >39)
LDL Chol Calc (NIH): 113 mg/dL — ABNORMAL HIGH (ref 0–99)
LDL/HDL Ratio: 1.6 ratio (ref 0.0–3.2)
Triglycerides: 63 mg/dL (ref 0–149)
VLDL Cholesterol Cal: 12 mg/dL (ref 5–40)

## 2023-10-23 LAB — CMP14+EGFR
ALT: 14 IU/L (ref 0–32)
AST: 24 IU/L (ref 0–40)
Albumin: 4.1 g/dL (ref 3.8–4.8)
Alkaline Phosphatase: 114 IU/L (ref 49–135)
BUN/Creatinine Ratio: 17 (ref 12–28)
BUN: 19 mg/dL (ref 8–27)
Bilirubin Total: 0.7 mg/dL (ref 0.0–1.2)
CO2: 29 mmol/L (ref 20–29)
Calcium: 9.7 mg/dL (ref 8.7–10.3)
Chloride: 95 mmol/L — ABNORMAL LOW (ref 96–106)
Creatinine, Ser: 1.11 mg/dL — ABNORMAL HIGH (ref 0.57–1.00)
Globulin, Total: 3.2 g/dL (ref 1.5–4.5)
Glucose: 90 mg/dL (ref 70–99)
Potassium: 4.6 mmol/L (ref 3.5–5.2)
Sodium: 138 mmol/L (ref 134–144)
Total Protein: 7.3 g/dL (ref 6.0–8.5)
eGFR: 50 mL/min/1.73 — ABNORMAL LOW (ref >59)

## 2023-10-23 LAB — HEMOGLOBIN A1C
Est. average glucose Bld gHb Est-mCnc: 117 mg/dL
Hgb A1c MFr Bld: 5.7 % — ABNORMAL HIGH (ref 4.8–5.6)

## 2023-10-23 NOTE — Progress Notes (Signed)
 Call patient: A1c looks great at 5.7 even better than last time.  LDL cholesterol still a little elevated.  Kidney function normally is around 0.9-1.  So right now stable.  Though she looked a little bit more dry on her blood work but I know she said she had taken her diuretic the night before so that is probably why.  Liver function looks great.

## 2023-10-27 ENCOUNTER — Ambulatory Visit: Attending: Family Medicine | Admitting: Physical Therapy

## 2023-10-27 ENCOUNTER — Encounter: Payer: Self-pay | Admitting: Physical Therapy

## 2023-10-27 ENCOUNTER — Other Ambulatory Visit: Payer: Self-pay

## 2023-10-27 DIAGNOSIS — R262 Difficulty in walking, not elsewhere classified: Secondary | ICD-10-CM | POA: Diagnosis present

## 2023-10-27 DIAGNOSIS — R2681 Unsteadiness on feet: Secondary | ICD-10-CM | POA: Insufficient documentation
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
## 2023-10-29 ENCOUNTER — Ambulatory Visit

## 2023-10-29 DIAGNOSIS — R262 Difficulty in walking, not elsewhere classified: Secondary | ICD-10-CM

## 2023-10-29 DIAGNOSIS — R2681 Unsteadiness on feet: Secondary | ICD-10-CM
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
## 2023-10-31 ENCOUNTER — Other Ambulatory Visit: Payer: Self-pay | Admitting: Family Medicine

## 2023-10-31 DIAGNOSIS — I5042 Chronic combined systolic (congestive) and diastolic (congestive) heart failure: Secondary | ICD-10-CM

## 2023-10-31 DIAGNOSIS — I5032 Chronic diastolic (congestive) heart failure: Secondary | ICD-10-CM

## 2023-11-02 ENCOUNTER — Ambulatory Visit

## 2023-11-02 DIAGNOSIS — R262 Difficulty in walking, not elsewhere classified: Secondary | ICD-10-CM

## 2023-11-02 DIAGNOSIS — R2681 Unsteadiness on feet: Secondary | ICD-10-CM
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
## 2023-11-05 ENCOUNTER — Ambulatory Visit

## 2023-11-06 ENCOUNTER — Other Ambulatory Visit: Payer: Self-pay

## 2023-11-10 ENCOUNTER — Ambulatory Visit: Admitting: Physical Therapy

## 2023-11-12 ENCOUNTER — Ambulatory Visit

## 2023-11-12 DIAGNOSIS — R262 Difficulty in walking, not elsewhere classified: Secondary | ICD-10-CM | POA: Diagnosis not present

## 2023-11-12 DIAGNOSIS — R2681 Unsteadiness on feet: Secondary | ICD-10-CM
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
## 2023-11-17 ENCOUNTER — Ambulatory Visit: Admitting: Physical Therapy

## 2023-11-19 ENCOUNTER — Telehealth: Payer: Self-pay | Admitting: Primary Care

## 2023-11-19 ENCOUNTER — Ambulatory Visit

## 2023-11-19 NOTE — Telephone Encounter (Signed)
 LVM to schedule wellness for this year 2025.

## 2023-11-25 ENCOUNTER — Ambulatory Visit: Attending: Family Medicine | Admitting: Physical Therapy

## 2023-11-25 DIAGNOSIS — R2681 Unsteadiness on feet: Secondary | ICD-10-CM | POA: Insufficient documentation

## 2023-11-25 DIAGNOSIS — R262 Difficulty in walking, not elsewhere classified: Secondary | ICD-10-CM | POA: Insufficient documentation

## 2023-12-03 ENCOUNTER — Ambulatory Visit

## 2024-02-10 ENCOUNTER — Telehealth: Payer: Self-pay | Admitting: Family Medicine

## 2024-02-10 NOTE — Telephone Encounter (Signed)
 Can you please start a PA for Briana Silva for Entresto  Tab 24-26 mg. Thank you.

## 2024-02-11 ENCOUNTER — Other Ambulatory Visit (HOSPITAL_COMMUNITY): Payer: Self-pay

## 2024-02-11 NOTE — Telephone Encounter (Signed)
 Pharmacy Patient Advocate Encounter   Received notification from Physician's Office that prior authorization for ENTRESTO  is required/requested.   Insurance verification completed.   The patient is insured through [employer].   Per test claim: Refill too soon. PA is not needed at this time. Medication was filled 02/05/24. Next eligible fill date is 02/28/24.

## 2024-02-18 NOTE — Telephone Encounter (Signed)
 This has been addressed.

## 2024-04-21 ENCOUNTER — Ambulatory Visit: Admitting: Family Medicine

## 2024-10-11 ENCOUNTER — Ambulatory Visit

## 7877-09-13 DEATH — deceased
